# Patient Record
Sex: Male | Born: 1980 | Race: White | Hispanic: No | Marital: Single | Smoking: Never smoker
Health system: Southern US, Community
[De-identification: ages and names within clinical notes are randomized; demographics above are authoritative.]

## PROBLEM LIST (undated history)

## (undated) DIAGNOSIS — I119 Hypertensive heart disease without heart failure: Secondary | ICD-10-CM

## (undated) DIAGNOSIS — I5020 Unspecified systolic (congestive) heart failure: Secondary | ICD-10-CM

## (undated) DIAGNOSIS — I10 Essential (primary) hypertension: Secondary | ICD-10-CM

## (undated) HISTORY — PX: SPIGELIAN HERNIA: SHX6100

## (undated) HISTORY — PX: TONSILLECTOMY AND ADENOIDECTOMY: SHX28

## (undated) HISTORY — DX: Hypertensive heart disease without heart failure: I11.9

## (undated) HISTORY — DX: Unspecified systolic (congestive) heart failure: I50.20

## (undated) HISTORY — DX: Essential (primary) hypertension: I10

---

## 2010-01-12 ENCOUNTER — Emergency Department (HOSPITAL_COMMUNITY): Admission: EM | Admit: 2010-01-12 | Discharge: 2010-01-12 | Payer: Self-pay | Admitting: Emergency Medicine

## 2010-01-12 ENCOUNTER — Encounter: Payer: Self-pay | Admitting: Orthopedic Surgery

## 2010-01-12 IMAGING — CR DG FINGER THUMB 2+V*R*
1 series · 1 of 1 positions shown · non-contrast
Comparison: None.

CLINICAL DATA: Pain following injury [DATE]

RIGHT THUMB 2+V

[view not recorded]
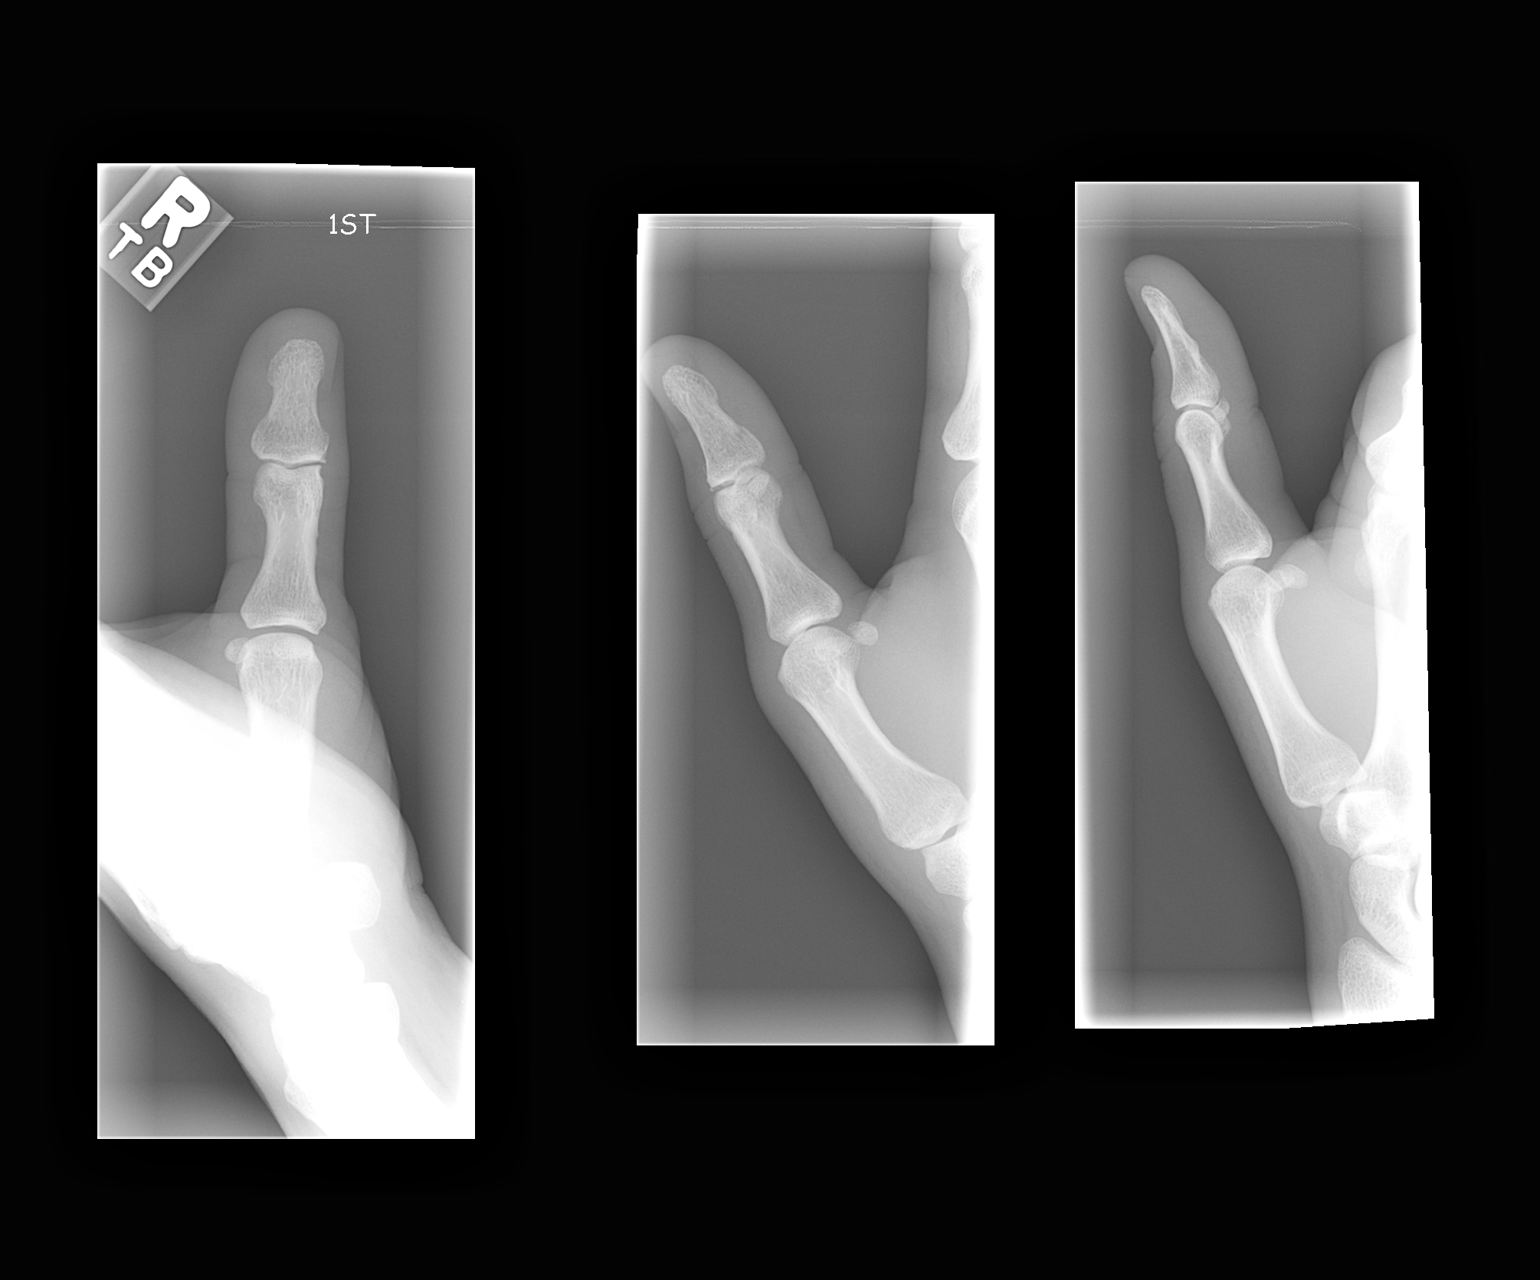

[1 of 1 positions shown; findings below may reference images not displayed]

FINDINGS: No definite fracture or dislocation.  On the oblique
view, there is a possible tiny avulsion off of the base of the
distal phalanx.  This needs clinical correlation.
IMPRESSION: Probably normal but cannot entirely exclude tiny avulsion off the
base of the distal phalanx.  See report.

## 2010-01-17 ENCOUNTER — Ambulatory Visit: Payer: Self-pay | Admitting: Orthopedic Surgery

## 2010-01-17 DIAGNOSIS — S63659A Sprain of metacarpophalangeal joint of unspecified finger, initial encounter: Secondary | ICD-10-CM

## 2010-11-22 NOTE — Assessment & Plan Note (Signed)
Summary: ER/Fractured thumb/xrays ap/self pay/frs   Vital Signs:  Patient profile:   30 year old male Height:      72 inches Weight:      192 pounds Pulse rate:   76 / minute Resp:     16 per minute  Vitals Entered By: Fuller Canada MD (January 17, 2010 8:42 AM)  Visit Type:  new patient Referring Provider:  ap er Primary Provider:  Dr. Arnold Long  CC:  right thumb fracture.  History of Present Illness: Ricardo Evans, 30 years old. Chief complaint pain, RIGHT thumb,. Patient was injured on [address] .basketball. Someone ran into his thumb complains of pain in the interphalangeal joint, as well as the ulnar collateral ligament, which is sharp throbbing constant. He complains of some numbness and tingling and swelling.    Xrays [facility] 01/12/10.  Meds: Norco 5 number 20 from er 01/12/10.  Allergies (verified): 1)  ! Penicillin 2)  ! Sulfa 3)  ! Keflex  Past History:  Past Medical History: na  Past Surgical History: hernia tonsils  Family History: Family History Coronary Heart Disease male < 69  Social History: Patient is single.  student no smoking alcohol occasionally caffeine occasionally  Review of Systems Constitutional:  Denies weight loss, weight gain, fever, chills, and fatigue. Cardiovascular:  Denies chest pain, palpitations, fainting, and murmurs. Respiratory:  Denies short of breath, wheezing, couch, tightness, pain on inspiration, and snoring . Gastrointestinal:  Denies heartburn, nausea, vomiting, diarrhea, constipation, and blood in your stools. Genitourinary:  Denies frequency, urgency, difficulty urinating, painful urination, flank pain, and bleeding in urine. Neurologic:  Denies numbness, tingling, unsteady gait, dizziness, tremors, and seizure. Musculoskeletal:  Complains of joint pain, swelling, and stiffness; denies instability, redness, heat, and muscle pain. Endocrine:  Denies excessive thirst, exessive urination, and heat or cold  intolerance. Psychiatric:  Denies nervousness, depression, anxiety, and hallucinations. Skin:  Denies changes in the skin, poor healing, rash, itching, and redness. HEENT:  Denies blurred or double vision, eye pain, redness, and watering. Immunology:  Denies seasonal allergies, sinus problems, and allergic to bee stings. Hemoatologic:  Denies easy bleeding and brusing.  Physical Exam  Additional Exam:  exam vital signs are normal.  His appearance was normal.  RIGHT thumb examination she reveals tenderness at the interphalangeal joint and tenderness over the ulnar collateral ligament, and loss of motion in the metacarpophalangeal joint. Decreased motion in the interphalangeal joint. The ligament is stable. Flexion power is normal. Pinch strength is diminished.  Sensation is normal, pulses, normal in the wrist, and capillary refill was normal in  the digit     Impression & Recommendations:  Problem # 1:  SPRAIN AND STRAIN OF METACARPOPHALANGEAL OF HAND (ICD-842.12) Assessment New x-rays were taken at the hospital, shows a small, tiny fracture at the margin of the IP joint, question old or new but does correlate with some tenderness in this area suggesting fracture.  Injuries 93 weeks old. I think motion is more important immobilization at this point, recommend active range of motion exercises limit contact activity. Orders: New Patient Level II ([id number])  Medications Added to Medication List This Visit: 1)  Ibuprofen 800 Mg Tabs (Ibuprofen) .Marland Kitchen.. 1 by mouth three times a day 2)  Norco 5-325 Mg Tabs (Hydrocodone-acetaminophen) .Marland Kitchen.. 1 by mouth q 4 as needed pain  Patient Instructions: 1)  Ice it 2 x a day  2)  move the joint as much as possible  3)  Take norco and ibuprofen as needed  4)  dont play any sports for 2-4 weeks (until pain and swelling resolve)  5)  No weight lifting x 4 weeks  6)  Please schedule a follow-up appointment as needed. Prescriptions: NORCO 5-325 MG TABS  (HYDROCODONE-ACETAMINOPHEN) 1 by mouth q 4 as needed pain  #42 x 1   Entered and Authorized by:   Fuller Canada MD   Signed by:   Fuller Canada MD on 01/17/2010   Method used:   Print then Give to Patient   RxID:   [id number] IBUPROFEN 800 MG TABS (IBUPROFEN) 1 by mouth three times a day  #90 x 0   Entered and Authorized by:   Fuller Canada MD   Signed by:   Fuller Canada MD on 01/17/2010   Method used:   Print then Give to Patient   RxID:   [id number]

## 2010-11-22 NOTE — Letter (Signed)
Summary: Out of [facility]  [address]  [place], [place] [address]   Phone: [phone]  Fax: [phone]    January 17, 2010   Student:  Adelene Amas [place]    To Whom It May Concern:   For Medical reasons, please excuse the above named student from school for the following dates:  Start:   January 17, 2010  End/Return to class:    January 17, 2010, following 8:30AM appointment in our office today.   If you need additional information, please feel free to contact our office.   Sincerely,    Terrance Mass, MD    ****This is a legal document and cannot be tampered with.  Schools are authorized to verify all information and to do so accordingly.

## 2010-11-22 NOTE — Letter (Signed)
Summary: Out of PE  [facility]  [address]  [place], [place] [address]   Phone: [phone]  Fax: [phone]    January 17, 2010   Student:  Adelene Amas [place]    To Whom It May Concern:   For Medical reasons, please excuse the above named student from attending sports, including weight lifting for:  4 weeks from the above date. (through February 14, 2010)  If you need additional information, please feel free to contact our office.  Sincerely,    Terrance Mass MD   ****This is a legal document and cannot be tampered with.  Schools are authorized to verify all information and to do so accordingly.

## 2010-11-22 NOTE — Letter (Signed)
Summary: History form  History form   Imported By: Jacklynn Ganong 01/18/2010 14:52:30  _____________________________________________________________________  External Attachment:    Type:   Image     Comment:   External Document

## 2018-04-02 ENCOUNTER — Ambulatory Visit (HOSPITAL_COMMUNITY)
Admission: RE | Admit: 2018-04-02 | Discharge: 2018-04-02 | Disposition: A | Discharge status: Home / Self Care | Payer: Self-pay | Attending: Psychiatry | Admitting: Psychiatry

## 2018-04-02 NOTE — H&P (Signed)
Behavioral Health Medical Screening Exam  Ricardo Evans is an 37 y.o. male patient presents to [facility] as walk in requesting resources for therapy.  Patient denies suicidal/self-harm/homicidal ideation, psychosis, and paranoia   Total Time spent with patient: 45 minutes  Psychiatric Specialty Exam: Physical Exam  Constitutional: He is oriented to person, place, and time. He appears well-developed and well-nourished.  HENT:  Head: Normocephalic.  Neck: Normal range of motion. Neck supple.  Cardiovascular:  Elevated blood pressure and tachycardia.  Patient states that he takes Lisinopril daily in the evenings and has not taken it today and that he just finished working out.   Respiratory: Effort normal.  Musculoskeletal: Normal range of motion.  Neurological: He is alert and oriented to person, place, and time.  Skin: Skin is warm and dry.  Psychiatric: He has a normal mood and affect. His speech is normal and behavior is normal. Judgment and thought content normal. Cognition and memory are normal.    Review of Systems  Constitutional: Negative.   Respiratory: Negative for cough, shortness of breath and wheezing.   Cardiovascular: Negative for chest pain, orthopnea, claudication, leg swelling and PND.       Elevated blood pressure has been on medication for 15 days and is suppose to follow up with Dr. Dimas Aguas next week.  Patient resting and has taken his blood pressure medication while sitting here; Informed that he needs to follow up with his PCP today  Psychiatric/Behavioral: Negative for hallucinations, memory loss, substance abuse and suicidal ideas. Depression: some depression but stable. The patient does not have insomnia. Nervous/anxious: Some anxiety but statble.        States that he is looking for resources for therapy     Blood pressure (!) 193/172, pulse (!) 124, temperature 98.3 F (36.8 C), resp. rate 18, SpO2 100 %.There is no height or weight on file to calculate BMI.   General Appearance: Casual  Eye Contact:  Good  Speech:  Clear and Coherent and Normal Rate  Volume:  Normal  Mood:  Appropriate  Affect:  Appropriate and Congruent  Thought Process:  Coherent and Goal Directed  Orientation:  Full (Time, Place, and Person)  Thought Content:  Logical  Suicidal Thoughts:  No  Homicidal Thoughts:  No  Memory:  Immediate;   Good Recent;   Good Remote;   Good  Judgement:  Intact  Insight:  Present  Psychomotor Activity:  Normal  Concentration: Concentration: Good and Attention Span: Good  Recall:  Good  Fund of Knowledge:Good  Language: Good  Akathisia:  No  Handed:  Right  AIMS (if indicated):     Assets:  Communication Skills Desire for Improvement Housing Leisure Time Social Support Transportation  Sleep:       Musculoskeletal: Strength & Muscle Tone: within normal limits Gait & Station: normal Patient leans: N/A  Blood pressure (!) 193/172, pulse (!) 124, temperature 98.3 F (36.8 C), resp. rate 18, SpO2 100 %.  Recommendations:  Resources for outpatient psychiatric treatment (therapy).  Patient to follow up with his PCP; Dr. Dimas Aguas today related to elevated blood pressure.  Patient educate on the importance and the seriousness of blood pressure this elevated if unable to see PCP will need to go to ED  Based on my evaluation the patient does not appear to have an emergency medical condition.   Recommendations:  Disposition: No evidence of imminent risk to self or others at present.   Patient does not meet criteria for psychiatric inpatient  admission.  Shuvon Rankin, NP 04/02/2018, 3:43 PM

## 2018-04-02 NOTE — BH Assessment (Signed)
Assessment Note  Ricardo Evans is an 37 y.o. male.  [facility] reporting: "I'm a little down about a lot of things and I just want to talk."  Pt reports he had a conflict with his fiancee today and she recommended he come.  Also reports he still thinks about his mother and brother who both committed suicide, one in 2007, one in 1999.  Upon further discussion, pt would like to start meeting with an outpt therapist.  Pt reports he does feel depressed but did not endorse any symptoms of depression.  Pt denied SI/HI/AV.  Pt has no current treatment but was in therapy after his mother's death in 2007.  Pt reports alcohol use <1x per week.    Diagnosis: deferred  Past Medical History: No past medical history on file.    Family History: No family history on file.  Social History:  has no tobacco, alcohol, and drug history on file.  Additional Social History:  Alcohol / Drug Use Pain Medications: pt denies Prescriptions: pt denies Over the Counter: pt denies History of alcohol / drug use?: Yes Negative Consequences of Use: Legal(one DUI 2007) Substance #1 Name of Substance 1: alcohol 1 - Age of First Use: 15 1 - Amount (size/oz): 1-2 beers 1 - Frequency: <1x per week 1 - Last Use / Amount: 2 weeks ago  CIWA: CIWA-Ar BP: (!) 193/172 Pulse Rate: (!) 124 COWS:    Allergies:  Allergies  Allergen Reactions  . Cephalexin   . Penicillins   . Sulfonamide Derivatives     Home Medications:  (Not in a hospital admission)  OB/GYN Status:  No LMP for male patient.  General Assessment Data TTS Assessment: In system Is this a Tele or Face-to-Face Assessment?: Face-to-Face Is this an Initial Assessment or a Re-assessment for this encounter?: Initial Assessment Marital status: Single Referral Source: Self/Family/Friend Insurance type: self pay  Medical Screening Exam [facility]) Medical Exam completed: Yes  Crisis Care Plan Name of Psychiatrist: none Name of  Therapist: none  Education Status Is patient currently in school?: No Is the patient employed, unemployed or receiving disability?: Employed  Risk to self with the past 6 months Suicidal Ideation: No Has patient been a risk to self within the past 6 months prior to admission? : No Suicidal Intent: No Has patient had any suicidal intent within the past 6 months prior to admission? : No Is patient at risk for suicide?: No Suicidal Plan?: No Has patient had any suicidal plan within the past 6 months prior to admission? : No Access to Means: No What has been your use of drugs/alcohol within the last 12 months?: minimal alcohol use Previous Attempts/Gestures: No Intentional Self Injurious Behavior: None Family Suicide History: Yes(mother and brother ) Recent stressful life event(s): Conflict (Comment)(with fiancee) Persecutory voices/beliefs?: No Depression: No Substance abuse history and/or treatment for substance abuse?: No Suicide prevention information given to non-admitted patients: (none available)  Risk to Others within the past 6 months Homicidal Ideation: No Does patient have any lifetime risk of violence toward others beyond the six months prior to admission? : No Thoughts of Harm to Others: No Current Homicidal Intent: No Current Homicidal Plan: No Access to Homicidal Means: No History of harm to others?: No Does patient have access to weapons?: No Criminal Charges Pending?: No Does patient have a court date: No Is patient on probation?: No  Psychosis Hallucinations: None noted Delusions: None noted  Mental Status Report Appearance/Hygiene:  Unremarkable Eye Contact: Good Motor Activity: Unremarkable Speech: Logical/coherent Level of Consciousness: Alert Mood: Pleasant Affect: Appropriate to circumstance Anxiety Level: None Thought Processes: Coherent, Relevant Judgement: Unimpaired Orientation: Person, Place, Time, Situation Obsessive Compulsive  Thoughts/Behaviors: None  Cognitive Functioning Concentration: Normal Memory: Recent Intact, Remote Intact Is patient IDD: No Is patient DD?: No Insight: Good Impulse Control: Good Appetite: Good Have you had any weight changes? : Loss Amount of the weight change? (lbs): 7 lbs Sleep: No Change Total Hours of Sleep: 9 Vegetative Symptoms: None  ADLScreening [facility] Assessment Services) Patient's cognitive ability adequate to safely complete daily activities?: Yes Patient able to express need for assistance with ADLs?: Yes Independently performs ADLs?: Yes (appropriate for developmental age)  Prior Inpatient Therapy Prior Inpatient Therapy: No  Prior Outpatient Therapy Prior Outpatient Therapy: Yes Prior Therapy Dates: 2007(after mother's suicide) Prior Therapy Facilty/Provider(s): pt did not remember Reason for Treatment: grief/loss Does patient have an ACCT team?: No Does patient have Intensive In-House Services?  : No Does patient have [facility] services? : No Does patient have P4[facility] services?: No  ADL Screening (condition at time of admission) Patient's cognitive ability adequate to safely complete daily activities?: Yes Patient able to express need for assistance with ADLs?: Yes Independently performs ADLs?: Yes (appropriate for developmental age)       Abuse/Neglect Assessment (Assessment to be complete while patient is alone) Abuse/Neglect Assessment Can Be Completed: Yes Physical Abuse: Denies Verbal Abuse: Denies Sexual Abuse: Denies Exploitation of patient/patient's resources: Denies Self-Neglect: Denies     Merchant [employer] officer (For Healthcare) Does Patient Have a Medical Advance Directive?: Yes Does patient want to make changes to medical advance directive?: No - Patient declined Type of Advance Directive: Healthcare Power of Attorney Copy of Healthcare Power of Attorney in Chart?: No - copy requested    Additional Information 1:1 In Past 12 Months?:  No CIRT Risk: No Elopement Risk: No Does patient have medical clearance?: Yes     Disposition: TTS discussed this pt with Shuvon Rankin, NP, who reports pt does not meet inpt criteria.  Pt given outpt resources and TTS discussed [facility] with pt as he lives in [place]. Pt also recommended to contact his PCP due to elevated blood pressure. Disposition Initial Assessment Completed for this Encounter: Yes Disposition of Patient: Discharge Patient refused recommended treatment: No Mode of transportation if patient is discharged?: Car Patient referred to: Other (Comment)([facility] [place])  On Site Evaluation by:   Reviewed with Physician:    Lorri Frederick 04/02/2018 4:06 PM

## 2020-05-30 ENCOUNTER — Inpatient Hospital Stay (HOSPITAL_COMMUNITY)
Admission: EM | Admit: 2020-05-30 | Discharge: 2020-06-02 | DRG: 304 | Disposition: A | Discharge status: Home / Self Care | Payer: Self-pay | Attending: Family Medicine | Admitting: Family Medicine

## 2020-05-30 ENCOUNTER — Emergency Department (HOSPITAL_COMMUNITY): Payer: Self-pay

## 2020-05-30 ENCOUNTER — Other Ambulatory Visit: Payer: Self-pay

## 2020-05-30 ENCOUNTER — Observation Stay (HOSPITAL_COMMUNITY): Payer: Self-pay

## 2020-05-30 ENCOUNTER — Encounter (HOSPITAL_COMMUNITY): Payer: Self-pay

## 2020-05-30 DIAGNOSIS — I160 Hypertensive urgency: Principal | ICD-10-CM | POA: Diagnosis present

## 2020-05-30 DIAGNOSIS — R0600 Dyspnea, unspecified: Secondary | ICD-10-CM | POA: Diagnosis present

## 2020-05-30 DIAGNOSIS — Z880 Allergy status to penicillin: Secondary | ICD-10-CM

## 2020-05-30 DIAGNOSIS — J209 Acute bronchitis, unspecified: Secondary | ICD-10-CM | POA: Diagnosis present

## 2020-05-30 DIAGNOSIS — I43 Cardiomyopathy in diseases classified elsewhere: Secondary | ICD-10-CM | POA: Diagnosis present

## 2020-05-30 DIAGNOSIS — R590 Localized enlarged lymph nodes: Secondary | ICD-10-CM

## 2020-05-30 DIAGNOSIS — Z87891 Personal history of nicotine dependence: Secondary | ICD-10-CM

## 2020-05-30 DIAGNOSIS — Z881 Allergy status to other antibiotic agents status: Secondary | ICD-10-CM

## 2020-05-30 DIAGNOSIS — Z20822 Contact with and (suspected) exposure to covid-19: Secondary | ICD-10-CM | POA: Diagnosis present

## 2020-05-30 DIAGNOSIS — R0689 Other abnormalities of breathing: Secondary | ICD-10-CM | POA: Diagnosis present

## 2020-05-30 DIAGNOSIS — J9601 Acute respiratory failure with hypoxia: Secondary | ICD-10-CM | POA: Diagnosis present

## 2020-05-30 DIAGNOSIS — E876 Hypokalemia: Secondary | ICD-10-CM | POA: Diagnosis present

## 2020-05-30 DIAGNOSIS — I110 Hypertensive heart disease with heart failure: Secondary | ICD-10-CM | POA: Diagnosis present

## 2020-05-30 DIAGNOSIS — F419 Anxiety disorder, unspecified: Secondary | ICD-10-CM | POA: Diagnosis present

## 2020-05-30 DIAGNOSIS — Z882 Allergy status to sulfonamides status: Secondary | ICD-10-CM

## 2020-05-30 DIAGNOSIS — I5022 Chronic systolic (congestive) heart failure: Secondary | ICD-10-CM | POA: Diagnosis present

## 2020-05-30 LAB — CBC WITH DIFFERENTIAL/PLATELET
Abs Immature Granulocytes: 0.03 10*3/uL (ref 0.00–0.07)
Basophils Absolute: 0 10*3/uL (ref 0.0–0.1)
Basophils Relative: 0 %
Eosinophils Absolute: 0 10*3/uL (ref 0.0–0.5)
Eosinophils Relative: 0 %
HCT: 42.4 % (ref 39.0–52.0)
Hemoglobin: 14.2 g/dL (ref 13.0–17.0)
Immature Granulocytes: 0 %
Lymphocytes Relative: 13 %
Lymphs Abs: 0.9 10*3/uL (ref 0.7–4.0)
MCH: 30.8 pg (ref 26.0–34.0)
MCHC: 33.5 g/dL (ref 30.0–36.0)
MCV: 92 fL (ref 80.0–100.0)
Monocytes Absolute: 0.6 10*3/uL (ref 0.1–1.0)
Monocytes Relative: 9 %
Neutro Abs: 5.4 10*3/uL (ref 1.7–7.7)
Neutrophils Relative %: 78 %
Platelets: 171 10*3/uL (ref 150–400)
RBC: 4.61 MIL/uL (ref 4.22–5.81)
RDW: 13.7 % (ref 11.5–15.5)
WBC: 7 10*3/uL (ref 4.0–10.5)
nRBC: 0 % (ref 0.0–0.2)

## 2020-05-30 LAB — BRAIN NATRIURETIC PEPTIDE: B Natriuretic Peptide: 265 pg/mL — ABNORMAL HIGH (ref 0.0–100.0)

## 2020-05-30 LAB — COMPREHENSIVE METABOLIC PANEL
ALT: 23 U/L (ref 0–44)
AST: 24 U/L (ref 15–41)
Albumin: 4.4 g/dL (ref 3.5–5.0)
Alkaline Phosphatase: 85 U/L (ref 38–126)
Anion gap: 13 (ref 5–15)
BUN: 12 mg/dL (ref 6–20)
CO2: 25 mmol/L (ref 22–32)
Calcium: 8.7 mg/dL — ABNORMAL LOW (ref 8.9–10.3)
Chloride: 98 mmol/L (ref 98–111)
Creatinine, Ser: 1.23 mg/dL (ref 0.61–1.24)
GFR calc Af Amer: >60 mL/min (ref >60)
GFR calc non Af Amer: >60 mL/min (ref >60)
Glucose, Bld: 126 mg/dL — ABNORMAL HIGH (ref 70–99)
Potassium: 3.1 mmol/L — ABNORMAL LOW (ref 3.5–5.1)
Sodium: 136 mmol/L (ref 135–145)
Total Bilirubin: 1.5 mg/dL — ABNORMAL HIGH (ref 0.3–1.2)
Total Protein: 8.2 g/dL — ABNORMAL HIGH (ref 6.5–8.1)

## 2020-05-30 LAB — SARS CORONAVIRUS 2 BY RT PCR (HOSPITAL ORDER, PERFORMED IN ~~LOC~~ HOSPITAL LAB): SARS Coronavirus 2: NEGATIVE

## 2020-05-30 LAB — TROPONIN I (HIGH SENSITIVITY)
Troponin I (High Sensitivity): 42 ng/L — ABNORMAL HIGH (ref <18)
Troponin I (High Sensitivity): 39 ng/L — ABNORMAL HIGH (ref <18)

## 2020-05-30 LAB — MAGNESIUM: Magnesium: 1.7 mg/dL (ref 1.7–2.4)

## 2020-05-30 LAB — HIV ANTIBODY (ROUTINE TESTING W REFLEX): HIV Screen 4th Generation wRfx: NONREACTIVE

## 2020-05-30 IMAGING — CT CT ANGIO CHEST
2 of 6 series · 17 of 46 positions shown · IV contrast (Omnipaque or Isovue)
Comparison: Chest radiograph from earlier today.

CLINICAL DATA: Dyspnea, worsened with exertion.  Hypoxia.

EXAM:
CT ANGIOGRAPHY CHEST WITH CONTRAST
TECHNIQUE: Multidetector CT imaging of the chest was performed using the
standard protocol during bolus administration of intravenous
contrast. Multiplanar CT image reconstructions and MIPs were
obtained to evaluate the vascular anatomy.
CONTRAST:  75mL OMNIPAQUE IOHEXOL 350 MG/ML SOLN

[Series 5: pe axial thins · axial · 0.84mm/px · z∈[+1122,+1399]mm · 14 of 305 slices shown]
[im 14/305  lung]
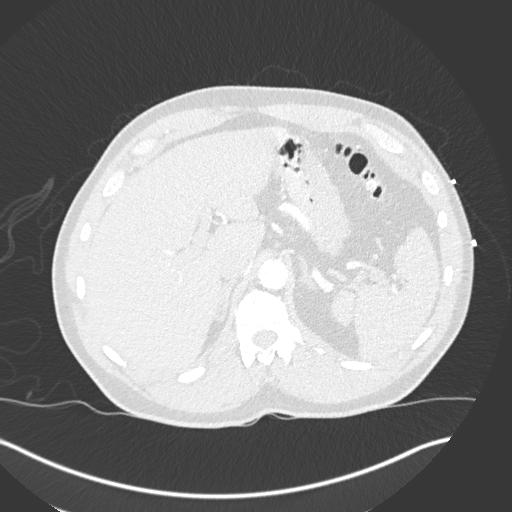
[im 40/305  soft-tissue]
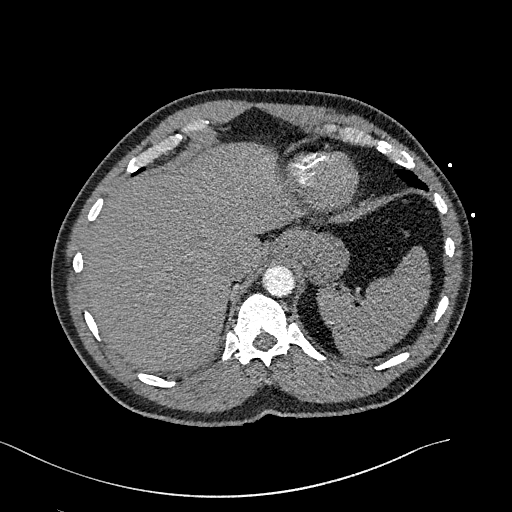
[im 53/305  lung]
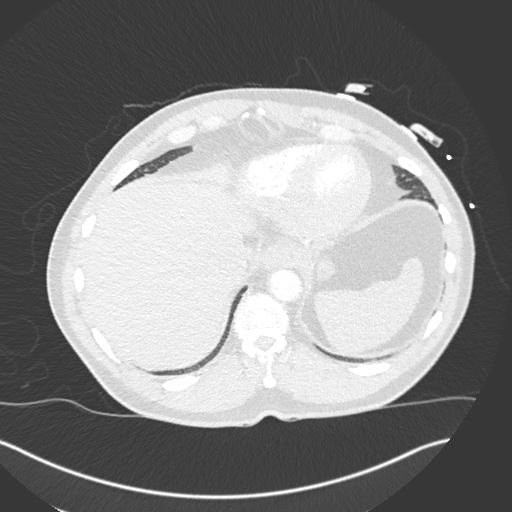
[im 80/305  soft-tissue]
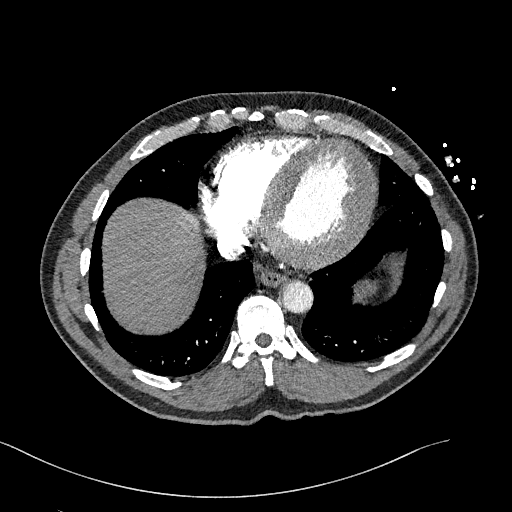
[im 106/305  lung]
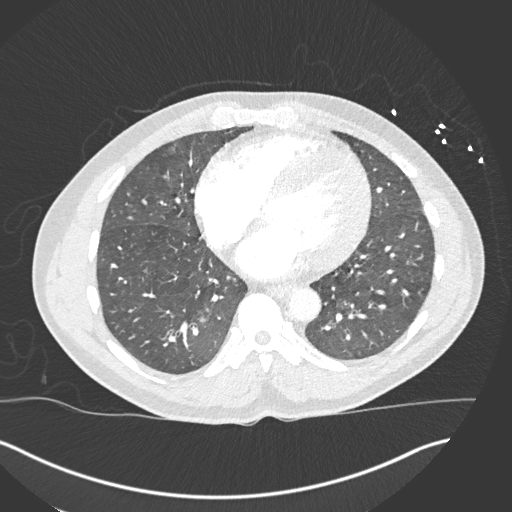
[im 119/305  soft-tissue]
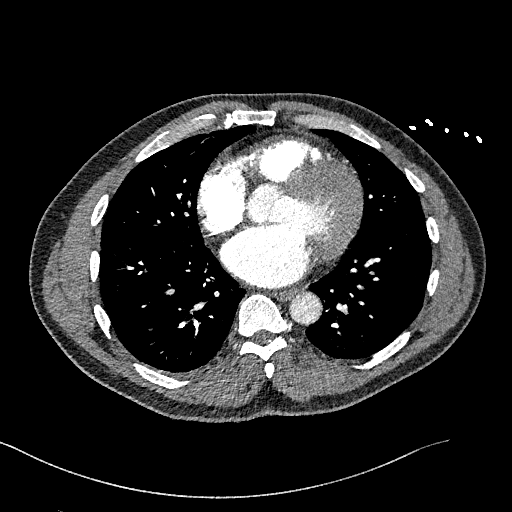
[im 146/305  lung]
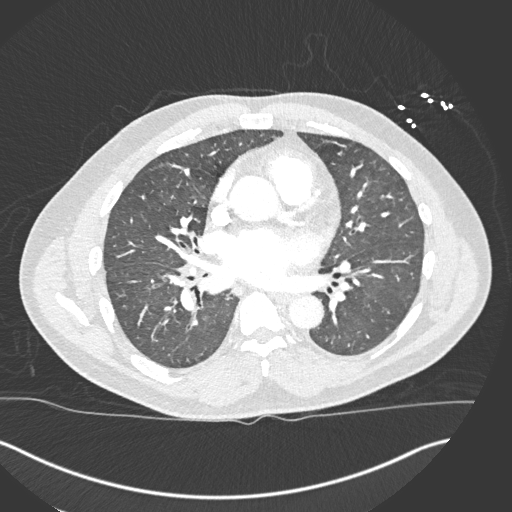
[im 159/305  soft-tissue]
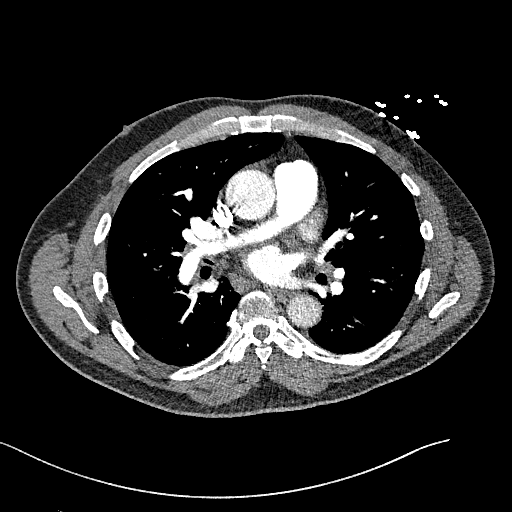
[im 186/305  lung]
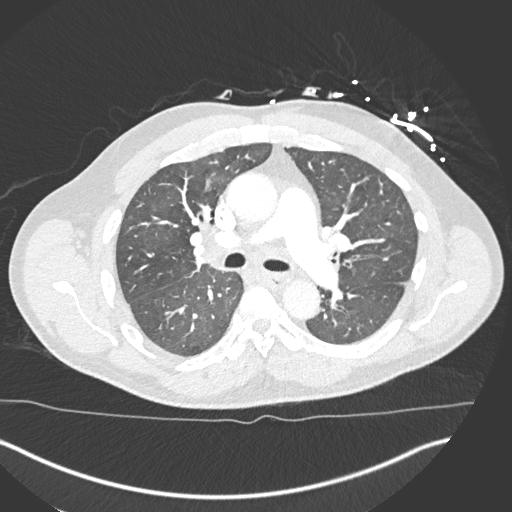
[im 199/305  soft-tissue]
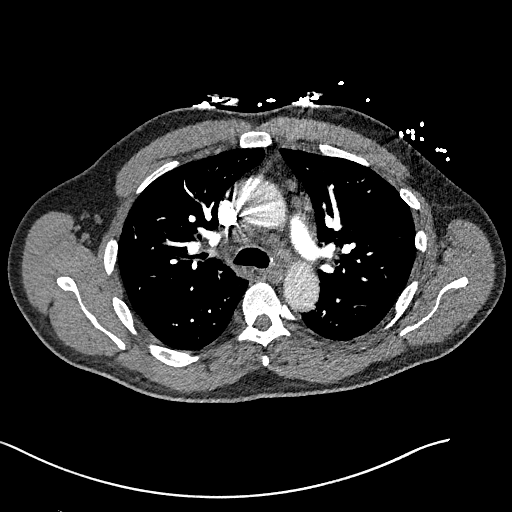
[im 225/305  lung]
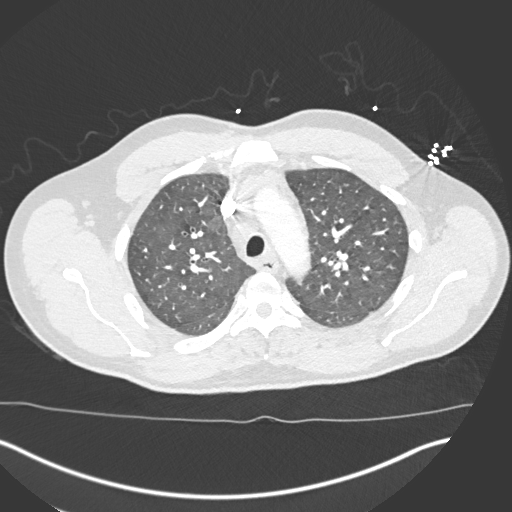
[im 252/305  soft-tissue]
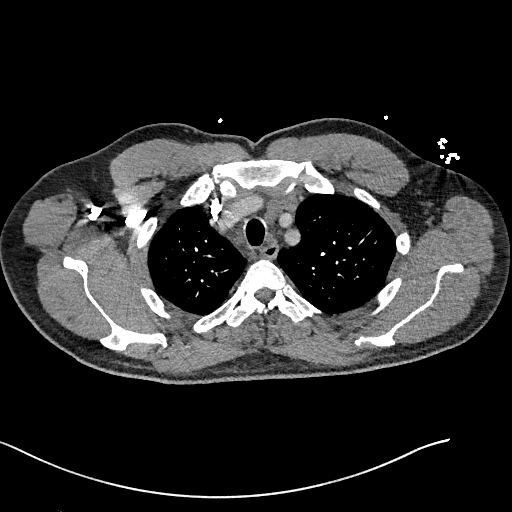
[im 265/305  lung]
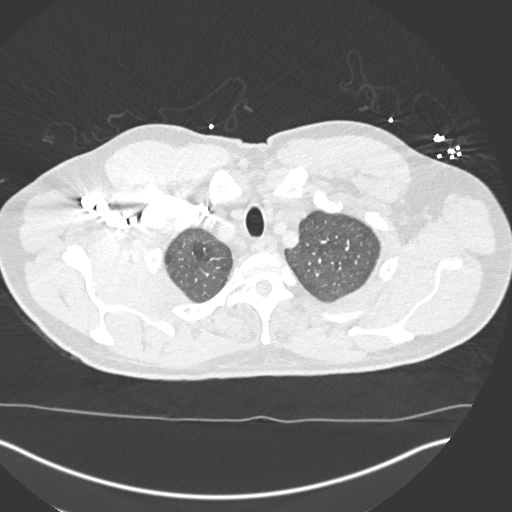
[im 291/305  soft-tissue]
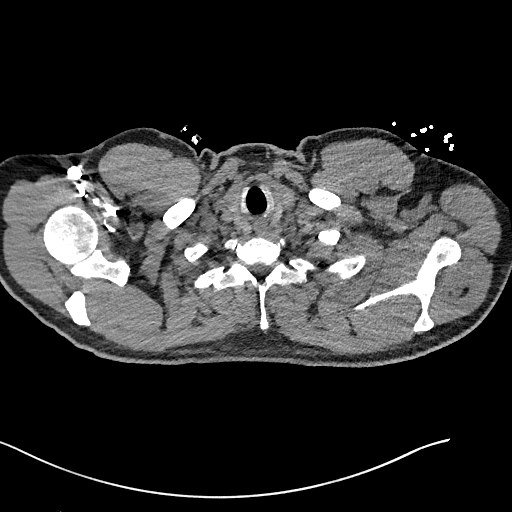

[Series 7: cor soft · coronal · 0.63mm/px · 3 of 151 slices shown]
[im 38/151  soft-tissue]
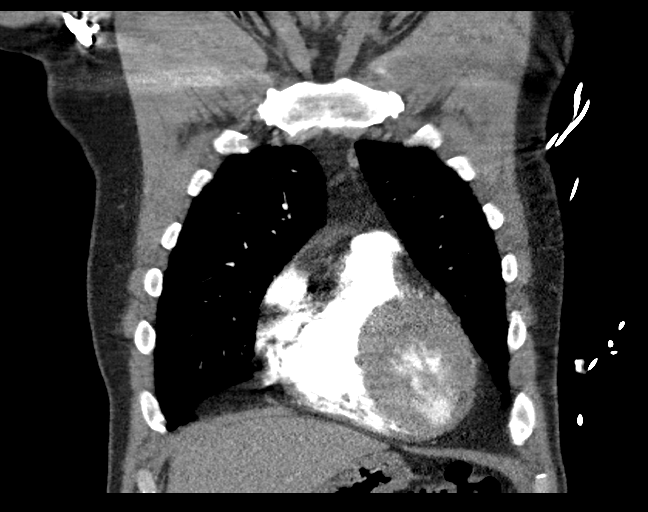
[im 76/151  soft-tissue]
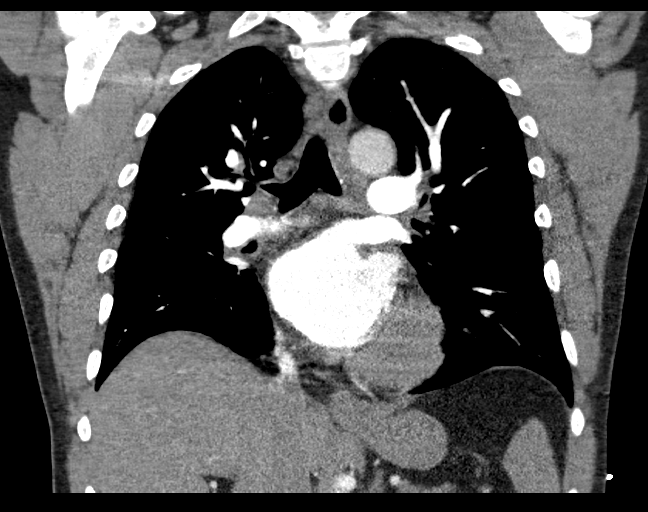
[im 113/151  soft-tissue]
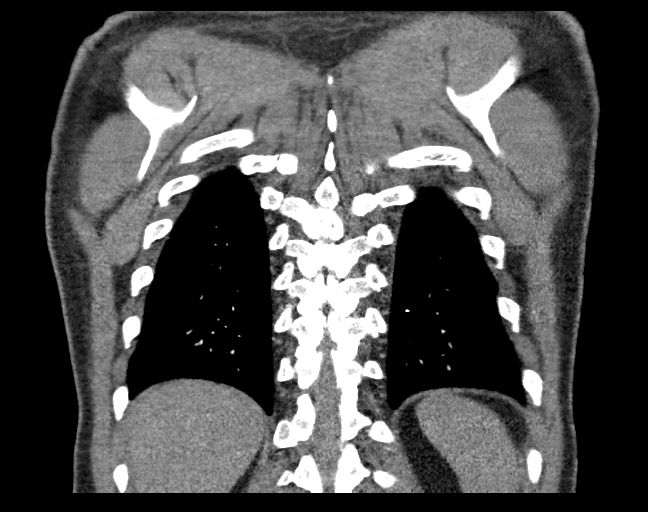

[17 of 46 positions shown; findings below may reference images not displayed]

FINDINGS: Cardiovascular: The study is high quality for the evaluation of
pulmonary embolism. There are no filling defects in the central,
lobar, segmental or subsegmental pulmonary artery branches to
suggest acute pulmonary embolism. Minimally atherosclerotic thoracic
aorta with ectatic 4.2 cm ascending thoracic aorta. Dilated main
pulmonary artery (3.6 cm diameter). Top-normal heart size. No
significant pericardial fluid/thickening.

Mediastinum/Nodes: No discrete thyroid nodules. Unremarkable
esophagus. No axillary adenopathy. Multiple mildly enlarged
paratracheal, left prevascular, subcarinal and bilateral hilar
noncalcified lymph nodes. Representative 1.4 cm right paratracheal
node (series 4/image 29). Representative 1.6 cm subcarinal node
(series 4/image 44). Representative 1.4 cm right hilar node (series
4/image 41).

Lungs/Pleura: No pneumothorax. No pleural effusion. No acute
consolidative airspace disease, lung masses or significant pulmonary
nodules. Diffuse bronchial wall thickening. Mild mosaic attenuation
throughout both lungs.

Upper abdomen: No acute abnormality.

Musculoskeletal:  No aggressive appearing focal osseous lesions.

Review of the MIP images confirms the above findings.
IMPRESSION: 1. No pulmonary embolism.
2. Mild mediastinal and bilateral hilar lymphadenopathy,
nonspecific. Differential include sarcoidosis or lymphoma.
Management options include chest CT with IV contrast surveillance in
3 months tissue sampling or PET-CT, as clinically warranted. Suggest
pulmonology consultation.
3. Dilated main pulmonary artery, suggesting pulmonary arterial
hypertension.
4. Diffuse bronchial wall thickening, nonspecific, as can be seen
with bronchitis or reactive airways disease.
5. Mild mosaic attenuation throughout both lungs, nonspecific,
differential includes mosaic perfusion from pulmonary vascular
disease versus air trapping from small airways disease.
6. Ectatic 4.2 cm ascending thoracic aorta. Recommend annual imaging
followup by CTA or MRA. This recommendation follows [68]
ACCF/AHA/AATS/ACR/ASA/SCA/PRPA/PRPA/PRPA/PRPA Guidelines for the
Diagnosis and Management of Patients with Thoracic Aortic Disease.
Circulation. [68]; 121: [id number]. Aortic aneurysm NOS
([68]-[68]).
7. Aortic Atherosclerosis ([68]-[68]).

## 2020-05-30 IMAGING — CT CT HEAD W/O CM
3 series · 16 of 47 positions shown, 19 images · non-contrast
Comparison: None.

CLINICAL DATA: Worsening headache and fatigue, hypertension

EXAM:
CT HEAD WITHOUT CONTRAST
TECHNIQUE: Contiguous axial images were obtained from the base of the skull
through the vertex without intravenous contrast.

[Series 2: head w o · axial · 0.43mm/px · z∈[-105,+25]mm · 10 of 32 slices shown, 13 images]
[im 3/32  brain]
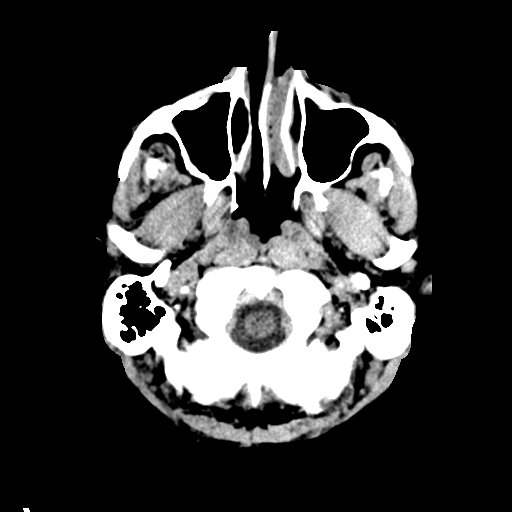
[im 3/32  bone]
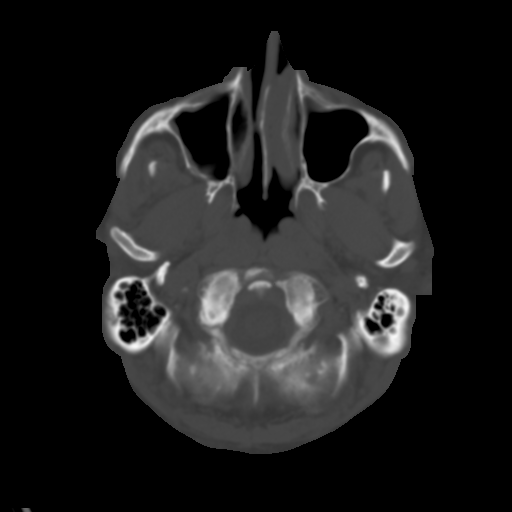
[im 6/32  brain]
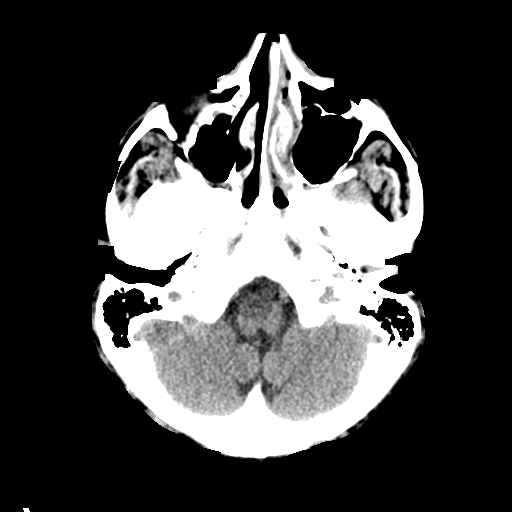
[im 9/32  brain]
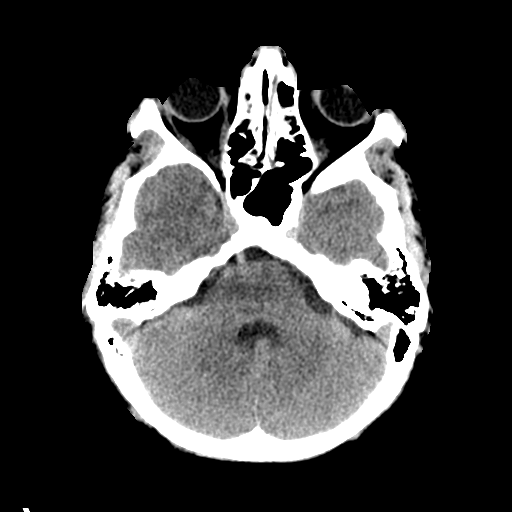
[im 11/32  brain]
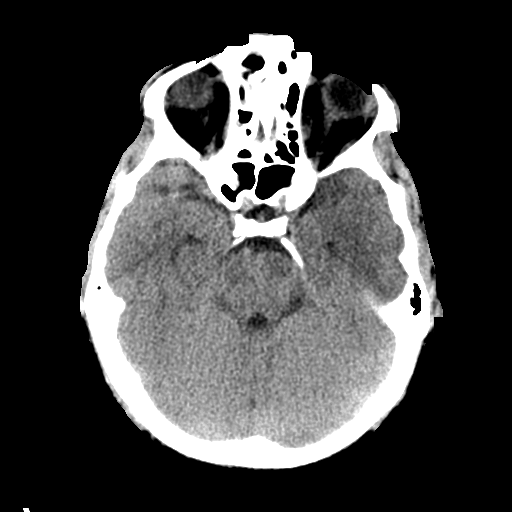
[im 14/32  brain]
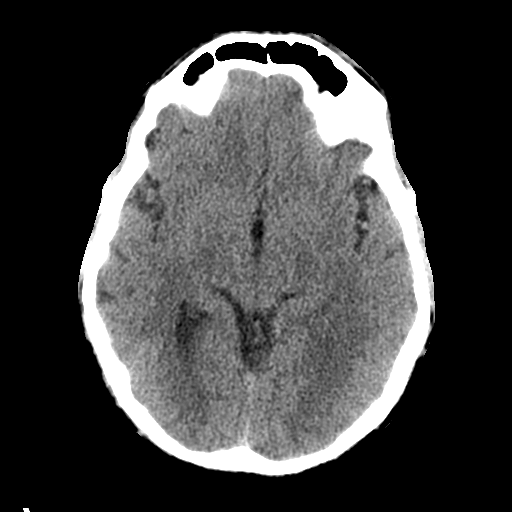
[im 14/32  bone]
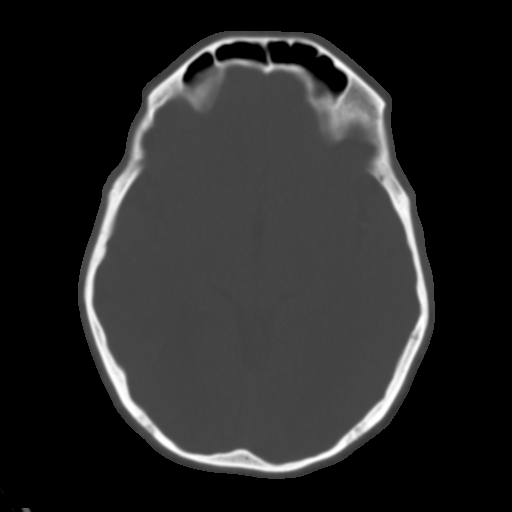
[im 18/32  brain]
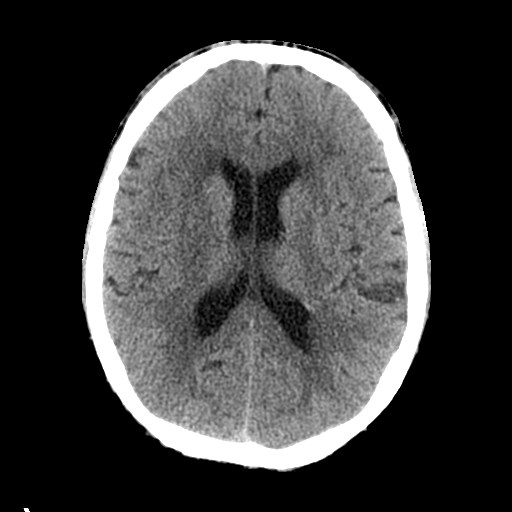
[im 21/32  brain]
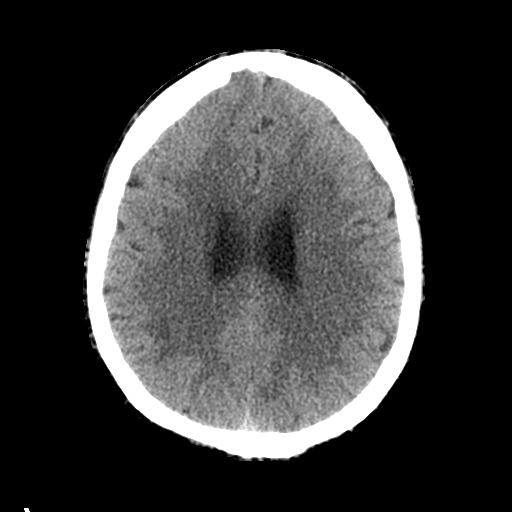
[im 24/32  brain]
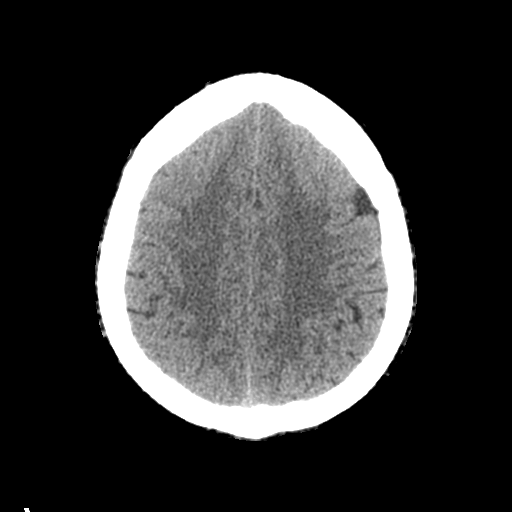
[im 26/32  brain]
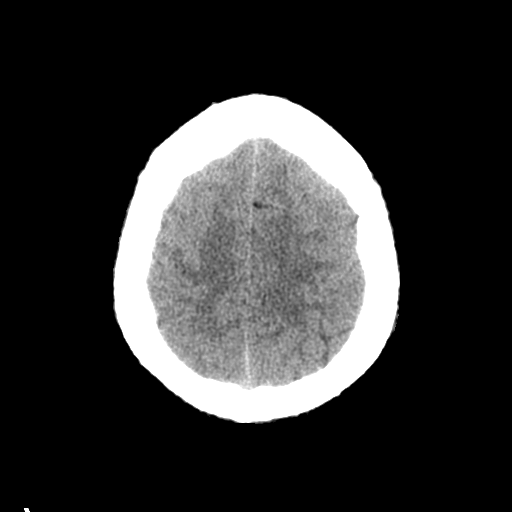
[im 26/32  bone]
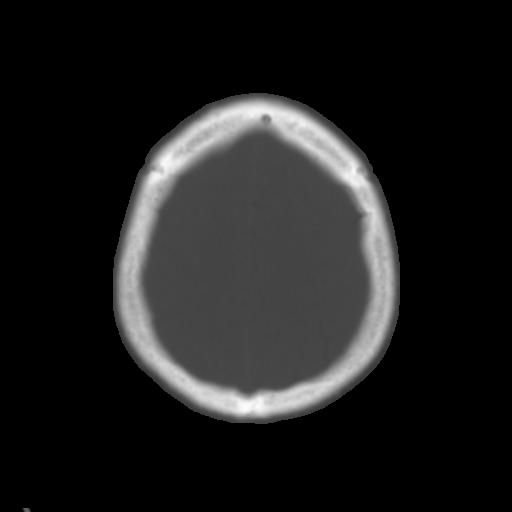
[im 29/32  brain]
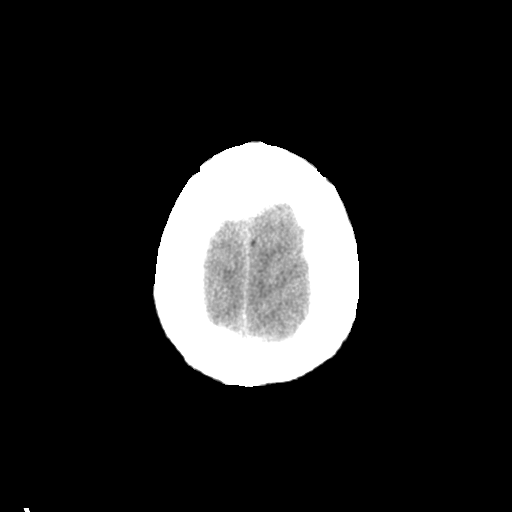

[Series 4: coronal soft · coronal · 0.37mm/px · 3 of 69 slices shown]
[im 23/69  brain]
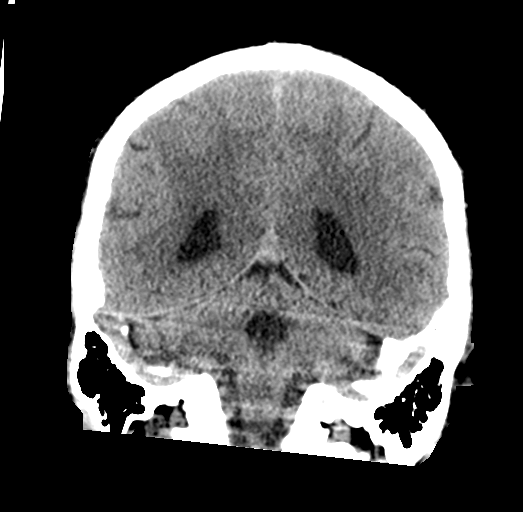
[im 31/69  brain]
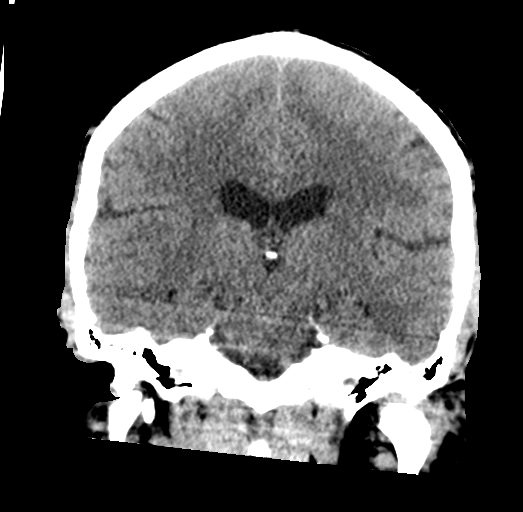
[im 38/69  brain]
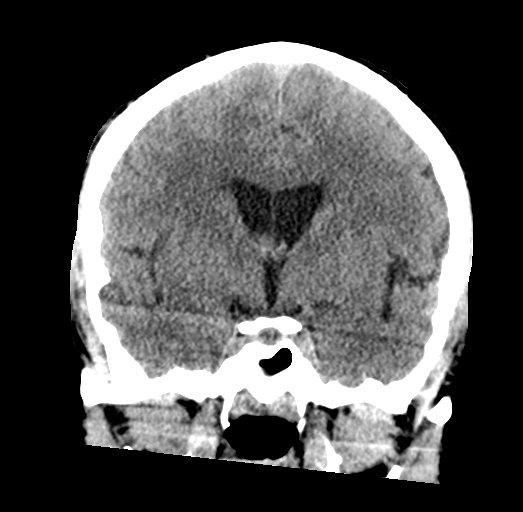

[Series 5: sagittal soft · sagittal · 0.37mm/px · 3 of 60 slices shown]
[im 20/60  brain]
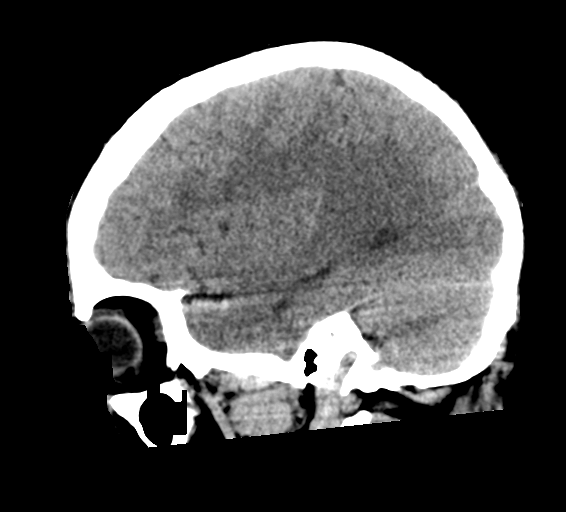
[im 30/60  brain]
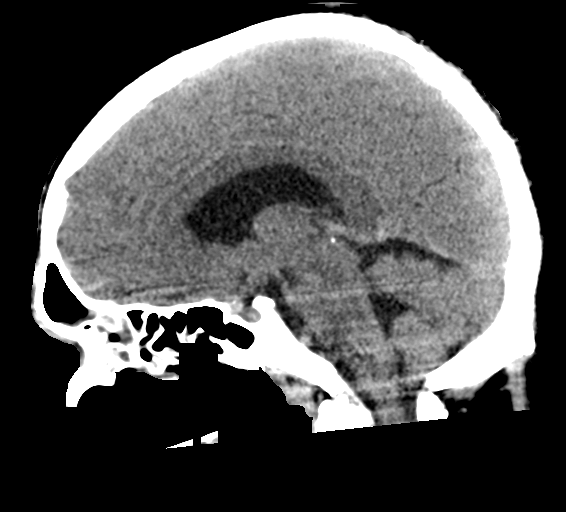
[im 40/60  brain]
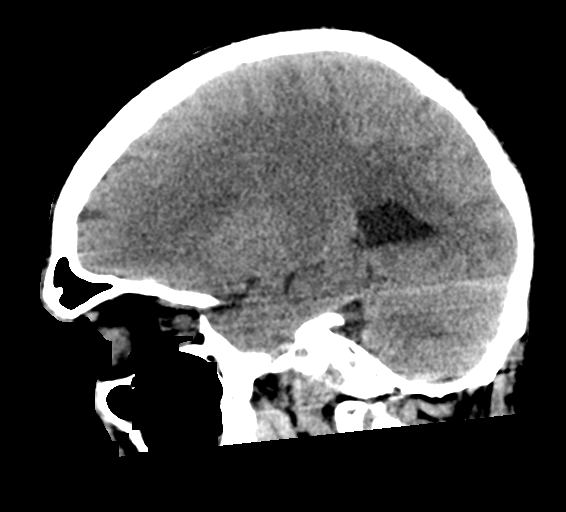

[16 of 47 positions shown; findings below may reference images not displayed]

FINDINGS: Brain: No evidence of acute infarction, hemorrhage, hydrocephalus,
extra-axial collection or mass lesion/mass effect. Periventricular
and deep white matter hypodensity (series 2, image 20).

Vascular: No hyperdense vessel or unexpected calcification.

Skull: Normal. Negative for fracture or focal lesion.

Sinuses/Orbits: No acute finding.

Other: None.
IMPRESSION: 1.  No acute intracranial pathology.

2. Periventricular and deep white matter hypodensity, which may
reflect hypertensive small-vessel white matter disease, but is
significantly advanced for patient age. Demyelinating disorder such
as multiple sclerosis is a differential consideration. Consider
contrast enhanced MRI of the brain and cervical spine to further
evaluate on a nonemergent basis if warranted by clinical suspicion
based on signs and symptoms.

## 2020-05-30 IMAGING — DX DG CHEST 1V PORT
1 series · 1 of 1 positions shown · non-contrast
Comparison: None.

CLINICAL DATA: Shortness of breath

EXAM:
PORTABLE CHEST 1 VIEW

[chest ap]
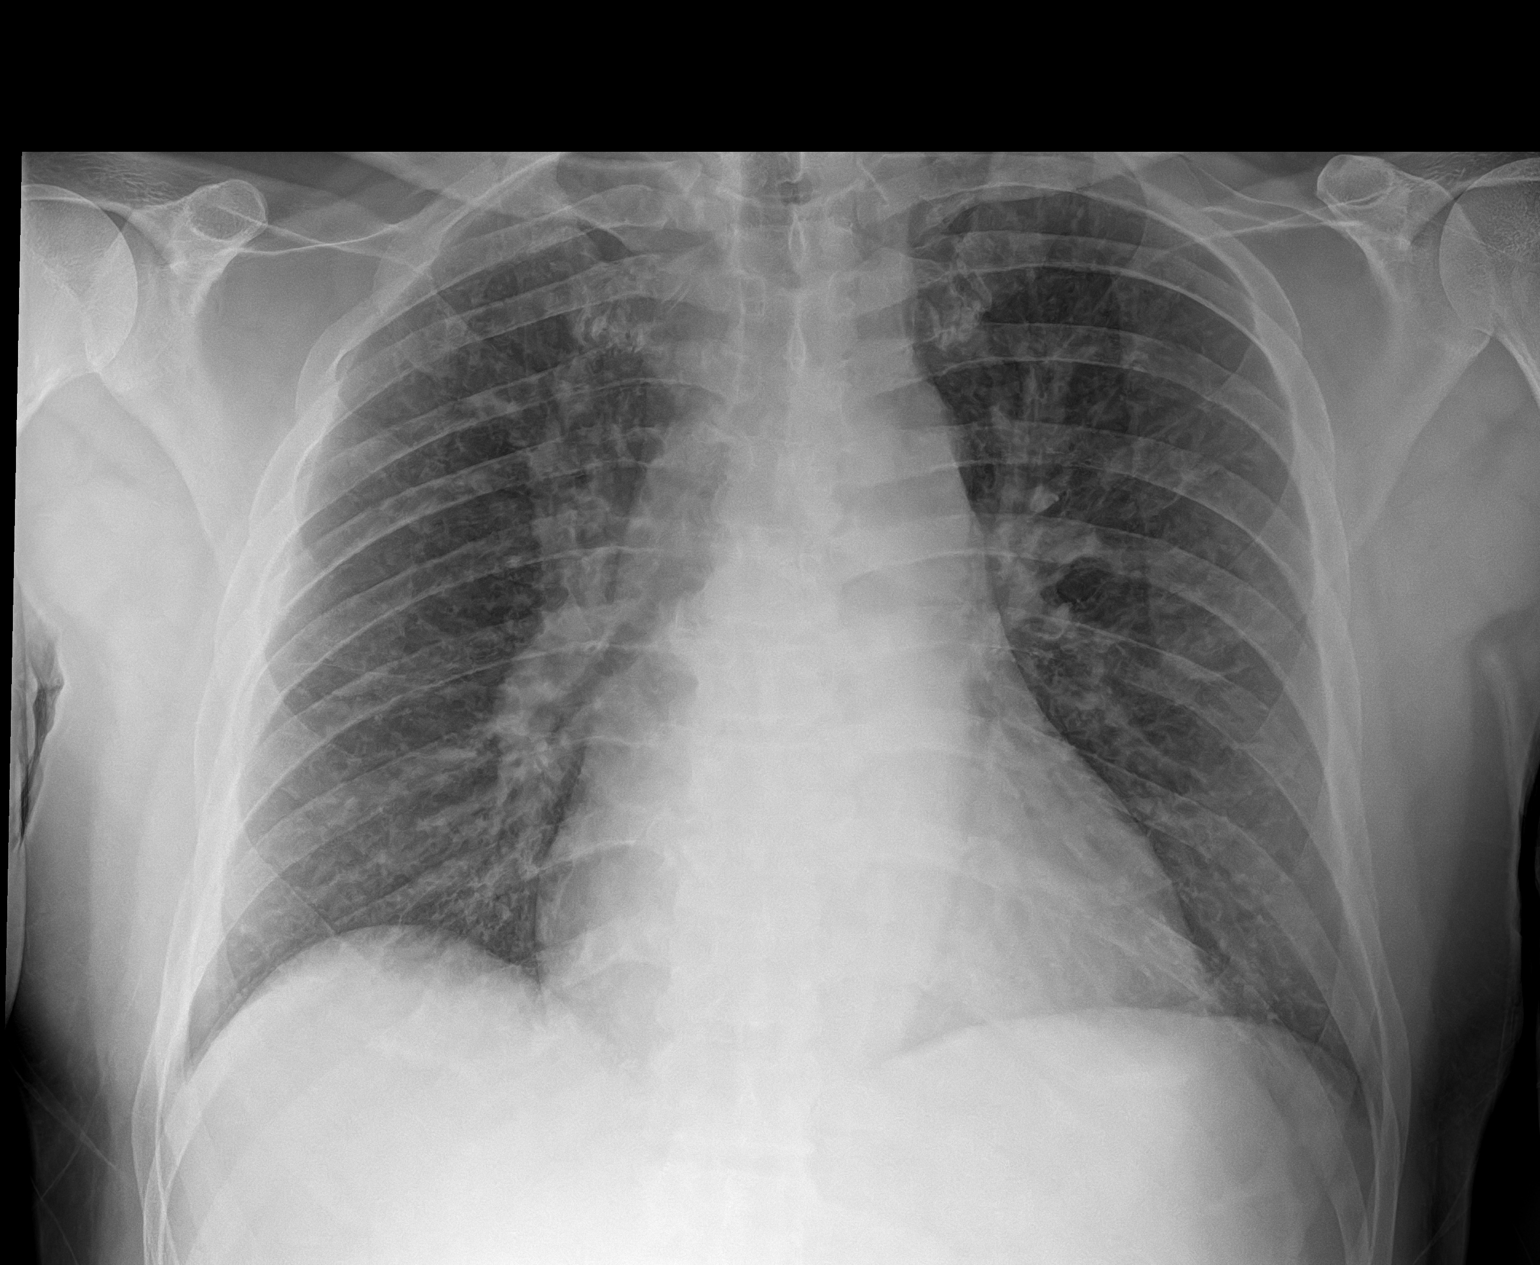

[1 of 1 positions shown; findings below may reference images not displayed]

FINDINGS: The heart size and mediastinal contours are within normal limits.
Mild pulmonary vascular prominence. Both lungs are clear. The
visualized skeletal structures are unremarkable.
IMPRESSION: Mild pulmonary vascular prominence without overt pulmonary edema. No
acute appearing airspace opacity. No pleural effusion.

## 2020-05-30 MED ORDER — HYDROMORPHONE HCL 1 MG/ML IJ SOLN
1.0000 mg | Freq: Once | INTRAMUSCULAR | Status: AC
  Administered 2020-05-30: 1 mg via INTRAVENOUS
  Filled 2020-05-30: qty 1

## 2020-05-30 MED ORDER — ACETAMINOPHEN 650 MG RE SUPP: 650.0000 mg | Freq: Four times a day (QID) | RECTAL | Status: DC | PRN

## 2020-05-30 MED ORDER — POTASSIUM CHLORIDE CRYS ER 20 MEQ PO TBCR
40.0000 meq | EXTENDED_RELEASE_TABLET | Freq: Once | ORAL | Status: AC
  Administered 2020-05-30: 40 meq via ORAL
  Filled 2020-05-30: qty 2

## 2020-05-30 MED ORDER — TRAMADOL HCL 50 MG PO TABS
50.0000 mg | ORAL_TABLET | Freq: Four times a day (QID) | ORAL | Status: DC | PRN
  Administered 2020-05-30 – 2020-06-02 (×8): 50 mg via ORAL
  Filled 2020-05-30 (×8): qty 1

## 2020-05-30 MED ORDER — AMLODIPINE BESYLATE 5 MG PO TABS
5.0000 mg | ORAL_TABLET | Freq: Every day | ORAL | Status: DC
  Administered 2020-05-30 – 2020-05-31 (×2): 5 mg via ORAL
  Filled 2020-05-30 (×2): qty 1

## 2020-05-30 MED ORDER — TRAMADOL HCL 50 MG PO TABS
50.0000 mg | ORAL_TABLET | Freq: Once | ORAL | Status: AC
  Administered 2020-05-30: 50 mg via ORAL
  Filled 2020-05-30: qty 1

## 2020-05-30 MED ORDER — ENOXAPARIN SODIUM 40 MG/0.4ML ~~LOC~~ SOLN
40.0000 mg | SUBCUTANEOUS | Status: DC
  Administered 2020-05-30 – 2020-06-02 (×4): 40 mg via SUBCUTANEOUS
  Filled 2020-05-30 (×4): qty 0.4

## 2020-05-30 MED ORDER — HYDROXYZINE HCL 10 MG PO TABS
10.0000 mg | ORAL_TABLET | Freq: Once | ORAL | Status: AC
  Administered 2020-05-30: 10 mg via ORAL
  Filled 2020-05-30: qty 1

## 2020-05-30 MED ORDER — ONDANSETRON HCL 4 MG/2ML IJ SOLN
4.0000 mg | Freq: Four times a day (QID) | INTRAMUSCULAR | Status: DC | PRN
  Administered 2020-05-31: 4 mg via INTRAVENOUS
  Filled 2020-05-30: qty 2

## 2020-05-30 MED ORDER — LABETALOL HCL 5 MG/ML IV SOLN: 20.0000 mg | INTRAVENOUS | Status: DC | PRN

## 2020-05-30 MED ORDER — ONDANSETRON HCL 4 MG/2ML IJ SOLN
4.0000 mg | Freq: Once | INTRAMUSCULAR | Status: AC
  Administered 2020-05-30: 4 mg via INTRAVENOUS
  Filled 2020-05-30: qty 2

## 2020-05-30 MED ORDER — CARVEDILOL 3.125 MG PO TABS
6.2500 mg | ORAL_TABLET | Freq: Two times a day (BID) | ORAL | Status: DC
  Administered 2020-05-30 – 2020-06-01 (×4): 6.25 mg via ORAL
  Filled 2020-05-30: qty 1
  Filled 2020-05-30 (×3): qty 2

## 2020-05-30 MED ORDER — LABETALOL HCL 5 MG/ML IV SOLN
40.0000 mg | Freq: Once | INTRAVENOUS | Status: AC
  Administered 2020-05-30: 40 mg via INTRAVENOUS
  Filled 2020-05-30: qty 8

## 2020-05-30 MED ORDER — LABETALOL HCL 5 MG/ML IV SOLN: 10.0000 mg | INTRAVENOUS | Status: DC | PRN

## 2020-05-30 MED ORDER — ONDANSETRON HCL 4 MG PO TABS: 4.0000 mg | ORAL_TABLET | Freq: Four times a day (QID) | ORAL | Status: DC | PRN

## 2020-05-30 MED ORDER — LISINOPRIL 10 MG PO TABS
10.0000 mg | ORAL_TABLET | Freq: Every day | ORAL | Status: DC
  Administered 2020-05-30: 10 mg via ORAL
  Filled 2020-05-30: qty 1

## 2020-05-30 MED ORDER — IOHEXOL 350 MG/ML SOLN
75.0000 mL | Freq: Once | INTRAVENOUS | Status: AC | PRN
  Administered 2020-05-30: 75 mL via INTRAVENOUS

## 2020-05-30 MED ORDER — LABETALOL HCL 5 MG/ML IV SOLN
20.0000 mg | Freq: Once | INTRAVENOUS | Status: AC
  Administered 2020-05-30: 20 mg via INTRAVENOUS
  Filled 2020-05-30: qty 4

## 2020-05-30 MED ORDER — POLYETHYLENE GLYCOL 3350 17 G PO PACK: 17.0000 g | PACK | Freq: Every day | ORAL | Status: DC | PRN

## 2020-05-30 MED ORDER — ACETAMINOPHEN 325 MG PO TABS
650.0000 mg | ORAL_TABLET | Freq: Four times a day (QID) | ORAL | Status: DC | PRN
  Administered 2020-05-31 – 2020-06-01 (×2): 325 mg via ORAL
  Filled 2020-05-30 (×2): qty 2

## 2020-05-30 NOTE — ED Notes (Signed)
Pt sat up in bed to remove shirt, O2 sat dropped to 88% on 2L [place]; increased to 3L [place], O2 sat increased to 96%

## 2020-05-30 NOTE — H&P (Signed)
History and Physical    DESMEN SCHOFFSTALL UVO:[id number] DOB: Nov 16, 1980 DOA: 05/30/2020  PCP: [name], MD   Patient coming from: Home  I have personally briefly reviewed patient's old medical records in [facility]  Chief Complaint: Difficulty breathing, headaches, fatigue  HPI: IZIK BINGMAN is a 39 y.o. male with history of hypertension, presented to the ED with complaints of headache, difficulty breathing, fatigue. Patient reports headache started 2 to 3 days ago, mostly involving the sides and front of his head.  Reports difficulty breathing has been ongoing since April when he got his second Covid vaccine, has been gradual and progressed, he came to the ED now because he could not take it anymore.  Breathing is worse with activity, and when he bends over, and occasionally when he is lying down flat.  He is most comfortable sitting up.  Reports swelling of his ankles about a week ago which has resolved, no swelling now.  Occasional abdominal bloating, mild weight gain but nothing significant.  He denies chest pain.  No cough.  No fevers or chills.  Remote history of smoking cigarettes, very occasional/social alcohol intake. Reports heart disease in grandparents, no heart disease in parents, tells me he has no siblings.  Reports his father is currently requiring oxygen.  He denies any known history of multiple sclerosis or neurologic disorders in his family. Patient used to be on lisinopril, but he lost history of and so stopped the medication.  ED Course: Blood pressure 222/142, temperature 98.6, heart rate 80s to low 100s.  O2 sats dropped to 88 percent, placed on 3 L O2.  Temperature 98.6.  BNP 265.  Potassium 3.1.  Covid test negative.  EKG showing inferior lateral T wave inversions.   Portable chest x-ray showing pulmonary vascular prominence. 40 mg IV labetalol given.  Hospitalist to admit for acute respiratory failure, elevated blood pressure.  Review of Systems: As per HPI  all other systems reviewed and negative.   does not have a smoking history on file. He has never used smokeless tobacco. He reports current alcohol use. He reports that he does not use drugs.  Allergies  Allergen Reactions  . Cephalexin   . Penicillins   . Sulfonamide Derivatives    Family history of heart disease and hypertension in grandparents.  Prior to Admission medications   Not on File    Physical Exam: Vitals:   05/30/20 1410 05/30/20 1415 05/30/20 1442 05/30/20 1500  BP: (!) 168/121 (!) 168/112 (!) 147/126 (!) 175/125  Pulse: 88 88 87 86  Resp:   18 18  Temp:      TempSrc:      SpO2: 95% 94% 97% 92%  Weight:      Height:        Constitutional: NAD, calm, comfortable Vitals:   05/30/20 1410 05/30/20 1415 05/30/20 1442 05/30/20 1500  BP: (!) 168/121 (!) 168/112 (!) 147/126 (!) 175/125  Pulse: 88 88 87 86  Resp:   18 18  Temp:      TempSrc:      SpO2: 95% 94% 97% 92%  Weight:      Height:       Eyes: PERRL, lids and conjunctivae normal ENMT: Mucous membranes are moist.   Neck: normal, supple, no masses, no thyromegaly Respiratory: O2 sats down to 88% at the time of my evaluation on room air, clear to auscultation bilaterally, no wheezing, no crackles. Normal respiratory effort. No accessory muscle use.  Cardiovascular: Regular  rate and rhythm, no murmurs / rubs / gallops. No extremity edema. 2+ pedal pulses.  Abdomen: no tenderness, no masses palpated. No hepatosplenomegaly. Bowel sounds positive.  Musculoskeletal: no clubbing / cyanosis. No joint deformity upper and lower extremities. Good ROM, no contractures. Normal muscle tone.  Skin: no rashes, lesions, ulcers. No induration Neurologic: No apparent cranial normality, moving all extremities spontaneously. Psychiatric: Normal judgment and insight. Alert and oriented x 3. Normal mood.   Labs on Admission: I have personally reviewed following labs and imaging studies  CBC: Recent Labs  Lab 05/30/20 1224   WBC 7.0  NEUTROABS 5.4  HGB 14.2  HCT 42.4  MCV 92.0  PLT 171   Basic Metabolic Panel: Recent Labs  Lab 05/30/20 1224  NA 136  K 3.1*  CL 98  CO2 25  GLUCOSE 126*  BUN 12  CREATININE 1.23  CALCIUM 8.7*   Liver Function Tests: Recent Labs  Lab 05/30/20 1224  AST 24  ALT 23  ALKPHOS 85  BILITOT 1.5*  PROT 8.2*  ALBUMIN 4.4    Radiological Exams on Admission: CT Head Wo Contrast  Result Date: 05/30/2020 CLINICAL DATA:  Worsening headache and fatigue, hypertension EXAM: CT HEAD WITHOUT CONTRAST TECHNIQUE: Contiguous axial images were obtained from the base of the skull through the vertex without intravenous contrast. COMPARISON:  None. FINDINGS: Brain: No evidence of acute infarction, hemorrhage, hydrocephalus, extra-axial collection or mass lesion/mass effect. Periventricular and deep white matter hypodensity (series 2, image 20). Vascular: No hyperdense vessel or unexpected calcification. Skull: Normal. Negative for fracture or focal lesion. Sinuses/Orbits: No acute finding. Other: None. IMPRESSION: 1.  No acute intracranial pathology. 2. Periventricular and deep white matter hypodensity, which may reflect hypertensive small-vessel white matter disease, but is significantly advanced for patient age. Demyelinating disorder such as multiple sclerosis is a differential consideration. Consider contrast enhanced MRI of the brain and cervical spine to further evaluate on a nonemergent basis if warranted by clinical suspicion based on signs and symptoms. Electronically Signed   By: Lauralyn Primes M.D.   On: 05/30/2020 13:08   DG Chest Port 1 View  Result Date: 05/30/2020 CLINICAL DATA:  Shortness of breath EXAM: PORTABLE CHEST 1 VIEW COMPARISON:  None. FINDINGS: The heart size and mediastinal contours are within normal limits. Mild pulmonary vascular prominence. Both lungs are clear. The visualized skeletal structures are unremarkable. IMPRESSION: Mild pulmonary vascular prominence  without overt pulmonary edema. No acute appearing airspace opacity. No pleural effusion. Electronically Signed   By: Lauralyn Primes M.D.   On: 05/30/2020 13:03    EKG: Independently reviewed.  Diffuse T wave inversions in V5 V6, 2 3 aVF.  QTc 462.  No prior EKG to compare.  Assessment/Plan Principal Problem:   Hypertensive urgency Active Problems:   Dyspnea   Acute hypoxic respiratory failure-dyspneic, O2 sats down to 88%, currently on 3 L.  Portable chest x-ray shows pulmonary vascular prominence.  BNP mildly elevated at 265.  No clinical signs of volume overload.  Covid test negative.  Patient has received both Covid vaccines doses. Obtained stat CTA chest-no PE, shows mild nonspecific mediastinal and bilateral hilar adenopathy-differentials include sarcoid and lymphoma.  Also findings suggestive of pulmonary artery hypertension.  Which is likely etiology for his dyspnea and respiratory failure. -Will consult pulmonology as suggested by radiology -Supplemental O2 -Obtain echocardiogram to evaluate pulmonary artery hypertension  Hypertensive urgency-blood pressure 222/175, previously on lisinopril. IV labetalol 20 mg and then 40 mg x 1 given in ED with improvement  in blood pressure. -Start Coreg 6.25 twice daily -Start Norvasc 5 mg, (would have preferred lisinopril or HCTZ, but just got IV contrast) -IV labetalol 20 mg PRN for systolic > 180, as he appears to be chronically hypertensive. -UDS  Headaches-in the setting of hypertensive urgency, but head CT without acute intracranial abnormality, but shows abnormalities concerning for hypertensive small vessel white matter disease advanced for age, versus demyelinating disorder such as multiple sclerosis. -Held off on MRI spine and brain, as per the CT recommendations, this is nonemergent, and clinically presentation seem related to uncontrolled hypertension -Consider phone consultation with neurology -Tramadol as needed  Abnormal EKG-  inferior lateral T wave inversions.  Likely repolarization abnormalities from hypertensive heart disease/ LV strain.  No chest pain.  Has dyspnea.  Remote history of smoking.  No known family history of premature coronary artery disease.  Appears to have had uncontrolled hypertension at least since 2019, [facility] notes has systolic in the 190s, despite lisinopril he was on. -Trend troponin -Obtain echocardiogram -May need cardiology evaluation pending clinical course -Titrate antihypertensives as tolerated  Hypokalemia -Potassium 3.1 -Check magnesium-1.7 - Replete potassium   DVT prophylaxis: Lovenox Code Status: Full code Family Communication: None at bedside. Disposition Plan: ~ 2 days, pending improvement in blood pressure, pulmonology work-up. Consults called:.  Pulmonology. Admission status: Observation, telemetry.   Onnie Boer MD [facility]  05/30/2020, 5:09 PM

## 2020-05-30 NOTE — ED Provider Notes (Signed)
[facility] EMERGENCY DEPARTMENT Provider Note   CSN: [id number] Arrival date & time: 05/30/20  1127     History Chief Complaint  Patient presents with  . Headache  . Fever    Ricardo Evans is a 39 y.o. male.  Patient complains of a headache and fatigue  The history is provided by the patient and medical records. No language interpreter was used.  Headache Pain location:  Frontal Quality:  Dull Radiates to:  Does not radiate Severity currently:  7/10 Severity at highest:  7/10 Onset quality:  Gradual Timing:  Constant Progression:  Worsening Chronicity:  New Context: not activity   Relieved by:  Nothing Worsened by:  Nothing Associated symptoms: cough and fever   Associated symptoms: no abdominal pain, no back pain, no congestion, no diarrhea, no fatigue, no seizures and no sinus pressure   Fever Associated symptoms: cough and headaches   Associated symptoms: no chest pain, no congestion, no diarrhea and no rash        History reviewed. No pertinent past medical history.  Patient Active Problem List   Diagnosis Date Noted  . Dyspnea 05/30/2020  . SPRAIN AND STRAIN OF METACARPOPHALANGEAL OF HAND 01/17/2010     No family history on file.  Social History   Tobacco Use  . Smoking status: Not on file  . Smokeless tobacco: Never Used  Substance Use Topics  . Alcohol use: Yes  . Drug use: Never    Home Medications Prior to Admission medications   Not on File    Allergies    Cephalexin, Penicillins, and Sulfonamide derivatives  Review of Systems   Review of Systems  Constitutional: Positive for fever. Negative for appetite change and fatigue.  HENT: Negative for congestion, ear discharge and sinus pressure.   Eyes: Negative for discharge.  Respiratory: Positive for cough and shortness of breath.   Cardiovascular: Negative for chest pain.  Gastrointestinal: Negative for abdominal pain and diarrhea.  Genitourinary: Negative for frequency and  hematuria.  Musculoskeletal: Negative for back pain.  Skin: Negative for rash.  Neurological: Positive for headaches. Negative for seizures.  Psychiatric/Behavioral: Negative for hallucinations.    Physical Exam Updated Vital Signs BP (!) 175/125   Pulse 86   Temp 98.6 F (37 C) (Oral)   Resp 18   Ht 6' (1.829 m)   Wt 93 kg   SpO2 92%   BMI 27.80 kg/m   Physical Exam Constitutional:      Appearance: He is well-developed.  HENT:     Head: Normocephalic.  Eyes:     General: No scleral icterus.    Conjunctiva/sclera: Conjunctivae normal.  Neck:     Thyroid: No thyromegaly.  Cardiovascular:     Rate and Rhythm: Normal rate and regular rhythm.     Heart sounds: No murmur heard.  No friction rub. No gallop.   Pulmonary:     Breath sounds: No stridor. No wheezing or rales.  Chest:     Chest wall: No tenderness.  Abdominal:     General: There is no distension.     Tenderness: There is no abdominal tenderness. There is no rebound.  Musculoskeletal:        General: Normal range of motion.     Cervical back: Neck supple.  Lymphadenopathy:     Cervical: No cervical adenopathy.  Skin:    Findings: No erythema or rash.  Neurological:     Mental Status: He is oriented to person, place, and time.  Motor: No abnormal muscle tone.     Coordination: Coordination normal.  Psychiatric:        Behavior: Behavior normal.     ED Results / Procedures / Treatments   Labs (all labs ordered are listed, but only abnormal results are displayed) Labs Reviewed  COMPREHENSIVE METABOLIC PANEL - Abnormal; Notable for the following components:      Result Value   Potassium 3.1 (*)    Glucose, Bld 126 (*)    Calcium 8.7 (*)    Total Protein 8.2 (*)    Total Bilirubin 1.5 (*)    All other components within normal limits  BRAIN NATRIURETIC PEPTIDE - Abnormal; Notable for the following components:   B Natriuretic Peptide 265.0 (*)    All other components within normal limits  SARS  CORONAVIRUS 2 BY RT PCR (HOSPITAL ORDER, PERFORMED IN [facility])  CBC WITH DIFFERENTIAL/PLATELET  MAGNESIUM  TROPONIN I (HIGH SENSITIVITY)    EKG None  Radiology CT Head Wo Contrast  Result Date: 05/30/2020 CLINICAL DATA:  Worsening headache and fatigue, hypertension EXAM: CT HEAD WITHOUT CONTRAST TECHNIQUE: Contiguous axial images were obtained from the base of the skull through the vertex without intravenous contrast. COMPARISON:  None. FINDINGS: Brain: No evidence of acute infarction, hemorrhage, hydrocephalus, extra-axial collection or mass lesion/mass effect. Periventricular and deep white matter hypodensity (series 2, image 20). Vascular: No hyperdense vessel or unexpected calcification. Skull: Normal. Negative for fracture or focal lesion. Sinuses/Orbits: No acute finding. Other: None. IMPRESSION: 1.  No acute intracranial pathology. 2. Periventricular and deep white matter hypodensity, which may reflect hypertensive small-vessel white matter disease, but is significantly advanced for patient age. Demyelinating disorder such as multiple sclerosis is a differential consideration. Consider contrast enhanced MRI of the brain and cervical spine to further evaluate on a nonemergent basis if warranted by clinical suspicion based on signs and symptoms. Electronically Signed   By: Lauralyn Primes M.D.   On: 05/30/2020 13:08   DG Chest Port 1 View  Result Date: 05/30/2020 CLINICAL DATA:  Shortness of breath EXAM: PORTABLE CHEST 1 VIEW COMPARISON:  None. FINDINGS: The heart size and mediastinal contours are within normal limits. Mild pulmonary vascular prominence. Both lungs are clear. The visualized skeletal structures are unremarkable. IMPRESSION: Mild pulmonary vascular prominence without overt pulmonary edema. No acute appearing airspace opacity. No pleural effusion. Electronically Signed   By: Lauralyn Primes M.D.   On: 05/30/2020 13:03    Procedures Procedures (including critical care  time)  Medications Ordered in ED Medications  potassium chloride SA (KLOR-CON) CR tablet 40 mEq (has no administration in time range)  HYDROmorphone (DILAUDID) injection 1 mg (1 mg Intravenous Given 05/30/20 1219)  labetalol (NORMODYNE) injection 20 mg (20 mg Intravenous Given 05/30/20 1219)  ondansetron (ZOFRAN) injection 4 mg (4 mg Intravenous Given 05/30/20 1226)  labetalol (NORMODYNE) injection 40 mg (40 mg Intravenous Given 05/30/20 1313)    ED Course  I have reviewed the triage vital signs and the nursing notes.  Pertinent labs & imaging results that were available during my care of the patient    CRITICAL CARE Performed by: Bethann Berkshire Total critical care time: 40 minutes Critical care time was exclusive of separately billable procedures and treating other patients. Critical care was necessary to treat or prevent imminent or life-threatening deterioration. Critical care was time spent personally by me on the following activities: development of treatment plan with patient and/or surrogate as well as nursing, discussions with consultants, evaluation of patient's  response to treatment, examination of patient, obtaining history from patient or surrogate, ordering and performing treatments and interventions, ordering and review of laboratory studies, ordering and review of radiographic studies, pulse oximetry and re-evaluation of patient's condition.  MDM Rules/Calculators/A&P                          Patient with hypertensive urgency.  Medicine will admit       This patient presents to the ED for concern of headache, this involves an extensive number of treatment options, and is a complaint that carries with it a high risk of complications and morbidity.  The differential diagnosis includes cerebral bleed   Lab Tests:   I Ordered, reviewed, and interpreted labs, which included CBC chemistries Which showed mild hypokalemia Medicines ordered:   I ordered medication labetalol  for blood pressure  Imaging Studies ordered:   I ordered imaging studies which included CT head and  I independently visualized and interpreted imaging which showed small vessel disease  Additional history obtained:   Additional history obtained from records  Previous records obtained and reviewed.  Consultations Obtained:   I consulted hospitalist and discussed lab and imaging findings  Reevaluation:  After the interventions stated above, I reevaluated the patient and found improved  Critical Interventions:  .   Final Clinical Impression(s) / ED Diagnoses Final diagnoses:  Hypertensive urgency    Rx / DC Orders ED Discharge Orders    None       Bethann Berkshire, MD 06/01/20 1054

## 2020-05-30 NOTE — ED Triage Notes (Signed)
Pt to er, pt states that he has a headache, sob and general fatigue, states that this started about two days ago.  States that he just has no energy and his head is pounding.

## 2020-05-30 NOTE — ED Notes (Signed)
Pt O2 sats drop to 88% intermittently; pt placed on 2L [place], O2 sat increased to 95%

## 2020-05-31 ENCOUNTER — Observation Stay (HOSPITAL_COMMUNITY): Payer: Self-pay

## 2020-05-31 DIAGNOSIS — J9601 Acute respiratory failure with hypoxia: Secondary | ICD-10-CM | POA: Diagnosis present

## 2020-05-31 DIAGNOSIS — R0600 Dyspnea, unspecified: Secondary | ICD-10-CM | POA: Diagnosis present

## 2020-05-31 DIAGNOSIS — I160 Hypertensive urgency: Principal | ICD-10-CM

## 2020-05-31 DIAGNOSIS — R590 Localized enlarged lymph nodes: Secondary | ICD-10-CM

## 2020-05-31 DIAGNOSIS — R9431 Abnormal electrocardiogram [ECG] [EKG]: Secondary | ICD-10-CM

## 2020-05-31 LAB — SEDIMENTATION RATE: Sed Rate: 27 mm/hr — ABNORMAL HIGH (ref 0–16)

## 2020-05-31 LAB — SARS CORONAVIRUS 2 BY RT PCR (HOSPITAL ORDER, PERFORMED IN ~~LOC~~ HOSPITAL LAB): SARS Coronavirus 2: NEGATIVE

## 2020-05-31 MED ORDER — ALPRAZOLAM 0.5 MG PO TABS
0.5000 mg | ORAL_TABLET | Freq: Every evening | ORAL | Status: DC | PRN
  Filled 2020-05-31: qty 1

## 2020-05-31 MED ORDER — LABETALOL HCL 5 MG/ML IV SOLN
10.0000 mg | INTRAVENOUS | Status: DC | PRN
  Administered 2020-05-31 (×2): 10 mg via INTRAVENOUS
  Filled 2020-05-31 (×3): qty 4

## 2020-05-31 MED ORDER — DM-GUAIFENESIN ER 30-600 MG PO TB12
2.0000 | ORAL_TABLET | Freq: Two times a day (BID) | ORAL | Status: DC
  Administered 2020-05-31 (×2): 2 via ORAL
  Filled 2020-05-31 (×2): qty 2

## 2020-05-31 MED ORDER — AZITHROMYCIN 250 MG PO TABS
500.0000 mg | ORAL_TABLET | Freq: Every day | ORAL | Status: AC
  Administered 2020-05-31: 500 mg via ORAL
  Filled 2020-05-31: qty 2

## 2020-05-31 MED ORDER — PANTOPRAZOLE SODIUM 40 MG PO TBEC
40.0000 mg | DELAYED_RELEASE_TABLET | Freq: Two times a day (BID) | ORAL | Status: DC
  Administered 2020-05-31 – 2020-06-02 (×5): 40 mg via ORAL
  Filled 2020-05-31 (×5): qty 1

## 2020-05-31 MED ORDER — AMLODIPINE BESYLATE 5 MG PO TABS
5.0000 mg | ORAL_TABLET | Freq: Once | ORAL | Status: AC
  Administered 2020-05-31: 5 mg via ORAL

## 2020-05-31 MED ORDER — AZITHROMYCIN 250 MG PO TABS
250.0000 mg | ORAL_TABLET | Freq: Every day | ORAL | Status: DC
  Administered 2020-06-01 – 2020-06-02 (×2): 250 mg via ORAL
  Filled 2020-05-31 (×2): qty 1

## 2020-05-31 MED ORDER — AMLODIPINE BESYLATE 5 MG PO TABS
10.0000 mg | ORAL_TABLET | Freq: Every day | ORAL | Status: DC
  Administered 2020-06-01 – 2020-06-02 (×2): 10 mg via ORAL
  Filled 2020-05-31 (×3): qty 2

## 2020-05-31 NOTE — Progress Notes (Signed)
Patient Demographics:    Ricardo Evans, is a 39 y.o. male, DOB - [birth year], WOE:[id number]  Admit date - 05/30/2020   Admitting Physician Acire Tang Denton Brick, MD  Outpatient Primary MD for the patient is Rory Percy, MD  LOS - 0   Chief Complaint  Patient presents with   Headache   Fever        Subjective:    Ricardo Evans today has no fevers, no emesis,  No chest pain,   --Girl friend at bedside, shortness of breath at rest better --- Dyspnea on exertion persist  Assessment  & Plan :    Principal Problem:   Hypertensive urgency Active Problems:   Dyspnea   Acute respiratory failure with hypoxemia (HCC)   Hilar adenopathy   Dyspnea and respiratory abnormalities  Brief Summary:- 39 year old reformed smoker with past medical history relevant for HTN admitted on 05/30/2020 with concerns about  hypertensive urgency with headache with blood pressure of 222/142  and found to have acute hypoxic respiratory failure and CTA chest findings concerning for sarcoid versus lymphoma, as well as concerns for possible pulmonary hypertension   A/p 1)Acute hypoxic respiratory failure----CTA chest without PE, 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 with bilateral hilar and mediastinal lymphadenopathy - -BNP and troponins not consistent with ACS or significant heart failure per se --pulmonology consult appreciated --- We will try to wean off oxygen -CTA chest also suggestive of possible bronchitis, -Continue bronchodilators, mucolytics and azithromycin empirically -Repeat Covid test pending, -Please note that the patient was vaccinated against COVID-19 infection back in April 2021 -ESR is 27 -ACE level pending -Pulmonologist requested QuantiFERON Gold  2)???  Pulmonary Hypertension--- echocardiogram pending  3)HTN--- admitted with hypertensive urgency with headache with blood pressure of 222/142  initially, - BP still Not at goal, increase amlodipine to 10 mg daily, continue Coreg at 625 mg twice daily, IV labetalol as needed elevated BP  Disposition/Need for in-Hospital Stay- patient unable to be discharged at this time due to hypertensive urgency and acute hypoxic respiratory failure requiring supplemental oxygen and further work-up on IV medications for BP control=----- dyspnea on exertion persist  Status is: Inpatient  Remains inpatient appropriate because:hypertensive urgency and acute hypoxic respiratory failure requiring supplemental oxygen and further work-up on IV medications for BP control dyspnea on exertion persist  Disposition: The patient is from: Home              Anticipated d/c is to: Home              Anticipated d/c date is: 1 day              Patient currently is not medically stable to d/c. Barriers: Not Clinically Stable- hypertensive urgency and acute hypoxic respiratory failure requiring supplemental oxygen and further work-up on IV medications for BP control--dyspnea on exertion persist  Code Status : Full   Family Communication:    (patient is alert, awake and coherent)   Consults  :  Pulm  DVT Prophylaxis  :  Lovenox -  SCDs  Lab Results  Component Value Date   PLT 171 05/30/2020    Inpatient Medications  Scheduled Meds:  [START ON 06/01/2020] amLODipine  10 mg Oral Daily   [START ON 06/01/2020] azithromycin  250 mg Oral Daily   carvedilol  6.25 mg Oral BID WC   dextromethorphan-guaiFENesin  2 tablet Oral BID   enoxaparin (LOVENOX) injection  40 mg Subcutaneous Q24H   pantoprazole  40 mg Oral BID AC   Continuous Infusions: PRN Meds:.acetaminophen **OR** acetaminophen, labetalol, ondansetron **OR** ondansetron (ZOFRAN) IV, polyethylene glycol, traMADol    Anti-infectives (From admission, onward)   Start     Dose/Rate Route Frequency Ordered Stop   06/01/20 1000  azithromycin (ZITHROMAX) tablet 250 mg     Discontinue    "Followed by"  Linked Group Details   250 mg Oral Daily 05/31/20 1450 06/05/20 0959   05/31/20 1500  azithromycin (ZITHROMAX) tablet 500 mg       "Followed by" Linked Group Details   500 mg Oral Daily 05/31/20 1450 05/31/20 1519        Objective:   Vitals:   05/30/20 2326 05/31/20 0449 05/31/20 0731 05/31/20 1514  BP: (!) 159/106 (!) 150/105 (!) 165/109 (!) 174/117  Pulse: 82 76 80 85  Resp: _0 Temp: 98.3 F (36.8 C) 98.2 F (36.8 C)    TempSrc:      SpO2: 97% 99% 99% 97%  Weight:      Height:        Wt Readings from Last 3 Encounters:  05/30/20 93 kg    No intake or output data in the 24 hours ending 05/31/20 1659   Physical Exam  Gen:- Awake Alert, no conversational dyspnea HEENT:- [place].AT, No sclera icterus Nose- [place] 2L/min Neck-Supple Neck,No JVD,.  Lungs-diminished breath sounds, no significant wheezing at this time  CV- S1, S2 normal, regular , dyspnea on exertion persist Abd-  +ve B.Sounds, Abd Soft, No tenderness,    Extremity/Skin:- No  edema, pedal pulses present Psych-affect is appropriate, oriented x3 Neuro-no new focal deficits, no tremors   Data Review:   Micro Results Recent Results (from the past 240 hour(s))  SARS Coronavirus 2 by RT PCR (hospital order, performed in [facility] hospital lab) Nasopharyngeal Nasopharyngeal Swab     Status: None   Collection Time: 05/30/20 12:16 PM   Specimen: Nasopharyngeal Swab  Result Value Ref Range Status   SARS Coronavirus 2 NEGATIVE NEGATIVE Final    Comment: (NOTE) SARS-CoV-2 target nucleic acids are NOT DETECTED.  The SARS-CoV-2 RNA is generally detectable in upper and lower respiratory specimens during the acute phase of infection. The lowest concentration of SARS-CoV-2 viral copies this assay can detect is 250 copies / mL. A negative result does not preclude SARS-CoV-2 infection and should not be used as the sole basis for treatment or other patient management decisions.  A negative result may occur  with improper specimen collection / handling, submission of specimen other than nasopharyngeal swab, presence of viral mutation(s) within the areas targeted by this assay, and inadequate number of viral copies (<250 copies / mL). A negative result must be combined with clinical observations, patient history, and epidemiological information.  Fact Sheet for Patients:   StrictlyIdeas.no  Fact Sheet for Healthcare Providers: BankingDealers.co.za  This test is not yet approved or  cleared by the [place] FDA and has been authorized for detection and/or diagnosis of SARS-CoV-2 by FDA under an Emergency Use Authorization (EUA).  This EUA will remain in effect (meaning this test can be used) for the duration of the COVID-19 declaration under Section 564(b)(1) of the Act, 21 U.S.C. section 360bbb-3(b)(1), unless the authorization is terminated or revoked sooner.  Performed at  [address] [address] [place], [place] [address]     Radiology Reports CT Head Wo Contrast  Result Date: 05/30/2020 CLINICAL DATA:  Worsening headache and fatigue, hypertension EXAM: CT HEAD WITHOUT CONTRAST TECHNIQUE: Contiguous axial images were obtained from the base of the skull through the vertex without intravenous contrast. COMPARISON:  None. FINDINGS: Brain: No evidence of acute infarction, hemorrhage, hydrocephalus, extra-axial collection or mass lesion/mass effect. Periventricular and deep white matter hypodensity (series 2, image 20). Vascular: No hyperdense vessel or unexpected calcification. Skull: Normal. Negative for fracture or focal lesion. Sinuses/Orbits: No acute finding. Other: None. IMPRESSION: 1.  No acute intracranial pathology. 2. Periventricular and deep white matter hypodensity, which may reflect hypertensive small-vessel white matter disease, but is significantly advanced for patient age. Demyelinating disorder such as multiple sclerosis is a  differential consideration. Consider contrast enhanced MRI of the brain and cervical spine to further evaluate on a nonemergent basis if warranted by clinical suspicion based on signs and symptoms. Electronically Signed   By: Eddie Candle M.D.   On: 05/30/2020 13:08   CT ANGIO CHEST PE W OR WO CONTRAST  Result Date: 05/30/2020 CLINICAL DATA:  Dyspnea, worsened with exertion.  Hypoxia. EXAM: CT ANGIOGRAPHY CHEST WITH CONTRAST TECHNIQUE: Multidetector CT imaging of the chest was performed using the standard protocol during bolus administration of intravenous contrast. Multiplanar CT image reconstructions and MIPs were obtained to evaluate the vascular anatomy. CONTRAST:  50m OMNIPAQUE IOHEXOL 350 MG/ML SOLN COMPARISON:  Chest radiograph from earlier today. FINDINGS: Cardiovascular: The study is high quality for the evaluation of pulmonary embolism. There are no filling defects in the central, lobar, segmental or subsegmental pulmonary artery branches to suggest acute pulmonary embolism. Minimally atherosclerotic thoracic aorta with ectatic 4.2 cm ascending thoracic aorta. Dilated main pulmonary artery (3.6 cm diameter). Top-normal heart size. No significant pericardial fluid/thickening. Mediastinum/Nodes: No discrete thyroid nodules. Unremarkable esophagus. No axillary adenopathy. Multiple mildly enlarged paratracheal, left prevascular, subcarinal and bilateral hilar noncalcified lymph nodes. Representative 1.4 cm right paratracheal node (series 4/image 29). Representative 1.6 cm subcarinal node (series 4/image 44). Representative 1.4 cm right hilar node (series 4/image 41). Lungs/Pleura: No pneumothorax. No pleural effusion. No acute consolidative airspace disease, lung masses or significant pulmonary nodules. Diffuse bronchial wall thickening. Mild mosaic attenuation throughout both lungs. Upper abdomen: No acute abnormality. Musculoskeletal:  No aggressive appearing focal osseous lesions. Review of the MIP  images confirms the above findings. IMPRESSION: 1. No pulmonary embolism. 2. Mild mediastinal and bilateral hilar lymphadenopathy, nonspecific. Differential include sarcoidosis or lymphoma. Management options include chest CT with IV contrast surveillance in 3 months tissue sampling or PET-CT, as clinically warranted. Suggest pulmonology consultation. 3. Dilated main pulmonary artery, suggesting pulmonary arterial hypertension. 4. Diffuse bronchial wall thickening, nonspecific, as can be seen with bronchitis or reactive airways disease. 5. Mild mosaic attenuation throughout both lungs, nonspecific, differential includes mosaic perfusion from pulmonary vascular disease versus air trapping from small airways disease. 6. 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 Recommend annual imaging followup by CTA or MRA. This recommendation follows 2010 ACCF/AHA/AATS/ACR/ASA/SCA/SCAI/SIR/STS/SVM Guidelines for the Diagnosis and Management of Patients with Thoracic Aortic Disease. Circulation. 2010; 121:: Z169-C789 Aortic aneurysm NOS (ICD10-I71.9). 7. Aortic Atherosclerosis (ICD10-I70.0). Electronically Signed   By: JIlona SorrelM.D.   On: 05/30/2020 15:57   DG Chest Port 1 View  Result Date: 05/30/2020 CLINICAL DATA:  Shortness of breath EXAM: PORTABLE CHEST 1 VIEW COMPARISON:  None. FINDINGS: The heart size and mediastinal contours are within normal limits. Mild  pulmonary vascular prominence. Both lungs are clear. The visualized skeletal structures are unremarkable. IMPRESSION: Mild pulmonary vascular prominence without overt pulmonary edema. No acute appearing airspace opacity. No pleural effusion. Electronically Signed   By: Eddie Candle M.D.   On: 05/30/2020 13:03     CBC Recent Labs  Lab 05/30/20 1224  WBC 7.0  HGB 14.2  HCT 42.4  PLT 171  MCV 92.0  MCH 30.8  MCHC 33.5  RDW 13.7  LYMPHSABS 0.9  MONOABS 0.6  EOSABS 0.0  BASOSABS 0.0    Chemistries  Recent Labs  Lab 05/30/20 1224 05/30/20 1547  NA  136  --   K 3.1*  --   CL 98  --   CO2 25  --   GLUCOSE 126*  --   BUN 12  --   CREATININE 1.23  --   CALCIUM 8.7*  --   MG  --  1.7  AST 24  --   ALT 23  --   ALKPHOS 85  --   BILITOT 1.5*  --    ------------------------------------------------------------------------------------------------------------------ No results for input(s): CHOL, HDL, LDLCALC, TRIG, CHOLHDL, LDLDIRECT in the last 72 hours.  No results found for: HGBA1C ------------------------------------------------------------------------------------------------------------------ No results for input(s): TSH, T4TOTAL, T3FREE, THYROIDAB in the last 72 hours.  Invalid input(s): FREET3 ------------------------------------------------------------------------------------------------------------------ No results for input(s): VITAMINB12, FOLATE, FERRITIN, TIBC, IRON, RETICCTPCT in the last 72 hours.  Coagulation profile No results for input(s): INR, PROTIME in the last 168 hours.  No results for input(s): DDIMER in the last 72 hours.  Cardiac Enzymes No results for input(s): CKMB, TROPONINI, MYOGLOBIN in the last 168 hours.  Invalid input(s): CK ------------------------------------------------------------------------------------------------------------------    Component Value Date/Time   BNP 265.0 (H) 05/30/2020 1224     Roxan Hockey M.D on 05/31/2020 at 4:59 PM  Go to [email or web address] - for contact info  [facility] - Office  [phone]

## 2020-05-31 NOTE — Progress Notes (Signed)
  Echocardiogram 2D Echocardiogram has been performed.  Ricardo Evans 05/31/2020, 1:35 PM

## 2020-05-31 NOTE — TOC Initial Note (Signed)
Transition of Care [facility]) - Initial/Assessment Note   Patient Details  Name: FARON TUDISCO MRN: [id number] Date of Birth: 07/01/1981  [facility]) CM/SW Contact:    Ewing Schlein, LCSW Phone Number: 05/31/2020, 2:50 PM  Clinical Narrative: Patient is a 39 year old male who is under observation for hypertensive emergency. Per chart review, patient is currently uninsured. CSW spoke with patient regarding referral to financial counselor to determine if patient is eligible for Medicaid or another financial assistance program. Patient agreeable to referral. CSW made referral to financial counselor, Jerene Dilling. TOC to follow.  Expected Discharge Plan: Home/Self Care Barriers to Discharge: Continued Medical Work up, Inadequate or no insurance  Expected Discharge Plan and Services Expected Discharge Plan: Home/Self Care In-house Referral: Clinical Social Work Discharge Planning Services: Other - See comment (Referral to financial counselor) Post Acute Care Choice: NA Living arrangements for the past 2 months: Apartment             DME Arranged: N/A DME Agency: NA HH Arranged: NA HH Agency: NA  Prior Living Arrangements/Services Living arrangements for the past 2 months: Apartment Lives with:: Self Patient language and need for interpreter reviewed:: Yes Do you feel safe going back to the place where you live?: Yes      Need for Family Participation in Patient Care: No (Comment) Care giver support system in place?: Yes (comment) (Father) Criminal Activity/Legal Involvement Pertinent to Current Situation/Hospitalization: No - Comment as needed  Activities of Daily Living Home Assistive Devices/Equipment: None ADL Screening (condition at time of admission) Patient's cognitive ability adequate to safely complete daily activities?: Yes Is the patient deaf or have difficulty hearing?: No Does the patient have difficulty seeing, even when wearing glasses/contacts?: No Does the patient  have difficulty concentrating, remembering, or making decisions?: No Patient able to express need for assistance with ADLs?: Yes Does the patient have difficulty dressing or bathing?: No Independently performs ADLs?: Yes (appropriate for developmental age) Does the patient have difficulty walking or climbing stairs?: No Weakness of Legs: None Weakness of Arms/Hands: None  Permission Sought/Granted Permission sought to share information with : Other (comment) Research scientist (physical sciences); [facility]) Permission granted to share information with : Yes, Verbal Permission Granted Permission granted to share info w AGENCY: Artist; [facility]  Emotional Assessment Appearance:: Well-Groomed Attitude/Demeanor/Rapport: Engaged Affect (typically observed): Accepting Orientation: : Oriented to Self, Oriented to Place, Oriented to  Time, Oriented to Situation Alcohol / Substance Use: Not Applicable Psych Involvement: No (comment)  Admission diagnosis:  Dyspnea [R06.00] Hypertensive urgency [I16.0] Patient Active Problem List   Diagnosis Date Noted  . Acute respiratory failure with hypoxemia (HCC) 05/31/2020  . Hilar adenopathy 05/31/2020  . Dyspnea 05/30/2020  . Hypertensive urgency 05/30/2020  . SPRAIN AND STRAIN OF METACARPOPHALANGEAL OF HAND 01/17/2010   PCP:  [name], MD Pharmacy:   [facility] [address], [place] - [address] [place] [address] Phone: [phone] Fax: [phone]  Readmission Risk Interventions No flowsheet data found.

## 2020-05-31 NOTE — Consult Note (Addendum)
NAME:  Ricardo Evans, MRN:  [id number], DOB:  [birth year], LOS: 0 ADMISSION DATE:  05/30/2020, CONSULTATION DATE:  05/31/20  REFERRING MD:  Lucienne Minks, CHIEF COMPLAINT:  Sob/ cough   Brief History   32 yowm remote smoker very physically active at [place] with h/o hbp not treated and doe/fatigue/ orthopnea  x April 2021 gradual   Onset daily symptoms  then acutely ill 8/5 with cough/ha/ loose stools came to ER concerned he had covid but PCR neg and w/u suggestive of sarcoid vs lymphoma on CT so PCCM service asked to see pm 8/9   History of present illness   39 y.o. male with history of hypertension, presented to the ED with complaints of headache, difficulty breathing, fatigue. Patient reports headache started  3 days pta , mostly involving the sides and front of his head.  Reports difficulty breathing has been ongoing since April 2021 when he got his second Covid vaccine, has been gradual and progressed, he came to the ED now because he could not take it anymore.  Breathing is worse with activity, and when he bends over, and occasionally when he is lying down flat.  He is most comfortable sitting up.  Reports swelling of his ankles about a week PTA which has resolved, no swelling now.  Occasional abdominal bloating, mild weight gain but nothing significant.  He denies chest pain.  No cough.  No fevers or chills.  Remote history of smoking cigarettes, very occasional/social alcohol intake. Reports heart disease in grandparents, no heart disease in parents, tells me he has no siblings.  Reports his father is currently requiring oxygen.  He denies any known history of multiple sclerosis or neurologic disorders in his family. Patient used to be on lisinopril, but he lost history of and so stopped the medication.  ED Course: Blood pressure 222/142, temperature 98.6, heart rate 80s to low 100s.  O2 sats dropped to 88 percent, placed on 3 L O2.  Temperature 98.6.  BNP 265.  Potassium 3.1.  Covid test negative.   EKG showing inferior lateral T wave inversions.   Portable chest x-ray showing pulmonary vascular prominence. 40 mg IV labetalol given.  Hospitalist to admit for acute respiratory failure, elevated blood pressure.   Past Medical History  HBP  Significant Hospital Events     Consults:  PCCM 8/9  Procedures:    Significant Diagnostic Tests:  2d Echo 8/9   Micro Data:  COVID 19  PCR   8/8  Neg   Antimicrobials:  none   Scheduled Meds: . amLODipine  5 mg Oral Daily  . carvedilol  6.25 mg Oral BID WC  . enoxaparin (LOVENOX) injection  40 mg Subcutaneous Q24H   Continuous Infusions: PRN Meds:.acetaminophen **OR** acetaminophen, labetalol, ondansetron **OR** ondansetron (ZOFRAN) IV, polyethylene glycol, traMADol  Interim history/subjective:  "no better since admit"   Objective   Blood pressure (!) 165/109, pulse 80, temperature 98.2 F (36.8 C), resp. rate 18, height 6' (1.829 m), weight 93 kg, SpO2 99 %.       No intake or output data in the 24 hours ending 05/31/20 1431 Filed Weights   05/30/20 1149  Weight: 93 kg    Examination: General:  Tmax 98.3 does not appear toxic or acutely ill on 2lpm NP with sats 99%  HENT: neck supple, no nodes  Lungs: clear to A and P  Cardiovascular: RRR no S3 Abdomen: soft/ benign  Extremities: warm s  C C or E  Neuro: alert/ approp  no def     I personally reviewed images and agree with radiology impression as follows:   Chest CT  05/30/20  1.  No pulmonary embolism. 2. Mild mediastinal and bilateral hilar lymphadenopathy, nonspecific. Differential include sarcoidosis or lymphoma. Management options include chest CT with IV contrast surveillance in 3 months tissue sampling or PET-CT, as clinically warranted. Suggest pulmonology consultation. 3. Dilated main pulmonary artery, suggesting pulmonary arterial hypertension. 4. Diffuse bronchial wall thickening, nonspecific, as can be seen with bronchitis or reactive airways  disease. 5. Mild mosaic attenuation throughout both lungs, nonspecific, differential includes mosaic perfusion from pulmonary vascular disease versus air trapping from small airways disease. 6. 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 Recommend annual imaging followup by CTA or MRA  Sinus CT 05/30/20 Nl sinus scan on head CT  cuts   Resolved Hospital Problem list      Assessment & Plan:    1)  Acute URI pattern cough x8/5/21  with persistent discolored sputum rx zpak/ mucinex dm  appropriate  Plus empirical gerd rx acutely since violent coughing may cause gerd which perpetuates the cough cycle  2) Probable component hypertensive chf may be the cause of pnd/ chronic doe and fatigue - echo pending / bp still too high > rx per [facility]   3) Non-specific mediastinal/hilar adenopathy on CT chest, none > 2 cm and not likely to contribute to present symptoms but do agree sarcoid and lymphoma in ddx   4) Dilated PA likely due to diastolic dysfunction  in absence of significant lung findings   5) Hypoxemic resp failure ? Etiology  Recheck covid/ ESR/ ACE level and get bp down to lower LVEDP as edema may be contributing.     Labs   CBC: Recent Labs  Lab 05/30/20 1224  WBC 7.0  NEUTROABS 5.4  HGB 14.2  HCT 42.4  MCV 92.0  PLT 171  EOS                   0   Basic Metabolic Panel: Recent Labs  Lab 05/30/20 1224 05/30/20 1547  NA 136  --   K 3.1*  --   CL 98  --   CO2 25  --   GLUCOSE 126*  --   BUN 12  --   CREATININE 1.23  --   CALCIUM 8.7*  --   MG  --  1.7   GFR: Estimated Creatinine Clearance: 88.5 mL/min (by C-G formula based on SCr of 1.23 mg/dL). Recent Labs  Lab 05/30/20 1224  WBC 7.0    Liver Function Tests: Recent Labs  Lab 05/30/20 1224  AST 24  ALT 23  ALKPHOS 85  BILITOT 1.5*  PROT 8.2*  ALBUMIN 4.4   No results for input(s): LIPASE, AMYLASE in the last 168 hours. No results for input(s): AMMONIA in the last 168 hours.  ABG No results found  for: PHART, PCO2ART, PO2ART, HCO3, TCO2, ACIDBASEDEF, O2SAT   Coagulation Profile: No results for input(s): INR, PROTIME in the last 168 hours.  Cardiac Enzymes: No results for input(s): CKTOTAL, CKMB, CKMBINDEX, TROPONINI in the last 168 hours.  HbA1C: No results found for: HGBA1C  CBG: No results for input(s): GLUCAP in the last 168 hours.    Past Medical History  He,  has no past medical history on file.   Surgical History   History reviewed. No pertinent surgical history.   Social History   reports that he has never smoked. He has never used smokeless  tobacco. He reports current alcohol use. He reports that he does not use drugs.   Family History   His family history is not on file.   Allergies Allergies  Allergen Reactions  . Cephalexin   . Penicillins   . Sulfonamide Derivatives      Home Medications  Prior to Admission medications   Not on File      Christinia Gully, MD Pulmonary and [place] [phone]   After 7:00 pm call Elink  [phone]

## 2020-06-01 ENCOUNTER — Inpatient Hospital Stay (HOSPITAL_COMMUNITY): Payer: Self-pay

## 2020-06-01 DIAGNOSIS — J9601 Acute respiratory failure with hypoxia: Secondary | ICD-10-CM

## 2020-06-01 LAB — ECHOCARDIOGRAM LIMITED
Calc EF: 46.3 %
Height: 72 in
S' Lateral: 3.85 cm
Single Plane A2C EF: 49.4 %
Single Plane A4C EF: 40.4 %
Weight: 3280 oz

## 2020-06-01 LAB — ECHOCARDIOGRAM COMPLETE
Area-P 1/2: 3.26 cm2
Height: 72 in
S' Lateral: 3.41 cm
Weight: 3280 oz

## 2020-06-01 LAB — ANGIOTENSIN CONVERTING ENZYME: Angiotensin-Converting Enzyme: 18 U/L (ref 14–82)

## 2020-06-01 MED ORDER — LABETALOL HCL 5 MG/ML IV SOLN
10.0000 mg | INTRAVENOUS | Status: DC | PRN
  Administered 2020-06-01 (×2): 10 mg via INTRAVENOUS
  Filled 2020-06-01 (×2): qty 4

## 2020-06-01 MED ORDER — HYDRALAZINE HCL 25 MG PO TABS
25.0000 mg | ORAL_TABLET | Freq: Three times a day (TID) | ORAL | Status: DC
  Administered 2020-06-01 – 2020-06-02 (×5): 25 mg via ORAL
  Filled 2020-06-01 (×5): qty 1

## 2020-06-01 MED ORDER — CARVEDILOL 12.5 MG PO TABS
12.5000 mg | ORAL_TABLET | Freq: Two times a day (BID) | ORAL | Status: DC
  Administered 2020-06-01 – 2020-06-02 (×3): 12.5 mg via ORAL
  Filled 2020-06-01 (×3): qty 1

## 2020-06-01 MED ORDER — CLONIDINE HCL 0.1 MG/24HR TD PTWK
0.1000 mg | MEDICATED_PATCH | TRANSDERMAL | Status: DC
  Administered 2020-06-01: 0.1 mg via TRANSDERMAL
  Filled 2020-06-01: qty 1

## 2020-06-01 MED ORDER — HYDROXYZINE HCL 25 MG PO TABS
50.0000 mg | ORAL_TABLET | Freq: Every day | ORAL | Status: DC
  Administered 2020-06-01: 50 mg via ORAL
  Filled 2020-06-01: qty 2

## 2020-06-01 MED ORDER — GUAIFENESIN ER 600 MG PO TB12
1200.0000 mg | ORAL_TABLET | Freq: Two times a day (BID) | ORAL | Status: DC
  Administered 2020-06-01 – 2020-06-02 (×3): 1200 mg via ORAL
  Filled 2020-06-01 (×3): qty 2

## 2020-06-01 MED ORDER — ISOSORBIDE MONONITRATE ER 60 MG PO TB24
30.0000 mg | ORAL_TABLET | Freq: Every day | ORAL | Status: DC
  Administered 2020-06-01 – 2020-06-02 (×2): 30 mg via ORAL
  Filled 2020-06-01 (×2): qty 1

## 2020-06-01 MED ORDER — LABETALOL HCL 5 MG/ML IV SOLN
10.0000 mg | Freq: Once | INTRAVENOUS | Status: AC
  Administered 2020-06-01: 10 mg via INTRAVENOUS

## 2020-06-01 MED ORDER — HYDRALAZINE HCL 25 MG PO TABS
50.0000 mg | ORAL_TABLET | Freq: Once | ORAL | Status: AC
  Administered 2020-06-01: 50 mg via ORAL
  Filled 2020-06-01: qty 2

## 2020-06-01 MED ORDER — PERFLUTREN LIPID MICROSPHERE
1.0000 mL | INTRAVENOUS | Status: AC | PRN
  Administered 2020-06-01: 2 mL via INTRAVENOUS
  Filled 2020-06-01: qty 10

## 2020-06-01 MED ORDER — LOSARTAN POTASSIUM 50 MG PO TABS
25.0000 mg | ORAL_TABLET | Freq: Two times a day (BID) | ORAL | Status: DC
  Administered 2020-06-01 – 2020-06-02 (×3): 25 mg via ORAL
  Filled 2020-06-01 (×4): qty 1

## 2020-06-01 NOTE — Progress Notes (Signed)
*  PRELIMINARY RESULTS* Echocardiogram 2D Echocardiogram has been performed.  Stacey Drain 06/01/2020, 12:12 PM

## 2020-06-01 NOTE — Progress Notes (Signed)
[facility] night shift.  The staff reports that the patient's BP 186/132 mmHg.  He was given labetalol 10 mg IVP at 2247  yesterday evening and another 10 mg 0058.  He also received 5 mg of amlodipine at 1726 during day shift. I will increase the frequency of the labetalol 10 mg IVP to every 2 hours and will give him a one-time dose of hydralazine 50 mg p.o. now.  I have asked the staff to give him another dose of labetalol 10 mg IVP now.  He is scheduled to receive amlodipine 10 mg and carvedilol 6.25 mg p.o. later this morning.  Sanda Klein, MD

## 2020-06-01 NOTE — Progress Notes (Signed)
MD notified of current BP 176/114 and Normodyne given at 2247.

## 2020-06-01 NOTE — Progress Notes (Signed)
MD notified of current BP 154/121. Normodyne given at [phone]

## 2020-06-01 NOTE — Progress Notes (Signed)
MD notified of current BP 186/132. MD aware of multiple doses of normodyne given during the night

## 2020-06-01 NOTE — Progress Notes (Signed)
NAME:  Ricardo Evans, MRN:  [id number], DOB:  [birth year], LOS: 1 ADMISSION DATE:  05/30/2020, CONSULTATION DATE:  05/31/20  REFERRING MD:  Lucienne Minks, CHIEF COMPLAINT:  Sob/ cough   Brief History   67 yowm remote smoker very physically active at [employer] with h/o hbp not treated and doe/fatigue/ orthopnea  x April 2021 gradual   Onset daily symptoms  then acutely ill 8/5 with cough/ha/ loose stools came to ER concerned he had covid but PCR neg and w/u suggestive of sarcoid vs lymphoma on CT so PCCM service asked to see pm 8/9   History of present illness   40 y.o. male with history of hypertension, presented to the ED with complaints of headache, difficulty breathing, fatigue. Patient reports headache started  3 days pta , mostly involving the sides and front of his head.  Reports difficulty breathing has been ongoing since April 2021 when he got his second Covid vaccine, has been gradual and progressed, he came to the ED now because he could not take it anymore.  Breathing is worse with activity, and when he bends over, and occasionally when he is lying down flat.  He is most comfortable sitting up.  Reports swelling of his ankles about a week PTA which has resolved, no swelling now.  Occasional abdominal bloating, mild weight gain but nothing significant.  He denies chest pain.  No cough.  No fevers or chills.  Remote history of smoking cigarettes, very occasional/social alcohol intake. Reports heart disease in grandparents, no heart disease in parents, tells me he has no siblings.  Reports his father is currently requiring oxygen.  He denies any known history of multiple sclerosis or neurologic disorders in his family. Patient used to be on lisinopril, but he lost history of and so stopped the medication.  ED Course: Blood pressure 222/142, temperature 98.6, heart rate 80s to low 100s.  O2 sats dropped to 88 percent, placed on 3 L O2.  Temperature 98.6.  BNP 265.  Potassium 3.1.  Covid test negative.   EKG showing inferior lateral T wave inversions.   Portable chest x-ray showing pulmonary vascular prominence. 40 mg IV labetalol given.  Hospitalist to admit for acute respiratory failure, elevated blood pressure.   Past Medical History  HBP  Significant Hospital Events     Consults:  PCCM 8/9  Procedures:    Significant Diagnostic Tests:  2d Echo 8/9   LVH with mild MR ACEi  8/9  = 18 ESR   8/9  = 27    Micro Data:  COVID 19  PCR   8/8  Neg   Antimicrobials:  none   Scheduled Meds: . amLODipine  10 mg Oral Daily  . azithromycin  250 mg Oral Daily  . carvedilol  12.5 mg Oral BID WC  . cloNIDine  0.1 mg Transdermal Weekly  . enoxaparin (LOVENOX) injection  40 mg Subcutaneous Q24H  . guaiFENesin  1,200 mg Oral BID  . pantoprazole  40 mg Oral BID AC   Continuous Infusions: PRN Meds:.acetaminophen **OR** acetaminophen, ALPRAZolam, labetalol, ondansetron **OR** ondansetron (ZOFRAN) IV, polyethylene glycol, traMADol  Interim history/subjective:  Still some congestion/ rattling cough but no desats walking on RA and diastolic bp still high as 120 overnight   Objective   Blood pressure (!) 162/110, pulse 81, temperature 98.1 F (36.7 C), temperature source Oral, resp. rate 18, height 6' (1.829 m), weight 93 kg, SpO2 97 %.       No intake or output data  in the 24 hours ending 06/01/20 1308 Filed Weights   05/30/20 1149  Weight: 93 kg    Examination: General:  Tmax 98.1 still  does not appear toxic or acutely ill on RA with sats 97% at rest Pt alert, approp nad supine taking a nap on my arrival  No jvd Oropharynx clear,  mucosa nl Neck supple Lungs with a min  scattered exp > insp rhonchi bilaterally - no def insp crackles  RRR no s3 or or sign murmur Abd obese with nl  excursion  Extr warm with no edema or clubbing noted Neuro  Sensorium intact,  no apparent motor deficits         Resolved Hospital Problem list      Assessment & Plan:    1)  Acute  URI pattern cough x 05/27/20  with persistent discolored sputum rx zpak/ mucinex   appropriate   >>> Plus empirical gerd rx acutely since violent coughing may cause gerd which perpetuates the cough cycle  2) 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 Etiology        Labs   CBC: Recent Labs  Lab 05/30/20 1224  WBC 7.0  NEUTROABS 5.4  HGB 14.2  HCT 42.4  MCV 92.0  PLT 171  EOS                   0   Basic Metabolic Panel: Recent Labs  Lab 05/30/20 1224 05/30/20 1547  NA 136  --   K 3.1*  --   CL 98  --   CO2 25  --   GLUCOSE 126*  --   BUN 12  --   CREATININE 1.23  --   CALCIUM 8.7*  --   MG  --  1.7   GFR: Estimated Creatinine Clearance: 88.5 mL/min (by C-G formula based on SCr of 1.23 mg/dL). Recent Labs  Lab 05/30/20 1224  WBC 7.0    Liver Function Tests: Recent Labs  Lab 05/30/20 1224  AST 24  ALT 23  ALKPHOS 85  BILITOT 1.5*  PROT 8.2*  ALBUMIN 4.4   No results for input(s): LIPASE, AMYLASE in the last 168 hours. No results for input(s): AMMONIA in the last 168 hours.  ABG No results found for: PHART, PCO2ART, PO2ART, HCO3, TCO2, ACIDBASEDEF, O2SAT   Coagulation Profile: No results for input(s): INR, PROTIME in the last 168 hours.  Cardiac Enzymes: No results for input(s): CKTOTAL, CKMB, CKMBINDEX, TROPONINI in the last 168 hours.  HbA1C: No results found for: HGBA1C  CBG: No results for input(s):  GLUCAP in the last 168 hours.    Christinia Gully, MD [facility] [place] [phone]   After 7:00 pm call Elink  [phone]

## 2020-06-01 NOTE — Progress Notes (Addendum)
Patient Demographics:    Ricardo Evans, is a 39 y.o. male, DOB - 1981/07/26, ZGY:[id number]  Admit date - 05/30/2020   Admitting Physician Deforrest Bogle Denton Brick, MD  Outpatient Primary MD for the patient is Rory Percy, MD  LOS - 1   Chief Complaint  Patient presents with   Headache   Fever        Subjective:    Ricardo Evans today has no fevers, no emesis,  No chest pain,   -Complains of dizziness and dyspnea on exertion -Hypoxia-resolved -Blood pressure remains stubbornly elevated with systolic blood pressure over 591 and diastolic blood pressure over 130  Assessment  & Plan :    Principal Problem:   Hypertensive urgency Active Problems:   Dyspnea   Acute respiratory failure with hypoxemia (HCC)   Hilar adenopathy   Dyspnea and respiratory abnormalities  Brief Summary:- 39 year old reformed smoker with past medical history relevant for HTN admitted on 05/30/2020 with concerns about  hypertensive urgency with headache with blood pressure of 222/142  and found to have acute hypoxic respiratory failure and CTA chest findings concerning for sarcoid versus lymphoma, as well as concerns for possible pulmonary hypertension   A/p 1)Acute hypoxic respiratory failure----CTA chest without PE, 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 with bilateral hilar and mediastinal lymphadenopathy - -BNP and troponins not consistent with ACS or significant heart failure per se --pulmonology consult appreciated --- We will try to wean off oxygen -CTA chest also suggestive of possible bronchitis, -Continue bronchodilators, mucolytics and azithromycin empirically -Repeat Covid test negative -Please note that the patient was vaccinated against COVID-19 infection back in April 2021 -ESR is 27 -ACE level not elevated -Pulmonologist requested QuantiFERON Gold  2)HFrEF--- Echo with EF of 45 to 50% with global  hypokinesis and severe LVH--- 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 -Needs improved BP control as outlined in #3 below  3)HTN--- admitted with hypertensive urgency with headache with blood pressure of 222/142 initially, - BP still Not at goal,  -Increase Coreg to 12.5 mg twice daily, hydralazine/isosorbide combo, add low-dose losartan Consider stopping amlodipine to 10 mg daily,  -Clonidine patch 0.1 mg topically started  IV labetalol as needed elevated BP --Despite multiple antihypertensive agents blood pressure remains stubbornly elevated with systolic blood pressure over 638 and diastolic blood pressure over 130 -Patient with persistent dizziness and dyspnea on exertion -Discussed with cardiology team and pulmonologist patient needs additional BP control  - BP Readings from Last 3 Encounters:  06/01/20 (!) 183/134   Disposition/Need for in-Hospital Stay- patient unable to be discharged at this time due to hypertensive urgency and acute hypoxic respiratory failure requiring supplemental oxygen and further work-up on IV medications for BP control=----- dyspnea on exertion persist  Status is: Inpatient  Remains inpatient appropriate because:hypertensive urgency and acute hypoxic respiratory failure requiring supplemental oxygen and further work-up on IV medications for BP control dyspnea on exertion persist  Disposition: The patient is from: Home              Anticipated d/c is to: Home              Anticipated d/c date is: 1 day              Patient currently is not medically  stable to d/c. Barriers: Not Clinically Stable- hypertensive urgency and acute hypoxic respiratory failure requiring supplemental oxygen and further work-up on IV medications for BP control--dyspnea on exertion persist on dizziness and stable elevated BP persists  Code Status : Full   Family Communication:    (patient is alert, awake and coherent)   Consults  :  Pulm  DVT Prophylaxis  :  Lovenox -   SCDs  Lab Results  Component Value Date   PLT 171 05/30/2020   Inpatient Medications  Scheduled Meds:  amLODipine  10 mg Oral Daily   azithromycin  250 mg Oral Daily   carvedilol  12.5 mg Oral BID WC   cloNIDine  0.1 mg Transdermal Weekly   enoxaparin (LOVENOX) injection  40 mg Subcutaneous Q24H   guaiFENesin  1,200 mg Oral BID   hydrALAZINE  25 mg Oral TID   isosorbide mononitrate  30 mg Oral Daily   losartan  25 mg Oral BID   pantoprazole  40 mg Oral BID AC   Continuous Infusions: PRN Meds:.acetaminophen **OR** acetaminophen, ALPRAZolam, labetalol, ondansetron **OR** ondansetron (ZOFRAN) IV, polyethylene glycol, traMADol    Anti-infectives (From admission, onward)   Start     Dose/Rate Route Frequency Ordered Stop   06/01/20 1000  azithromycin (ZITHROMAX) tablet 250 mg     Discontinue    "Followed by" Linked Group Details   250 mg Oral Daily 05/31/20 1450 06/05/20 0959   05/31/20 1500  azithromycin (ZITHROMAX) tablet 500 mg       "Followed by" Linked Group Details   500 mg Oral Daily 05/31/20 1450 05/31/20 1519        Objective:   Vitals:   06/01/20 1137 06/01/20 1205 06/01/20 1450 06/01/20 1600  BP: (!) 175/133 (!) 161/120 (!) 164/113 (!) 183/134  Pulse:  80 79   Resp:  16 16   Temp:      TempSrc:      SpO2:  98% 97%   Weight:      Height:        Wt Readings from Last 3 Encounters:  05/30/20 93 kg    No intake or output data in the 24 hours ending 06/01/20 1849  Physical Exam  Gen:- Awake Alert, dizziness and dyspnea on exertion persist HEENT:- [place].AT, No sclera icterus Neck-Supple Neck,No JVD,.  Lungs-diminished breath sounds, no significant wheezing at this time  CV- S1, S2 normal, regular , dizziness and  dyspnea on exertion persist Abd-  +ve B.Sounds, Abd Soft, No tenderness,    Extremity/Skin:- No  edema, pedal pulses present Psych-affect is appropriate, oriented x3 Neuro-no new focal deficits, no tremors, dizziness persist   Data  Review:   Micro Results Recent Results (from the past 240 hour(s))  SARS Coronavirus 2 by RT PCR (hospital order, performed in [facility] hospital lab) Nasopharyngeal Nasopharyngeal Swab     Status: None   Collection Time: 05/30/20 12:16 PM   Specimen: Nasopharyngeal Swab  Result Value Ref Range Status   SARS Coronavirus 2 NEGATIVE NEGATIVE Final    Comment: (NOTE) SARS-CoV-2 target nucleic acids are NOT DETECTED.  The SARS-CoV-2 RNA is generally detectable in upper and lower respiratory specimens during the acute phase of infection. The lowest concentration of SARS-CoV-2 viral copies this assay can detect is 250 copies / mL. A negative result does not preclude SARS-CoV-2 infection and should not be used as the sole basis for treatment or other patient management decisions.  A negative result may occur with improper specimen collection /  handling, submission of specimen other than nasopharyngeal swab, presence of viral mutation(s) within the areas targeted by this assay, and inadequate number of viral copies (<250 copies / mL). A negative result must be combined with clinical observations, patient history, and epidemiological information.  Fact Sheet for Patients:   StrictlyIdeas.no  Fact Sheet for Healthcare Providers: BankingDealers.co.za  This test is not yet approved or  cleared by the [place] FDA and has been authorized for detection and/or diagnosis of SARS-CoV-2 by FDA under an Emergency Use Authorization (EUA).  This EUA will remain in effect (meaning this test can be used) for the duration of the COVID-19 declaration under Section 564(b)(1) of the Act, 21 U.S.C. section 360bbb-3(b)(1), unless the authorization is terminated or revoked sooner.  Performed at [facility], [address], [place], [place] [address]   SARS Coronavirus 2 by RT PCR (hospital order, performed in [facility] hospital lab) Nasopharyngeal  Nasopharyngeal Swab     Status: None   Collection Time: 05/31/20  2:45 PM   Specimen: Nasopharyngeal Swab  Result Value Ref Range Status   SARS Coronavirus 2 NEGATIVE NEGATIVE Final    Comment: (NOTE) SARS-CoV-2 target nucleic acids are NOT DETECTED.  The SARS-CoV-2 RNA is generally detectable in upper and lower respiratory specimens during the acute phase of infection. The lowest concentration of SARS-CoV-2 viral copies this assay can detect is 250 copies / mL. A negative result does not preclude SARS-CoV-2 infection and should not be used as the sole basis for treatment or other patient management decisions.  A negative result may occur with improper specimen collection / handling, submission of specimen other than nasopharyngeal swab, presence of viral mutation(s) within the areas targeted by this assay, and inadequate number of viral copies (<250 copies / mL). A negative result must be combined with clinical observations, patient history, and epidemiological information.  Fact Sheet for Patients:   StrictlyIdeas.no  Fact Sheet for Healthcare Providers: BankingDealers.co.za  This test is not yet approved or  cleared by the [place] FDA and has been authorized for detection and/or diagnosis of SARS-CoV-2 by FDA under an Emergency Use Authorization (EUA).  This EUA will remain in effect (meaning this test can be used) for the duration of the COVID-19 declaration under Section 564(b)(1) of the Act, 21 U.S.C. section 360bbb-3(b)(1), unless the authorization is terminated or revoked sooner.  Performed at [facility], [address], [place], [place] [address]     Radiology Reports CT Head Wo Contrast  Result Date: 05/30/2020 CLINICAL DATA:  Worsening headache and fatigue, hypertension EXAM: CT HEAD WITHOUT CONTRAST TECHNIQUE: Contiguous axial images were obtained from the base of the skull through the vertex without intravenous  contrast. COMPARISON:  None. FINDINGS: Brain: No evidence of acute infarction, hemorrhage, hydrocephalus, extra-axial collection or mass lesion/mass effect. Periventricular and deep white matter hypodensity (series 2, image 20). Vascular: No hyperdense vessel or unexpected calcification. Skull: Normal. Negative for fracture or focal lesion. Sinuses/Orbits: No acute finding. Other: None. IMPRESSION: 1.  No acute intracranial pathology. 2. Periventricular and deep white matter hypodensity, which may reflect hypertensive small-vessel white matter disease, but is significantly advanced for patient age. Demyelinating disorder such as multiple sclerosis is a differential consideration. Consider contrast enhanced MRI of the brain and cervical spine to further evaluate on a nonemergent basis if warranted by clinical suspicion based on signs and symptoms. Electronically Signed   By: Eddie Candle M.D.   On: 05/30/2020 13:08   CT ANGIO CHEST PE W OR  WO CONTRAST  Result Date: 05/30/2020 CLINICAL DATA:  Dyspnea, worsened with exertion.  Hypoxia. EXAM: CT ANGIOGRAPHY CHEST WITH CONTRAST TECHNIQUE: Multidetector CT imaging of the chest was performed using the standard protocol during bolus administration of intravenous contrast. Multiplanar CT image reconstructions and MIPs were obtained to evaluate the vascular anatomy. CONTRAST:  67m OMNIPAQUE IOHEXOL 350 MG/ML SOLN COMPARISON:  Chest radiograph from earlier today. FINDINGS: Cardiovascular: The study is high quality for the evaluation of pulmonary embolism. There are no filling defects in the central, lobar, segmental or subsegmental pulmonary artery branches to suggest acute pulmonary embolism. Minimally atherosclerotic thoracic aorta with ectatic 4.2 cm ascending thoracic aorta. Dilated main pulmonary artery (3.6 cm diameter). Top-normal heart size. No significant pericardial fluid/thickening. Mediastinum/Nodes: No discrete thyroid nodules. Unremarkable esophagus. No  axillary adenopathy. Multiple mildly enlarged paratracheal, left prevascular, subcarinal and bilateral hilar noncalcified lymph nodes. Representative 1.4 cm right paratracheal node (series 4/image 29). Representative 1.6 cm subcarinal node (series 4/image 44). Representative 1.4 cm right hilar node (series 4/image 41). Lungs/Pleura: No pneumothorax. No pleural effusion. No acute consolidative airspace disease, lung masses or significant pulmonary nodules. Diffuse bronchial wall thickening. Mild mosaic attenuation throughout both lungs. Upper abdomen: No acute abnormality. Musculoskeletal:  No aggressive appearing focal osseous lesions. Review of the MIP images confirms the above findings. IMPRESSION: 1. No pulmonary embolism. 2. Mild mediastinal and bilateral hilar lymphadenopathy, nonspecific. Differential include sarcoidosis or lymphoma. Management options include chest CT with IV contrast surveillance in 3 months tissue sampling or PET-CT, as clinically warranted. Suggest pulmonology consultation. 3. Dilated main pulmonary artery, suggesting pulmonary arterial hypertension. 4. Diffuse bronchial wall thickening, nonspecific, as can be seen with bronchitis or reactive airways disease. 5. Mild mosaic attenuation throughout both lungs, nonspecific, differential includes mosaic perfusion from pulmonary vascular disease versus air trapping from small airways disease. 6. 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 Recommend annual imaging followup by CTA or MRA. This recommendation follows 2010 ACCF/AHA/AATS/ACR/ASA/SCA/SCAI/SIR/STS/SVM Guidelines for the Diagnosis and Management of Patients with Thoracic Aortic Disease. Circulation. 2010; 121:: C481-Y590 Aortic aneurysm NOS (ICD10-I71.9). 7. Aortic Atherosclerosis (ICD10-I70.0). Electronically Signed   By: JIlona SorrelM.D.   On: 05/30/2020 15:57   DG Chest Port 1 View  Result Date: 05/30/2020 CLINICAL DATA:  Shortness of breath EXAM: PORTABLE CHEST 1 VIEW COMPARISON:   None. FINDINGS: The heart size and mediastinal contours are within normal limits. Mild pulmonary vascular prominence. Both lungs are clear. The visualized skeletal structures are unremarkable. IMPRESSION: Mild pulmonary vascular prominence without overt pulmonary edema. No acute appearing airspace opacity. No pleural effusion. Electronically Signed   By: AEddie CandleM.D.   On: 05/30/2020 13:03   ECHOCARDIOGRAM COMPLETE  Result Date: 06/01/2020    ECHOCARDIOGRAM REPORT   Patient Name:   Ricardo DOLLARDDate of Exam: 05/31/2020 Medical Rec #:  [id number]     Height:       72.0 in Accession #:    [id number]    Weight:       205.0 lb Date of Birth:  920-Nov-1982     BSA:          2.153 m Patient Age:    89years       BP:           165/109 mmHg Patient Gender: M              HR:           80 bpm. [facility]  [facility]D Echo, Cardiac Doppler and Color Doppler Indications:    Abnormal EKG  History:        Patient has no prior history of Echocardiogram examinations.                 Signs/Symptoms:Shortness of Breath; Risk Factors:Hypertension.  Sonographer:    Dustin Flock RDCS Referring Phys: [place]  1. Difficult to accurately assess LVEF as endocardium is difficult to see. WOuld recomm limited echo with Definity to define this and help in assess of wall motion/ejection fractiion. Left ventricular endocardial border not optimally defined to evaluate  regional wall motion. There is severe left ventricular hypertrophy. Left ventricular diastolic parameters are indeterminate.  2. Right ventricular systolic function is normal. The right ventricular size is normal. There is normal pulmonary artery systolic pressure.  3. Left atrial size was mildly dilated.  4. The mitral valve is normal in structure. Trivial mitral valve regurgitation.  5. The aortic valve is normal in structure. Aortic valve regurgitation is not visualized.  6. The inferior vena cava is dilated in size  with >50% respiratory variability, suggesting right atrial pressure of 8 mmHg. FINDINGS  Left Ventricle: Difficult to accurately assess LVEF as endocardium is difficult to see. WOuld recomm limited echo with Definity to define this and help in assess of wall motion/ejection fraction. Left ventricular ejection fraction, by estimation, is see  below%. The left ventricle has see below function. Left ventricular endocardial border not optimally defined to evaluate regional wall motion. The left ventricular internal cavity size was normal in size. There is severe left ventricular hypertrophy. Left ventricular diastolic parameters are indeterminate. Right Ventricle: The right ventricular size is normal. Right vetricular wall thickness was not assessed. Right ventricular systolic function is normal. There is normal pulmonary artery systolic pressure. The tricuspid regurgitant velocity is 2.64 m/s, and with an assumed right atrial pressure of 3 mmHg, the estimated right ventricular systolic pressure is 32.3 mmHg. Left Atrium: Left atrial size was mildly dilated. Right Atrium: Right atrial size was normal in size. Pericardium: There is no evidence of pericardial effusion. Mitral Valve: The mitral valve is normal in structure. Trivial mitral valve regurgitation. Tricuspid Valve: The tricuspid valve is normal in structure. Tricuspid valve regurgitation is trivial. Aortic Valve: The aortic valve is normal in structure. Aortic valve regurgitation is not visualized. Pulmonic Valve: The pulmonic valve was not well visualized. Pulmonic valve regurgitation is not visualized. No evidence of pulmonic stenosis. Aorta: The aortic root and ascending aorta are structurally normal, with no evidence of dilitation. Venous: The inferior vena cava is dilated in size with greater than 50% respiratory variability, suggesting right atrial pressure of 8 mmHg. IAS/Shunts: The interatrial septum was not assessed.  LEFT VENTRICLE PLAX 2D LVIDd:          4.68 cm  Diastology LVIDs:         3.41 cm  LV e' lateral:   7.18 cm/s LV PW:         2.08 cm  LV E/e' lateral: 12.1 LV IVS:        1.98 cm  LV e' medial:    5.87 cm/s LVOT diam:     2.80 cm  LV E/e' medial:  14.8 LV SV:         118 LV SV Index:   55 LVOT Area:     6.16 cm  RIGHT VENTRICLE RV Basal diam:  4.17 cm RV S prime:     10.40 cm/s  TAPSE (M-mode): 3.2 cm LEFT ATRIUM             Index       RIGHT ATRIUM           Index LA diam:        4.80 cm 2.23 cm/m  RA Area:     19.10 cm LA Vol (A2C):   78.8 ml 36.60 ml/m RA Volume:   58.80 ml  27.31 ml/m LA Vol (A4C):   84.0 ml 39.01 ml/m LA Biplane Vol: 84.3 ml 39.15 ml/m  AORTIC VALVE LVOT Vmax:   96.00 cm/s LVOT Vmean:  69.600 cm/s LVOT VTI:    0.191 m  AORTA Ao Root diam: 3.40 cm MITRAL VALVE               TRICUSPID VALVE MV Area (PHT): 3.26 cm    TR Peak grad:   27.9 mmHg MV Decel Time: 233 msec    TR Vmax:        264.00 cm/s MV E velocity: 87.00 cm/s MV A velocity: 46.70 cm/s  SHUNTS MV E/A ratio:  1.86        Systemic VTI:  0.19 m                            Systemic Diam: 2.80 cm Dorris Carnes MD Electronically signed by Dorris Carnes MD Signature Date/Time: 06/01/2020/12:02:16 AM    Final    ECHOCARDIOGRAM LIMITED  Result Date: 06/01/2020    ECHOCARDIOGRAM LIMITED REPORT   Patient Name:   Ricardo Evans Date of Exam: 06/01/2020 Medical Rec #:  [id number]      Height:       72.0 in Accession #:    [id number]     Weight:       205.0 lb Date of Birth:  August 28, 1981      BSA:          2.153 m Patient Age:    55 years       BP:           162/110 mmHg Patient Gender: M              HR:           81 bpm. [facility] Procedure: Limited Echo Indications:    Dyspnea 786.09 / R06.00  History:        Patient has prior history of Echocardiogram examinations, most                 recent 05/31/2020. Hypertensive urgency, Acute respiratory failure                 with hypoxemia.  Sonographer:    Alvino Chapel RCS Referring Phys: [phone] Shailene Demonbreun IMPRESSIONS   1. Left ventricular ejection fraction, by estimation, is 45 to 50%. The left ventricle has mildly decreased function. The left ventricle demonstrates global hypokinesis. The left ventricular internal cavity size was mildly dilated. There is severe left ventricular hypertrophy. Left ventricular diastolic function could not be evaluated.  2. Right ventricular systolic function is normal. The right ventricular size is normal.  3. Left atrial size was moderately dilated.  4. The mitral valve is normal in structure. No evidence of mitral valve regurgitation. No evidence of mitral stenosis.  5. The aortic valve is normal in structure. Aortic valve regurgitation is not visualized. No aortic stenosis is present.  6. The inferior vena cava is  normal in size with greater than 50% respiratory variability, suggesting right atrial pressure of 3 mmHg. FINDINGS  Left Ventricle: Left ventricular ejection fraction, by estimation, is 45 to 50%. The left ventricle has mildly decreased function. The left ventricle demonstrates global hypokinesis. Definity contrast agent was given IV to delineate the left ventricular  endocardial borders. The left ventricular internal cavity size was mildly dilated. There is severe left ventricular hypertrophy. Right Ventricle: The right ventricular size is normal. No increase in right ventricular wall thickness. Right ventricular systolic function is normal. Left Atrium: Left atrial size was moderately dilated. Right Atrium: Right atrial size was normal in size. Pericardium: There is no evidence of pericardial effusion. Mitral Valve: The mitral valve is normal in structure. Normal mobility of the mitral valve leaflets. No evidence of mitral valve stenosis. Tricuspid Valve: The tricuspid valve is normal in structure. Tricuspid valve regurgitation is not demonstrated. No evidence of tricuspid stenosis. Aortic Valve: The aortic valve is normal in structure. Aortic valve regurgitation is not visualized.  No aortic stenosis is present. Pulmonic Valve: The pulmonic valve was normal in structure. Pulmonic valve regurgitation is not visualized. No evidence of pulmonic stenosis. Aorta: The aortic root is normal in size and structure. Venous: The inferior vena cava is normal in size with greater than 50% respiratory variability, suggesting right atrial pressure of 3 mmHg. IAS/Shunts: No atrial level shunt detected by color flow Doppler. LEFT VENTRICLE PLAX 2D LVIDd:         5.07 cm LVIDs:         3.85 cm LV PW:         2.00 cm LV IVS:        1.73 cm LVOT diam:     2.30 cm LVOT Area:     4.15 cm  LV Volumes (MOD) LV vol d, MOD A2C: 130.0 ml LV vol d, MOD A4C: 121.0 ml LV vol s, MOD A2C: 65.8 ml LV vol s, MOD A4C: 72.1 ml LV SV MOD A2C:     64.2 ml LV SV MOD A4C:     121.0 ml LV SV MOD BP:      59.8 ml LEFT ATRIUM         Index LA diam:    5.00 cm 2.32 cm/m   AORTA Ao Root diam: 3.50 cm  SHUNTS Systemic Diam: 2.30 cm Jenkins Rouge MD Electronically signed by Jenkins Rouge MD Signature Date/Time: 06/01/2020/1:16:38 PM    Final      CBC Recent Labs  Lab 05/30/20 1224  WBC 7.0  HGB 14.2  HCT 42.4  PLT 171  MCV 92.0  MCH 30.8  MCHC 33.5  RDW 13.7  LYMPHSABS 0.9  MONOABS 0.6  EOSABS 0.0  BASOSABS 0.0    Chemistries  Recent Labs  Lab 05/30/20 1224 05/30/20 1547  NA 136  --   K 3.1*  --   CL 98  --   CO2 25  --   GLUCOSE 126*  --   BUN 12  --   CREATININE 1.23  --   CALCIUM 8.7*  --   MG  --  1.7  AST 24  --   ALT 23  --   ALKPHOS 85  --   BILITOT 1.5*  --    ------------------------------------------------------------------------------------------------------------------ No results for input(s): CHOL, HDL, LDLCALC, TRIG, CHOLHDL, LDLDIRECT in the last 72 hours.  No results found for: HGBA1C ------------------------------------------------------------------------------------------------------------------ No results for input(s): TSH, T4TOTAL, T3FREE, THYROIDAB in the last 72  hours.  Invalid input(s): FREET3 ------------------------------------------------------------------------------------------------------------------ No results for input(s): VITAMINB12, FOLATE, FERRITIN, TIBC, IRON, RETICCTPCT in the last 72 hours.  Coagulation profile No results for input(s): INR, PROTIME in the last 168 hours.  No results for input(s): DDIMER in the last 72 hours.  Cardiac Enzymes No results for input(s): CKMB, TROPONINI, MYOGLOBIN in the last 168 hours.  Invalid input(s): CK ------------------------------------------------------------------------------------------------------------------    Component Value Date/Time   BNP 265.0 (H) 05/30/2020 1224     Roxan Hockey M.D on 06/01/2020 at 6:49 PM  Go to [email or web address] - for contact info  [facility] - Office  [phone]

## 2020-06-02 ENCOUNTER — Inpatient Hospital Stay (HOSPITAL_COMMUNITY): Payer: Self-pay

## 2020-06-02 LAB — QUANTIFERON-TB GOLD PLUS

## 2020-06-02 IMAGING — MR MR MRA ABDOMEN W/ OR W/O CM
3 of 5 series · 39 of 48 positions shown · IV contrast (9 ml Gadavist)
Comparison: None.

CLINICAL DATA: Hypertension.

EXAM:
MRA ABDOMEN AND PELVIS WITH CONTRAST
TECHNIQUE: Multiplanar, multiecho pulse sequences of the abdomen and pelvis
were obtained with intravenous contrast. Angiographic images of
abdomen and pelvis were obtained using MRA technique with
intravenous contrast.
CONTRAST:  10mL GADAVIST GADOBUTROL 1 MMOL/ML IV SOLN

[Series 3: angio_fl3d_cor_pre · coronal · 1.1mm · 1.17mm/px · 13 of 112 slices shown]
[im 1/112]
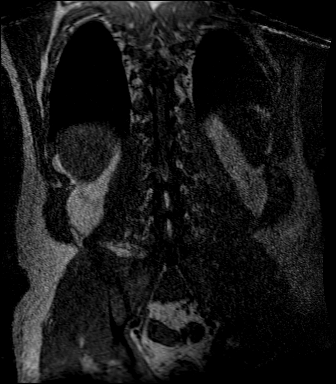
[im 10/112]
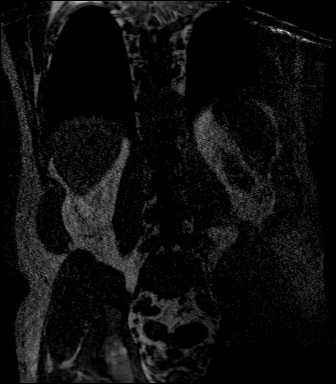
[im 19/112]
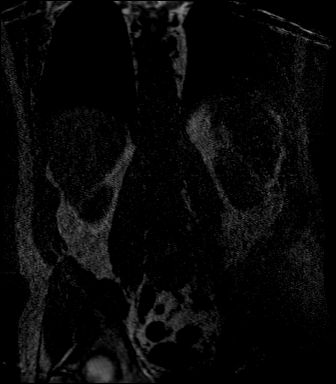
[im 28/112]
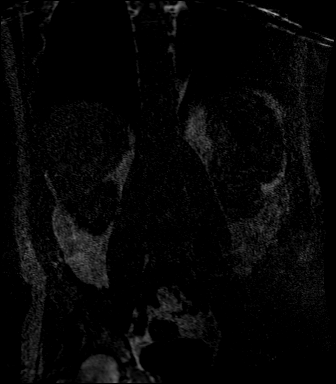
[im 38/112]
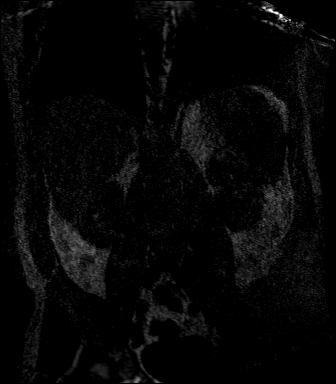
[im 47/112]
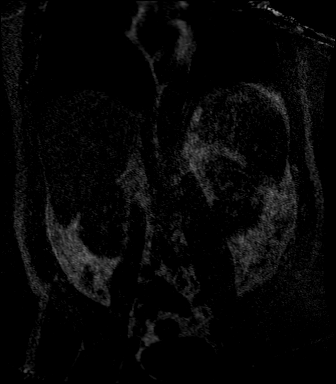
[im 56/112]
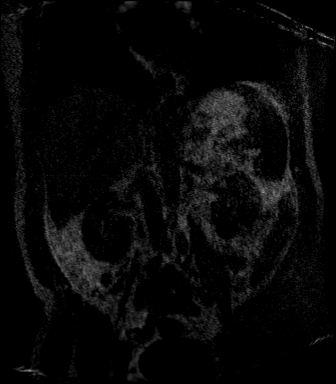
[im 65/112]
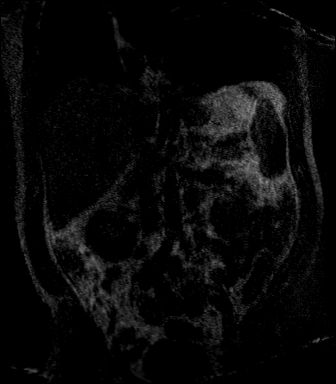
[im 75/112]
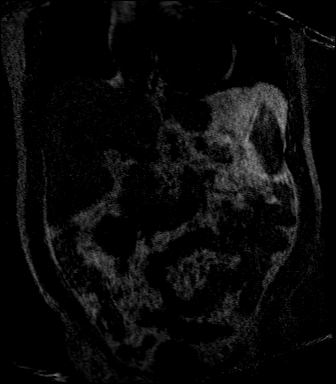
[im 84/112]
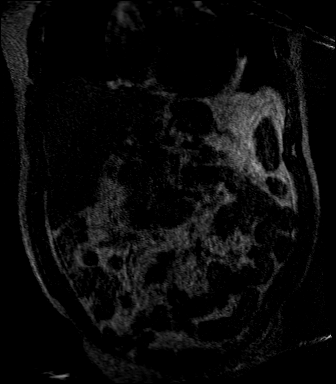
[im 93/112]
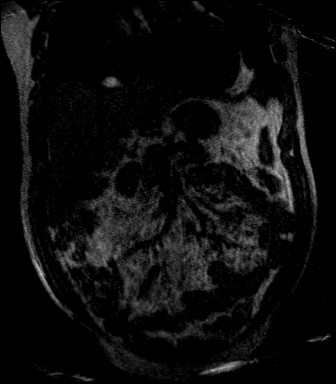
[im 102/112]
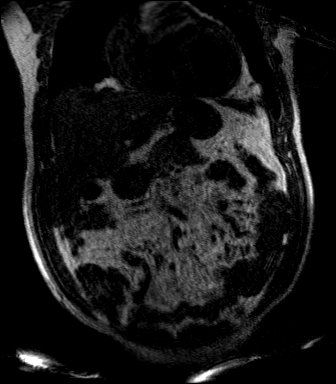
[im 112/112]
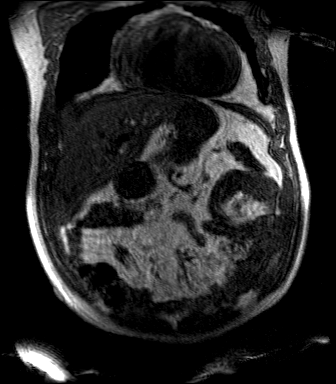

[Series 5: angio_fl3d_cor_post · coronal · 1.1mm · 1.17mm/px · 13 of 112 slices shown]
[im 1/112]
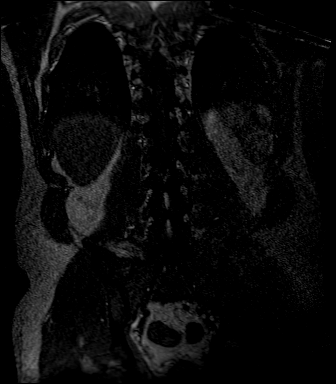
[im 10/112]
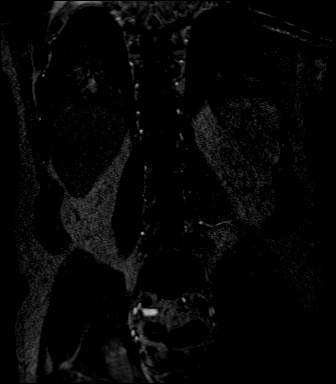
[im 19/112]
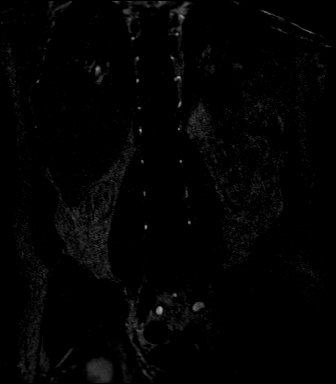
[im 28/112]
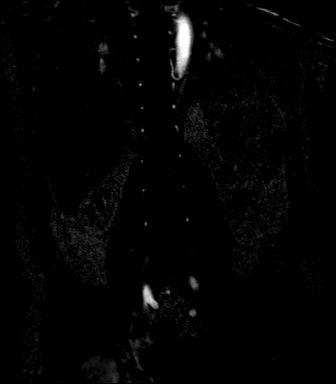
[im 38/112]
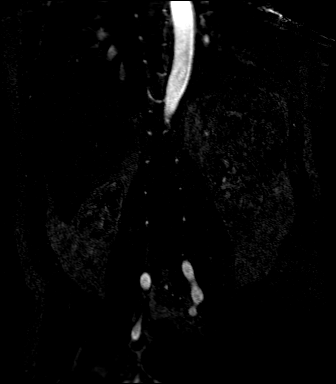
[im 47/112]
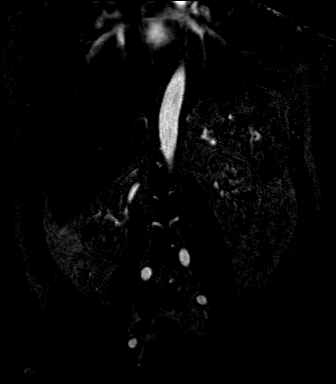
[im 56/112]
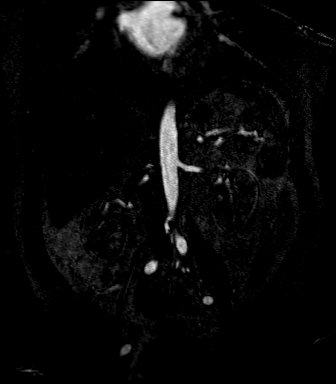
[im 65/112]
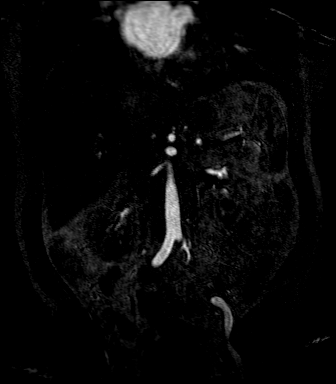
[im 75/112]
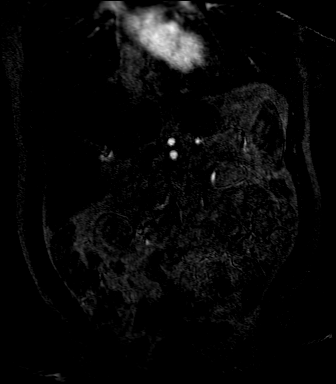
[im 84/112]
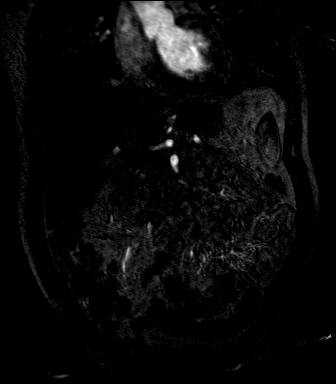
[im 93/112]
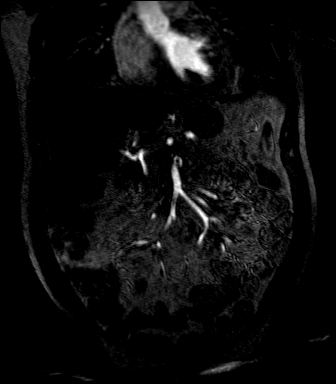
[im 102/112]
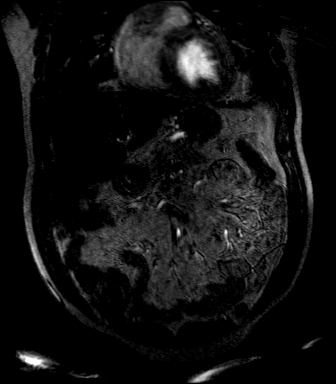
[im 112/112]
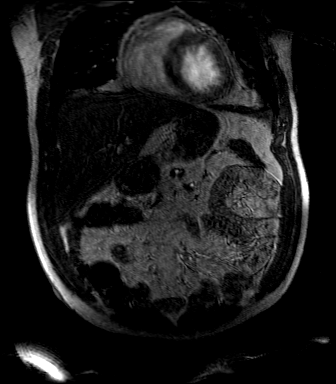

[Series 6: angio_fl3d_cor_post_sub · coronal · 1.1mm · 1.17mm/px · 13 of 110 slices shown]
[im 1/110]
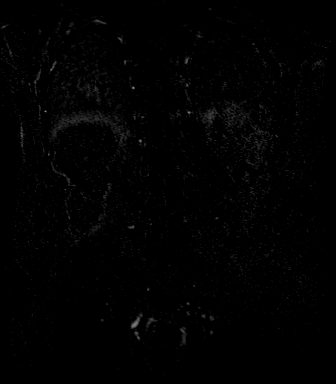
[im 10/110]
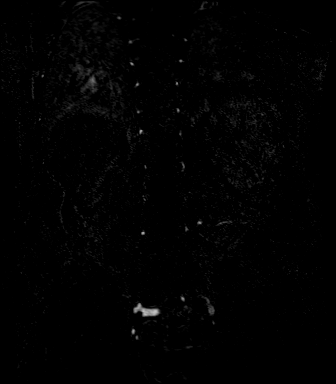
[im 19/110]
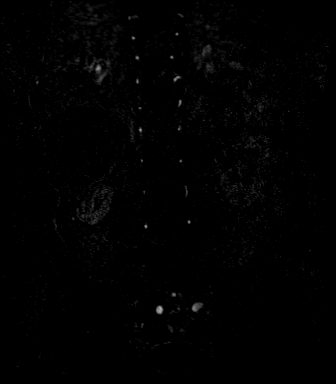
[im 28/110]
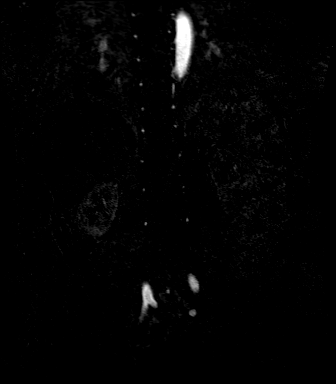
[im 37/110]
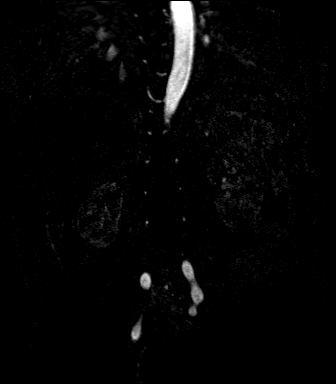
[im 46/110]
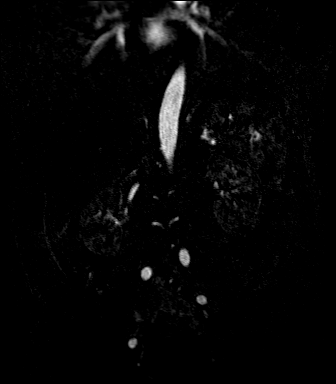
[im 55/110]
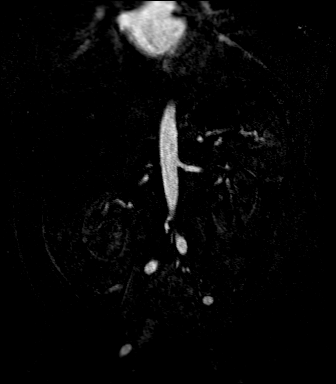
[im 64/110]
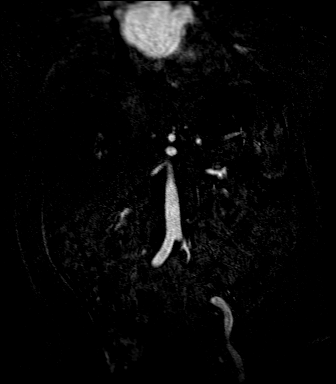
[im 73/110]
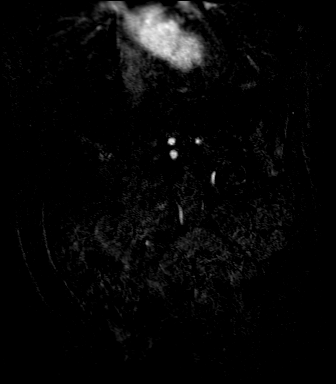
[im 82/110]
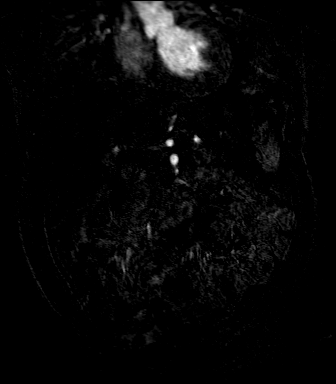
[im 91/110]
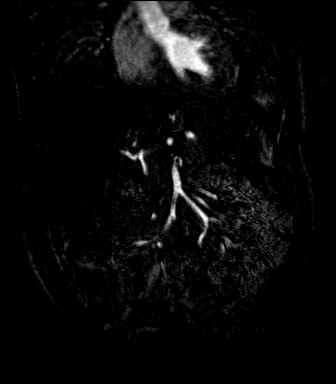
[im 100/110]
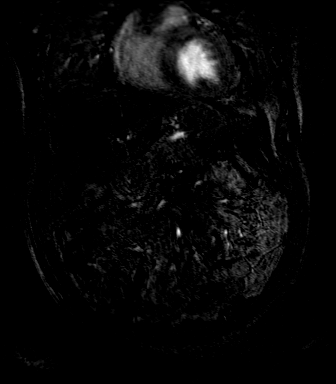
[im 110/110]
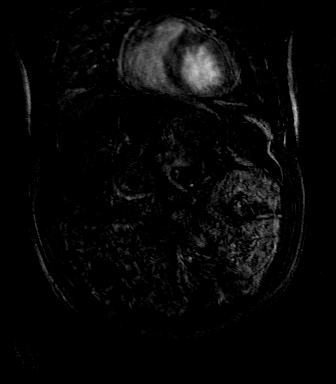

[39 of 48 positions shown; findings below may reference images not displayed]

FINDINGS: MRA ABDOMEN FINDINGS

The visualized portions of the solid organs are unremarkable,
however they are suboptimally evaluated secondary to motion artifact
and lack of multiple sequences to allow for adequate evaluation. The
heart size appears to be a least mildly enlarged.

The aorta is unremarkable without evidence for an aneurysm. The
celiac axis is widely patent. The SMA is widely patent. The IMA is
widely patent.

There are 2 right renal arteries, both of which appear to be widely
patent. There is a single left renal artery that is widely patent.

The iliac vasculature is unremarkable where visualized.
IMPRESSION: 1. Widely patent bilateral renal arteries without evidence for a
high-grade stenosis.
2. At least mild cardiomegaly is noted.

## 2020-06-02 MED ORDER — LOSARTAN POTASSIUM 50 MG PO TABS: 50.0000 mg | ORAL_TABLET | Freq: Every day | ORAL | 3 refills | Status: DC

## 2020-06-02 MED ORDER — HYDRALAZINE HCL 50 MG PO TABS: 50.0000 mg | ORAL_TABLET | Freq: Three times a day (TID) | ORAL | 3 refills | Status: DC

## 2020-06-02 MED ORDER — ACETAMINOPHEN 325 MG PO TABS: 650.0000 mg | ORAL_TABLET | Freq: Four times a day (QID) | ORAL | 0 refills | Status: DC | PRN

## 2020-06-02 MED ORDER — CARVEDILOL 12.5 MG PO TABS: 12.5000 mg | ORAL_TABLET | Freq: Two times a day (BID) | ORAL | 3 refills | Status: DC

## 2020-06-02 MED ORDER — AMLODIPINE BESYLATE 10 MG PO TABS: 10.0000 mg | ORAL_TABLET | Freq: Every day | ORAL | 3 refills | Status: DC

## 2020-06-02 MED ORDER — HYDROXYZINE HCL 25 MG PO TABS: 25.0000 mg | ORAL_TABLET | Freq: Every evening | ORAL | 1 refills | Status: DC | PRN

## 2020-06-02 MED ORDER — GADOBUTROL 1 MMOL/ML IV SOLN
10.0000 mL | Freq: Once | INTRAVENOUS | Status: AC | PRN
  Administered 2020-06-02: 10 mL via INTRAVENOUS

## 2020-06-02 NOTE — Discharge Summary (Signed)
Ricardo Evans, is a 39 y.o. male  DOB [birth year]  MRN [id number].  Admission date:  05/30/2020  Admitting Physician  Roxan Hockey, MD  Discharge Date:  06/02/2020   Primary MD  Rory Percy, MD  Recommendations for primary care physician for things to follow:    1)Very low-salt diet advised 2) please check your blood pressure 2-3 times a week and please keep a diary and take the diary/log with you when you go to see a cardiologist 3) please follow-up with cardiologist as advised for further management of your elevated blood pressure and concerns about enlargement of the heart from elevated blood pressure [facility] with Dr. Percival Spanish on 06/18/2020 at 10:40 AM at the [facility].   Admission Diagnosis  Dyspnea [R06.00] Hypertensive urgency [I16.0] Dyspnea and respiratory abnormalities [R06.00, R06.89]   Discharge Diagnosis  Dyspnea [R06.00] Hypertensive urgency [I16.0] Dyspnea and respiratory abnormalities [R06.00, R06.89]    Principal Problem:   Hypertensive urgency Active Problems:   Dyspnea   Acute respiratory failure with hypoxemia (HCC)   Hilar adenopathy   Dyspnea and respiratory abnormalities      History reviewed. No pertinent past medical history.  History reviewed. No pertinent surgical history.    HPI  from the history and physical done on the day of admission:    Chief Complaint: Difficulty breathing, headaches, fatigue  HPI: Ricardo Evans is a 39 y.o. male with history of hypertension, presented to the ED with complaints of headache, difficulty breathing, fatigue. Patient reports headache started 2 to 3 days ago, mostly involving the sides and front of his head.  Reports difficulty breathing has been ongoing since April when he got his second Covid vaccine, has been gradual and progressed, he came to the ED now because he could not take it anymore.   Breathing is worse with activity, and when he bends over, and occasionally when he is lying down flat.  He is most comfortable sitting up.  Reports swelling of his ankles about a week ago which has resolved, no swelling now.  Occasional abdominal bloating, mild weight gain but nothing significant.  He denies chest pain.  No cough.  No fevers or chills.  Remote history of smoking cigarettes, very occasional/social alcohol intake. Reports heart disease in grandparents, no heart disease in parents, tells me he has no siblings.  Reports his father is currently requiring oxygen.  He denies any known history of multiple sclerosis or neurologic disorders in his family. Patient used to be on lisinopril, but he lost history of and so stopped the medication.  ED Course: Blood pressure 222/142, temperature 98.6, heart rate 80s to low 100s.  O2 sats dropped to 88 percent, placed on 3 L O2.  Temperature 98.6.  BNP 265.  Potassium 3.1.  Covid test negative.  EKG showing inferior lateral T wave inversions.   Portable chest x-ray showing pulmonary vascular prominence. 40 mg IV labetalol given.  Hospitalist to admit for acute respiratory failure, elevated blood pressure.  Review of Systems:  As per HPI all other systems reviewed and negative.    Hospital Course:   Brief Summary:- 39 year old reformed smoker with past medical history relevant for HTN admitted on 05/30/2020 with concerns about  hypertensive urgency with headache with blood pressure of 222/142  and found to have acute hypoxic respiratory failure and CTA chest findings concerning for sarcoid versus lymphoma, as well as concerns for possible pulmonary hypertension ---- Given persistently high BP despite multiple antihypertensive agents) a young patient who want to recently had no diagnosis of hypertension we need to rule out secondary causes of hypertension rather than SAMe the patient has essential hypertension -MRA abdomen with attention to renal  artery without renal artery stenosis -Patient's hypertension is persistent and not episodic -AM cortisol level would be beneficial   A/p 1)Acute hypoxic respiratory failure----CTA chest without PE, 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 with bilateral hilar and mediastinal lymphadenopathy - -BNP and troponins not consistent with ACS or significant heart failure per se --pulmonology consult appreciated -Weaned off oxygen -CTA chest also suggestive of possible bronchitis, -Continue bronchodilators, mucolytics and azithromycin empirically -Repeat Covid test negative -Please note that the patient was vaccinated against COVID-19 infection back in April 2021 -ESR is 27 -ACE level not elevated -Pulmonologist requested QuantiFERON Gold -Outpatient follow-up pulmonologist advised  2)HFrEF--- Echo with EF of 45 to 50% with global hypokinesis and severe LVH--- 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 -BP control has improved  3)HTN--- admitted with hypertensive urgency with headache with blood pressure of 222/142 initially, - BP still Not at goal,  -We Increased Coreg to 12.5 mg twice daily,  - amlodipine increased to 10 mg daily  IV labetalol as needed elevated BP -With better BP control patient had resolution of dyspnea on exertion and dizziness --- Given persistently high BP despite multiple antihypertensive agents) a young patient who want to recently had no diagnosis of hypertension we need to rule out secondary causes of hypertension  -MRA abdomen with attention to renal artery without evidence of renal artery stenosis -Patient's hypertension is persistent and not episodic -AM cortisol level would be beneficial -Discharge on amlodipine 10 mg daily, losartan 50 mg daily, Coreg 12.5 mg twice daily, and hydralazine 50 mg 3 times daily --Outpatient follow-up with Dr. Percival Spanish the cardiologist on 06/18/2020 advised  Disposition--discharge home with significant other  Code  Status : Full   Family Communication:    (patient is alert, awake and coherent)   Consults  :  Pulm  Discharge Condition: stable  Follow UP   Follow-up Information    [place] Follow up on 06/18/2020.   Specialty: Cardiology Why: Cardiology Hospital Follow-up with Dr. Percival Spanish on 06/18/2020 at 10:40 AM.  Contact information: [place] [phone]              Diet and Activity recommendation:  As advised  Discharge Instructions    Discharge Instructions    Call MD for:  difficulty breathing, headache or visual disturbances   Complete by: As directed    Call MD for:  persistant dizziness or light-headedness   Complete by: As directed    Call MD for:  persistant nausea and vomiting   Complete by: As directed    Call MD for:  severe uncontrolled pain   Complete by: As directed    Call MD for:  temperature >100.4   Complete by: As directed    Diet - low sodium heart healthy  Complete by: As directed    Discharge instructions   Complete by: As directed    1)Very low-salt diet advised 2) please check your blood pressure 2-3 times a week and please keep a diary and take the diary/log with you when you go to see a cardiologist 3) please follow-up with cardiologist as advised for further management of your elevated blood pressure and concerns about enlargement of the heart from elevated blood pressure [facility] with Dr. Percival Spanish on 06/18/2020 at 10:40 AM at the [facility].   Increase activity slowly   Complete by: As directed         Discharge Medications     Allergies as of 06/02/2020      Reactions   Cephalexin    Penicillins    Sulfonamide Derivatives       Medication List    TAKE these medications   acetaminophen 325 MG tablet Commonly known as: TYLENOL Take 2 tablets (650 mg total) by mouth every 6 (six) hours as needed for mild pain (or Fever >/=  101).   amLODipine 10 MG tablet Commonly known as: NORVASC Take 1 tablet (10 mg total) by mouth daily. For BP Start taking on: June 03, 2020   carvedilol 12.5 MG tablet Commonly known as: COREG Take 1 tablet (12.5 mg total) by mouth 2 (two) times daily with a meal. For BP Start taking on: June 03, 2020   hydrALAZINE 50 MG tablet Commonly known as: APRESOLINE Take 1 tablet (50 mg total) by mouth 3 (three) times daily. For BP   hydrOXYzine 25 MG tablet Commonly known as: ATARAX/VISTARIL Take 1 tablet (25 mg total) by mouth at bedtime as needed.   losartan 50 MG tablet Commonly known as: COZAAR Take 1 tablet (50 mg total) by mouth daily. For BP       Major procedures and Radiology Reports - PLEASE review detailed and final reports for all details, in brief -    CT Head Wo Contrast  Result Date: 05/30/2020 CLINICAL DATA:  Worsening headache and fatigue, hypertension EXAM: CT HEAD WITHOUT CONTRAST TECHNIQUE: Contiguous axial images were obtained from the base of the skull through the vertex without intravenous contrast. COMPARISON:  None. FINDINGS: Brain: No evidence of acute infarction, hemorrhage, hydrocephalus, extra-axial collection or mass lesion/mass effect. Periventricular and deep white matter hypodensity (series 2, image 20). Vascular: No hyperdense vessel or unexpected calcification. Skull: Normal. Negative for fracture or focal lesion. Sinuses/Orbits: No acute finding. Other: None. IMPRESSION: 1.  No acute intracranial pathology. 2. Periventricular and deep white matter hypodensity, which may reflect hypertensive small-vessel white matter disease, but is significantly advanced for patient age. Demyelinating disorder such as multiple sclerosis is a differential consideration. Consider contrast enhanced MRI of the brain and cervical spine to further evaluate on a nonemergent basis if warranted by clinical suspicion based on signs and symptoms. Electronically Signed   By: Eddie Candle M.D.   On: 05/30/2020 13:08   CT ANGIO CHEST PE W OR WO CONTRAST  Result Date: 05/30/2020 CLINICAL DATA:  Dyspnea, worsened with exertion.  Hypoxia. EXAM: CT ANGIOGRAPHY CHEST WITH CONTRAST TECHNIQUE: Multidetector CT imaging of the chest was performed using the standard protocol during bolus administration of intravenous contrast. Multiplanar CT image reconstructions and MIPs were obtained to evaluate the vascular anatomy. CONTRAST:  62m OMNIPAQUE IOHEXOL 350 MG/ML SOLN COMPARISON:  Chest radiograph from earlier today. FINDINGS: Cardiovascular: The study is high quality for the evaluation of pulmonary embolism. There are no  filling defects in the central, lobar, segmental or subsegmental pulmonary artery branches to suggest acute pulmonary embolism. Minimally atherosclerotic thoracic aorta with ectatic 4.2 cm ascending thoracic aorta. Dilated main pulmonary artery (3.6 cm diameter). Top-normal heart size. No significant pericardial fluid/thickening. Mediastinum/Nodes: No discrete thyroid nodules. Unremarkable esophagus. No axillary adenopathy. Multiple mildly enlarged paratracheal, left prevascular, subcarinal and bilateral hilar noncalcified lymph nodes. Representative 1.4 cm right paratracheal node (series 4/image 29). Representative 1.6 cm subcarinal node (series 4/image 44). Representative 1.4 cm right hilar node (series 4/image 41). Lungs/Pleura: No pneumothorax. No pleural effusion. No acute consolidative airspace disease, lung masses or significant pulmonary nodules. Diffuse bronchial wall thickening. Mild mosaic attenuation throughout both lungs. Upper abdomen: No acute abnormality. Musculoskeletal:  No aggressive appearing focal osseous lesions. Review of the MIP images confirms the above findings. IMPRESSION: 1. No pulmonary embolism. 2. Mild mediastinal and bilateral hilar lymphadenopathy, nonspecific. Differential include sarcoidosis or lymphoma. Management options include chest CT with IV  contrast surveillance in 3 months tissue sampling or PET-CT, as clinically warranted. Suggest pulmonology consultation. 3. Dilated main pulmonary artery, suggesting pulmonary arterial hypertension. 4. Diffuse bronchial wall thickening, nonspecific, as can be seen with bronchitis or reactive airways disease. 5. Mild mosaic attenuation throughout both lungs, nonspecific, differential includes mosaic perfusion from pulmonary vascular disease versus air trapping from small airways disease. 6. 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 Recommend annual imaging followup by CTA or MRA. This recommendation follows 2010 ACCF/AHA/AATS/ACR/ASA/SCA/SCAI/SIR/STS/SVM Guidelines for the Diagnosis and Management of Patients with Thoracic Aortic Disease. Circulation. 2010; 121: K093-G182. Aortic aneurysm NOS (ICD10-I71.9). 7. Aortic Atherosclerosis (ICD10-I70.0). Electronically Signed   By: Ilona Sorrel M.D.   On: 05/30/2020 15:57   DG Chest Port 1 View  Result Date: 05/30/2020 CLINICAL DATA:  Shortness of breath EXAM: PORTABLE CHEST 1 VIEW COMPARISON:  None. FINDINGS: The heart size and mediastinal contours are within normal limits. Mild pulmonary vascular prominence. Both lungs are clear. The visualized skeletal structures are unremarkable. IMPRESSION: Mild pulmonary vascular prominence without overt pulmonary edema. No acute appearing airspace opacity. No pleural effusion. Electronically Signed   By: Eddie Candle M.D.   On: 05/30/2020 13:03   ECHOCARDIOGRAM COMPLETE  Result Date: 06/01/2020    ECHOCARDIOGRAM REPORT   Patient Name:   Ricardo Evans Date of Exam: 05/31/2020 Medical Rec #:  [id number]      Height:       72.0 in Accession #:    [id number]     Weight:       205.0 lb Date of Birth:  17-Oct-1981      BSA:          2.153 m Patient Age:    14 years       BP:           165/109 mmHg Patient Gender: M              HR:           80 bpm. Exam Location:  [facility] Procedure: 2D Echo, Cardiac Doppler and Color Doppler  Indications:    Abnormal EKG  History:        Patient has no prior history of Echocardiogram examinations.                 Signs/Symptoms:Shortness of Breath; Risk Factors:Hypertension.  Sonographer:    Dustin Flock RDCS Referring Phys: [place]  1. Difficult to accurately assess LVEF as endocardium is difficult to see. WOuld recomm limited echo with  Definity to define this and help in assess of wall motion/ejection fractiion. Left ventricular endocardial border not optimally defined to evaluate  regional wall motion. There is severe left ventricular hypertrophy. Left ventricular diastolic parameters are indeterminate.  2. Right ventricular systolic function is normal. The right ventricular size is normal. There is normal pulmonary artery systolic pressure.  3. Left atrial size was mildly dilated.  4. The mitral valve is normal in structure. Trivial mitral valve regurgitation.  5. The aortic valve is normal in structure. Aortic valve regurgitation is not visualized.  6. The inferior vena cava is dilated in size with >50% respiratory variability, suggesting right atrial pressure of 8 mmHg. FINDINGS  Left Ventricle: Difficult to accurately assess LVEF as endocardium is difficult to see. WOuld recomm limited echo with Definity to define this and help in assess of wall motion/ejection fraction. Left ventricular ejection fraction, by estimation, is see  below%. The left ventricle has see below function. Left ventricular endocardial border not optimally defined to evaluate regional wall motion. The left ventricular internal cavity size was normal in size. There is severe left ventricular hypertrophy. Left ventricular diastolic parameters are indeterminate. Right Ventricle: The right ventricular size is normal. Right vetricular wall thickness was not assessed. Right ventricular systolic function is normal. There is normal pulmonary artery systolic pressure. The tricuspid regurgitant  velocity is 2.64 m/s, and with an assumed right atrial pressure of 3 mmHg, the estimated right ventricular systolic pressure is 76.7 mmHg. Left Atrium: Left atrial size was mildly dilated. Right Atrium: Right atrial size was normal in size. Pericardium: There is no evidence of pericardial effusion. Mitral Valve: The mitral valve is normal in structure. Trivial mitral valve regurgitation. Tricuspid Valve: The tricuspid valve is normal in structure. Tricuspid valve regurgitation is trivial. Aortic Valve: The aortic valve is normal in structure. Aortic valve regurgitation is not visualized. Pulmonic Valve: The pulmonic valve was not well visualized. Pulmonic valve regurgitation is not visualized. No evidence of pulmonic stenosis. Aorta: The aortic root and ascending aorta are structurally normal, with no evidence of dilitation. Venous: The inferior vena cava is dilated in size with greater than 50% respiratory variability, suggesting right atrial pressure of 8 mmHg. IAS/Shunts: The interatrial septum was not assessed.  LEFT VENTRICLE PLAX 2D LVIDd:         4.68 cm  Diastology LVIDs:         3.41 cm  LV e' lateral:   7.18 cm/s LV PW:         2.08 cm  LV E/e' lateral: 12.1 LV IVS:        1.98 cm  LV e' medial:    5.87 cm/s LVOT diam:     2.80 cm  LV E/e' medial:  14.8 LV SV:         118 LV SV Index:   55 LVOT Area:     6.16 cm  RIGHT VENTRICLE RV Basal diam:  4.17 cm RV S prime:     10.40 cm/s TAPSE (M-mode): 3.2 cm LEFT ATRIUM             Index       RIGHT ATRIUM           Index LA diam:        4.80 cm 2.23 cm/m  RA Area:     19.10 cm LA Vol (A2C):   78.8 ml 36.60 ml/m RA Volume:   58.80 ml  27.31 ml/m LA Vol (A4C):   84.0 ml  39.01 ml/m LA Biplane Vol: 84.3 ml 39.15 ml/m  AORTIC VALVE LVOT Vmax:   96.00 cm/s LVOT Vmean:  69.600 cm/s LVOT VTI:    0.191 m  AORTA Ao Root diam: 3.40 cm MITRAL VALVE               TRICUSPID VALVE MV Area (PHT): 3.26 cm    TR Peak grad:   27.9 mmHg MV Decel Time: 233 msec    TR Vmax:         264.00 cm/s MV E velocity: 87.00 cm/s MV A velocity: 46.70 cm/s  SHUNTS MV E/A ratio:  1.86        Systemic VTI:  0.19 m                            Systemic Diam: 2.80 cm Dorris Carnes MD Electronically signed by Dorris Carnes MD Signature Date/Time: 06/01/2020/12:02:16 AM    Final    ECHOCARDIOGRAM LIMITED  Result Date: 06/01/2020    ECHOCARDIOGRAM LIMITED REPORT   Patient Name:   Ricardo Evans Date of Exam: 06/01/2020 Medical Rec #:  [id number]      Height:       72.0 in Accession #:    [id number]     Weight:       205.0 lb Date of Birth:  1981/06/27      BSA:          2.153 m Patient Age:    72 years       BP:           162/110 mmHg Patient Gender: M              HR:           81 bpm. Exam Location:  [facility] Procedure: Limited Echo Indications:    Dyspnea 786.09 / R06.00  History:        Patient has prior history of Echocardiogram examinations, most                 recent 05/31/2020. Hypertensive urgency, Acute respiratory failure                 with hypoxemia.  Sonographer:    Alvino Chapel RCS Referring Phys: [phone] Jolee Critcher IMPRESSIONS  1. Left ventricular ejection fraction, by estimation, is 45 to 50%. The left ventricle has mildly decreased function. The left ventricle demonstrates global hypokinesis. The left ventricular internal cavity size was mildly dilated. There is severe left ventricular hypertrophy. Left ventricular diastolic function could not be evaluated.  2. Right ventricular systolic function is normal. The right ventricular size is normal.  3. Left atrial size was moderately dilated.  4. The mitral valve is normal in structure. No evidence of mitral valve regurgitation. No evidence of mitral stenosis.  5. The aortic valve is normal in structure. Aortic valve regurgitation is not visualized. No aortic stenosis is present.  6. The inferior vena cava is normal in size with greater than 50% respiratory variability, suggesting right atrial pressure of 3 mmHg. FINDINGS  Left Ventricle: Left  ventricular ejection fraction, by estimation, is 45 to 50%. The left ventricle has mildly decreased function. The left ventricle demonstrates global hypokinesis. Definity contrast agent was given IV to delineate the left ventricular  endocardial borders. The left ventricular internal cavity size was mildly dilated. There is severe left ventricular hypertrophy. Right Ventricle: The right ventricular size is normal. No increase in right  ventricular wall thickness. Right ventricular systolic function is normal. Left Atrium: Left atrial size was moderately dilated. Right Atrium: Right atrial size was normal in size. Pericardium: There is no evidence of pericardial effusion. Mitral Valve: The mitral valve is normal in structure. Normal mobility of the mitral valve leaflets. No evidence of mitral valve stenosis. Tricuspid Valve: The tricuspid valve is normal in structure. Tricuspid valve regurgitation is not demonstrated. No evidence of tricuspid stenosis. Aortic Valve: The aortic valve is normal in structure. Aortic valve regurgitation is not visualized. No aortic stenosis is present. Pulmonic Valve: The pulmonic valve was normal in structure. Pulmonic valve regurgitation is not visualized. No evidence of pulmonic stenosis. Aorta: The aortic root is normal in size and structure. Venous: The inferior vena cava is normal in size with greater than 50% respiratory variability, suggesting right atrial pressure of 3 mmHg. IAS/Shunts: No atrial level shunt detected by color flow Doppler. LEFT VENTRICLE PLAX 2D LVIDd:         5.07 cm LVIDs:         3.85 cm LV PW:         2.00 cm LV IVS:        1.73 cm LVOT diam:     2.30 cm LVOT Area:     4.15 cm  LV Volumes (MOD) LV vol d, MOD A2C: 130.0 ml LV vol d, MOD A4C: 121.0 ml LV vol s, MOD A2C: 65.8 ml LV vol s, MOD A4C: 72.1 ml LV SV MOD A2C:     64.2 ml LV SV MOD A4C:     121.0 ml LV SV MOD BP:      59.8 ml LEFT ATRIUM         Index LA diam:    5.00 cm 2.32 cm/m   AORTA Ao Root  diam: 3.50 cm  SHUNTS Systemic Diam: 2.30 cm Jenkins Rouge MD Electronically signed by Jenkins Rouge MD Signature Date/Time: 06/01/2020/1:16:38 PM    Final     Micro Results   Recent Results (from the past 240 hour(s))  SARS Coronavirus 2 by RT PCR (hospital order, performed in [place] hospital lab) Nasopharyngeal Nasopharyngeal Swab     Status: None   Collection Time: 05/30/20 12:16 PM   Specimen: Nasopharyngeal Swab  Result Value Ref Range Status   SARS Coronavirus 2 NEGATIVE NEGATIVE Final    Comment: (NOTE) SARS-CoV-2 target nucleic acids are NOT DETECTED.  The SARS-CoV-2 RNA is generally detectable in upper and lower respiratory specimens during the acute phase of infection. The lowest concentration of SARS-CoV-2 viral copies this assay can detect is 250 copies / mL. A negative result does not preclude SARS-CoV-2 infection and should not be used as the sole basis for treatment or other patient management decisions.  A negative result may occur with improper specimen collection / handling, submission of specimen other than nasopharyngeal swab, presence of viral mutation(s) within the areas targeted by this assay, and inadequate number of viral copies (<250 copies / mL). A negative result must be combined with clinical observations, patient history, and epidemiological information.  Fact Sheet for Patients:   StrictlyIdeas.no  Fact Sheet for Healthcare Providers: BankingDealers.co.za  This test is not yet approved or  cleared by the [place] FDA and has been authorized for detection and/or diagnosis of SARS-CoV-2 by FDA under an Emergency Use Authorization (EUA).  This EUA will remain in effect (meaning this test can be used) for the duration of the COVID-19 declaration under Section 564(b)(1) of  the Act, 21 U.S.C. section 360bbb-3(b)(1), unless the authorization is terminated or revoked sooner.  Performed at [facility], [address] [place], [place] [address]   SARS Coronavirus 2 by RT PCR (hospital order, performed in [facility] hospital lab) Nasopharyngeal Nasopharyngeal Swab     Status: None   Collection Time: 05/31/20  2:45 PM   Specimen: Nasopharyngeal Swab  Result Value Ref Range Status   SARS Coronavirus 2 NEGATIVE NEGATIVE Final    Comment: (NOTE) SARS-CoV-2 target nucleic acids are NOT DETECTED.  The SARS-CoV-2 RNA is generally detectable in upper and lower respiratory specimens during the acute phase of infection. The lowest concentration of SARS-CoV-2 viral copies this assay can detect is 250 copies / mL. A negative result does not preclude SARS-CoV-2 infection and should not be used as the sole basis for treatment or other patient management decisions.  A negative result may occur with improper specimen collection / handling, submission of specimen other than nasopharyngeal swab, presence of viral mutation(s) within the areas targeted by this assay, and inadequate number of viral copies (<250 copies / mL). A negative result must be combined with clinical observations, patient history, and epidemiological information.  Fact Sheet for Patients:   StrictlyIdeas.no  Fact Sheet for Healthcare Providers: BankingDealers.co.za  This test is not yet approved or  cleared by the [place] FDA and has been authorized for detection and/or diagnosis of SARS-CoV-2 by FDA under an Emergency Use Authorization (EUA).  This EUA will remain in effect (meaning this test can be used) for the duration of the COVID-19 declaration under Section 564(b)(1) of the Act, 21 U.S.C. section 360bbb-3(b)(1), unless the authorization is terminated or revoked sooner.  Performed at [facility], [address] [place], [place] [address]    Today   Subjective    Ricardo Evans today has no new concerns, --BP control much improved, -No headaches, no chest  pain, hypoxia resolved   Patient has been seen and examined prior to discharge   Objective   Blood pressure 123/82, pulse 74, temperature 97.9 F (36.6 C), temperature source Oral, resp. rate 16, height 6' (1.829 m), weight 93 kg, SpO2 96 %.   Intake/Output Summary (Last 24 hours) at 06/02/2020 1838 Last data filed at 06/02/2020 1300 Gross per 24 hour  Intake 240 ml  Output --  Net 240 ml    Exam Gen:- Awake Alert, no acute distress  HEENT:- .AT, No sclera icterus Neck-Supple Neck,No JVD,.  Lungs-  CTAB , good air movement bilaterally  CV- S1, S2 normal, regular Abd-  +ve B.Sounds, Abd Soft, No tenderness,    Extremity/Skin:- No  edema,   good pulses Psych-affect is appropriate, oriented x3 Neuro-no new focal deficits, no tremors    Data Review   CBC w Diff:  Lab Results  Component Value Date   WBC 7.0 05/30/2020   HGB 14.2 05/30/2020   HCT 42.4 05/30/2020   PLT 171 05/30/2020   LYMPHOPCT 13 05/30/2020   MONOPCT 9 05/30/2020   EOSPCT 0 05/30/2020   BASOPCT 0 05/30/2020    CMP:  Lab Results  Component Value Date   NA 136 05/30/2020   K 3.1 (L) 05/30/2020   CL 98 05/30/2020   CO2 25 05/30/2020   BUN 12 05/30/2020   CREATININE 1.23 05/30/2020   PROT 8.2 (H) 05/30/2020   ALBUMIN 4.4 05/30/2020   BILITOT 1.5 (H) 05/30/2020   ALKPHOS 85 05/30/2020   AST 24 05/30/2020   ALT 23 05/30/2020  .  Total Discharge time is about 33 minutes  Roxan Hockey M.D on 06/02/2020 at 6:38 PM  Go to [email or web address] -  for contact info  [facility] - Office  [phone]

## 2020-06-02 NOTE — Progress Notes (Signed)
IV removed and DC instructions reviewed.  Scripts sent to pharmacy.  Girlfriend present and to drive home.  Transported by WC to front door.

## 2020-06-02 NOTE — Discharge Instructions (Addendum)
Cardiology Hospital Follow-up with Dr. Antoine Poche on 06/18/2020 at 10:40 AM at the [facility].   1)Very low-salt diet advised 2) please check your blood pressure 2-3 times a week and please keep a diary and take the diary/log with you when you go to see a cardiologist 3) please follow-up with cardiologist as advised for further management of your elevated blood pressure and concerns about enlargement of the heart from elevated blood pressure [facility] Antoine Poche on 06/18/2020 at 10:40 AM at the [facility].

## 2020-06-04 LAB — QUANTIFERON-TB GOLD PLUS (RQFGPL)
QuantiFERON Mitogen Value: >10 IU/mL
QuantiFERON Nil Value: 0.02 IU/mL
QuantiFERON TB1 Ag Value: 0.02 IU/mL
QuantiFERON TB2 Ag Value: 0.02 IU/mL

## 2020-06-04 LAB — QUANTIFERON-TB GOLD PLUS: QuantiFERON-TB Gold Plus: NEGATIVE

## 2020-06-17 ENCOUNTER — Encounter: Payer: Self-pay | Admitting: *Deleted

## 2020-06-17 NOTE — Progress Notes (Signed)
Cardiology Office Note   Date:  06/18/2020   ID:  Ricardo Evans, DOB 1980/12/30, MRN [id number]  PCP:  [name], MD  Cardiologist:   Rollene Rotunda, MD Referring:  [name], MD  No chief complaint on file.     History of Present Illness: Ricardo Evans is a 39 y.o. male who is referred from the hospital with HTN and acute systolic and diastolic heart failure.  He was in the hospital earlier this month.  I reviewed these records for this visit.  He presented with SOB and hypertensive urgency.  Echo EF was 45 - 50% with severe left ventricular hypertrophy.  He was sent home in increased dose of Coreg and amlodipine.  Of note there was no evidence of RAS on CT.  He was not seen by cardiology during that visit.     The patient reports that he has been hypertensive for a couple of years.  He really was not paying much attention to it given his young age before that.  He is otherwise felt well.  He presented that day because he had a headache.  He was short of breath.  He has been managed with blood pressure medications with changes as above.  He is keeping a blood pressure diary and still has systolics typically in the 150s to 170s.  Diastolics are elevated in the 90s to 110's.  The blood pressure is different by about 10 mmHg in both arms.  In the hospital he was noted to have a BNP of 265.  Potassium initially was low at 3.1.  He did have some evidence of vascular prominence suggestive of volume overload on his chest x-ray.  He was treated with IV labetalol.  An MRA with no evidence of renal artery stenosis.  CTA of the chest demonstrated possible bronchitis.  There was mild mediastinal and bilateral hilar lymphadenopathy.  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  There was a mention of aortic atherosclerosis.  MRA demonstrated widely patent renal arteries.  There were 2 on the right and a single on the left.   Past Medical History:  Diagnosis Date  . Hypertension     . Hypertensive heart disease   . Systolic HF (heart failure) [facility]: [facility])     Past Surgical History:  Procedure Laterality Date  . SPIGELIAN HERNIA    . TONSILLECTOMY AND ADENOIDECTOMY       Current Outpatient Medications  Medication Sig Dispense Refill  . acetaminophen (TYLENOL) 325 MG tablet Take 2 tablets (650 mg total) by mouth every 6 (six) hours as needed for mild pain (or Fever >/= 101). 12 tablet 0  . amLODipine (NORVASC) 10 MG tablet Take 1 tablet (10 mg total) by mouth daily. For BP 30 tablet 3  . carvedilol (COREG) 12.5 MG tablet Take 1 tablet (12.5 mg total) by mouth 2 (two) times daily with a meal. For BP 60 tablet 3  . hydrALAZINE (APRESOLINE) 50 MG tablet Take 1 tablet (50 mg total) by mouth 3 (three) times daily. For BP 90 tablet 3  . hydrOXYzine (ATARAX/VISTARIL) 25 MG tablet Take 1 tablet (25 mg total) by mouth at bedtime as needed. 30 tablet 1  . losartan (COZAAR) 50 MG tablet Take 1 tablet (50 mg total) by mouth daily. For BP 30 tablet 3  . spironolactone (ALDACTONE) 50 MG tablet Take 1 tablet (50 mg total) by mouth daily. 90 tablet 1   No current facility-administered medications for this visit.  Allergies:   Cephalexin, Penicillins, and Sulfonamide derivatives    Social History:  The patient  reports that he has never smoked. He has never used smokeless tobacco. He reports current alcohol use. He reports that he does not use drugs.   Family History:  The patient's family history includes COPD in his father; Heart disease in his maternal grandmother; Suicidality in his mother.   Of note he did call his uncle and found out that there is a family history of the maternal side of congestive heart failure but he does not know a lot of details.   ROS:  Please see the history of present illness.   Otherwise, review of systems are positive for fatigue.   All other systems are reviewed and negative.    PHYSICAL EXAM: VS:  BP (!) 190/130 (BP Location: Right Arm, Cuff Size:  Normal)   Pulse 80   Ht 6' (1.829 m)   Wt 210 lb 3.2 oz (95.3 kg)   SpO2 98%   BMI 28.51 kg/m  , BMI Body mass index is 28.51 kg/m. GENERAL:  Well appearing HEENT:  Pupils equal round and reactive, fundi not visualized, oral mucosa unremarkable NECK:  No jugular venous distention, waveform within normal limits, carotid upstroke brisk and symmetric, no bruits, no thyromegaly LYMPHATICS:  No cervical, inguinal adenopathy LUNGS:  Clear to auscultation bilaterally BACK:  No CVA tenderness CHEST:  Unremarkable HEART:  PMI not displaced or sustained,S1 and S2 within normal limits, no S3, no S4, no clicks, no rubs, no murmurs ABD:  Flat, positive bowel sounds normal in frequency in pitch, no bruits, no rebound, no guarding, no midline pulsatile mass, no hepatomegaly, no splenomegaly EXT:  2 plus pulses throughout, no edema, no cyanosis no clubbing SKIN:  No rashes no nodules NEURO:  Cranial nerves II through XII grossly intact, motor grossly intact throughout PSYCH:  Cognitively intact, oriented to person place and time    EKG:  EKG is not ordered today. The ekg ordered 05/30/2020 demonstrates sinus rhythm, rate 86, left ventricular hypertrophy with repolarization abnormalities.   Recent Labs: 05/30/2020: ALT 23; B Natriuretic Peptide 265.0; BUN 12; Creatinine, Ser 1.23; Hemoglobin 14.2; Magnesium 1.7; Platelets 171; Potassium 3.1; Sodium 136    Lipid Panel No results found for: CHOL, TRIG, HDL, CHOLHDL, VLDL, LDLCALC, LDLDIRECT    Wt Readings from Last 3 Encounters:  06/18/20 210 lb 3.2 oz (95.3 kg)  05/30/20 205 lb (93 kg)      Other studies Reviewed: Additional studies/ records that were reviewed today include: Echo, MRI, hospital records, CT, EKG, labs. Review of the above records demonstrates:  Please see elsewhere in the note.     ASSESSMENT AND PLAN:  ACUTE SYSTOLIC HF:    Patient had a reduced ejection fraction.  He is going to need an extensive work-up for his  significant hypertension which clearly has a secondary etiology.  For now we will work on controlling his blood pressure while completing this work-up.  He seems to be euvolemic.  He understands salt and fluid restriction.  He needs to be off of work and I be happy to help him facilitate paperwork as needed.  He will likely need cardiac MRI to further evaluate.  HTN: The blood pressure needs to be worked up as above.  I am going to check 24-hour urine for VMA and metanephrines.  We will check a UA.  I will check a cortisol level.  He does not appear to have renal artery stenosis.  He is going to continue the meds as listed and I will add spironolactone 50 mg daily.  I will be following closely with basic metabolic profile.  DILATED AORTIC ROOT: Eventually I will plan CT angiogram of his chest to follow-up on this and look for any other structural abnormalities.  Of note he did not have a significant differential in his blood pressure in the right left arm.  ABNORMAL CT: The patient had evidence of lymphadenopathy and the question of sarcoid was raised.  After further work-up I will consider referring him to pulmonary as well.  COVID EDUCATION:  He has been vaccinated.     Current medicines are reviewed at length with the patient today.  The patient does not have concerns regarding medicines.  The following changes have been made:  As above  Labs/ tests ordered today include:   Orders Placed This Encounter  Procedures  . Urinalysis  . Cortisol  . Catecholamine+VMA, 24-Hr Urine  . Basic Metabolic Panel (BMET)     Disposition:   FU with me in two weeks.      Signed, Rollene Rotunda, MD  06/18/2020 1:48 PM    [facility]

## 2020-06-18 ENCOUNTER — Ambulatory Visit (INDEPENDENT_AMBULATORY_CARE_PROVIDER_SITE_OTHER): Payer: Self-pay | Admitting: Cardiology

## 2020-06-18 ENCOUNTER — Encounter: Payer: Self-pay | Admitting: Cardiology

## 2020-06-18 ENCOUNTER — Other Ambulatory Visit (HOSPITAL_COMMUNITY)
Admission: RE | Admit: 2020-06-18 | Discharge: 2020-06-18 | Disposition: A | Discharge status: Home / Self Care | Payer: Self-pay | Source: Ambulatory Visit | Attending: Cardiology | Admitting: Cardiology

## 2020-06-18 VITALS — BP 190/130 | HR 80 | Ht 72.0 in | Wt 210.2 lb

## 2020-06-18 DIAGNOSIS — I429 Cardiomyopathy, unspecified: Secondary | ICD-10-CM | POA: Insufficient documentation

## 2020-06-18 DIAGNOSIS — I160 Hypertensive urgency: Secondary | ICD-10-CM

## 2020-06-18 DIAGNOSIS — I5041 Acute combined systolic (congestive) and diastolic (congestive) heart failure: Secondary | ICD-10-CM | POA: Insufficient documentation

## 2020-06-18 DIAGNOSIS — I77810 Thoracic aortic ectasia: Secondary | ICD-10-CM | POA: Insufficient documentation

## 2020-06-18 DIAGNOSIS — R825 Elevated urine levels of drugs, medicaments and biological substances: Secondary | ICD-10-CM | POA: Insufficient documentation

## 2020-06-18 LAB — URINALYSIS, ROUTINE W REFLEX MICROSCOPIC
Bilirubin Urine: NEGATIVE
Glucose, UA: NEGATIVE mg/dL
Hgb urine dipstick: NEGATIVE
Ketones, ur: NEGATIVE mg/dL
Leukocytes,Ua: NEGATIVE
Nitrite: NEGATIVE
Protein, ur: NEGATIVE mg/dL
Specific Gravity, Urine: 1.014 (ref 1.005–1.030)
pH: 5 (ref 5.0–8.0)

## 2020-06-18 LAB — CORTISOL: Cortisol, Plasma: 6.7 ug/dL

## 2020-06-18 MED ORDER — SPIRONOLACTONE 50 MG PO TABS: 50.0000 mg | ORAL_TABLET | Freq: Every day | ORAL | 1 refills | Status: DC

## 2020-06-18 NOTE — Patient Instructions (Signed)
Your physician recommends that you schedule a follow-up appointment in: 2 WEEKS WITH DR [facility]   Your physician has recommended you make the following change in your medication:   START SPIRONOLACTONE 50 MG DAILY   Your physician recommends that you return for lab work NOW (UA) AND IN 2 WEEKS (BMP)  Thank you for choosing [facility]!!

## 2020-06-22 ENCOUNTER — Other Ambulatory Visit: Payer: Self-pay | Admitting: *Deleted

## 2020-06-22 DIAGNOSIS — I160 Hypertensive urgency: Secondary | ICD-10-CM

## 2020-06-23 ENCOUNTER — Telehealth: Payer: Self-pay | Admitting: Cardiology

## 2020-06-23 NOTE — Telephone Encounter (Signed)
Error

## 2020-06-29 ENCOUNTER — Other Ambulatory Visit (HOSPITAL_COMMUNITY)
Admission: RE | Admit: 2020-06-29 | Discharge: 2020-06-29 | Disposition: A | Discharge status: Home / Self Care | Payer: 59 | Source: Ambulatory Visit | Attending: Cardiology | Admitting: Cardiology

## 2020-06-29 DIAGNOSIS — R918 Other nonspecific abnormal finding of lung field: Secondary | ICD-10-CM | POA: Insufficient documentation

## 2020-06-29 DIAGNOSIS — I10 Essential (primary) hypertension: Secondary | ICD-10-CM | POA: Insufficient documentation

## 2020-06-29 DIAGNOSIS — I160 Hypertensive urgency: Secondary | ICD-10-CM | POA: Diagnosis present

## 2020-06-29 LAB — BASIC METABOLIC PANEL
Anion gap: 10 (ref 5–15)
BUN: 23 mg/dL — ABNORMAL HIGH (ref 6–20)
CO2: 25 mmol/L (ref 22–32)
Calcium: 9.4 mg/dL (ref 8.9–10.3)
Chloride: 105 mmol/L (ref 98–111)
Creatinine, Ser: 1.33 mg/dL — ABNORMAL HIGH (ref 0.61–1.24)
GFR calc Af Amer: >60 mL/min (ref >60)
GFR calc non Af Amer: >60 mL/min (ref >60)
Glucose, Bld: 99 mg/dL (ref 70–99)
Potassium: 4.3 mmol/L (ref 3.5–5.1)
Sodium: 140 mmol/L (ref 135–145)

## 2020-06-29 NOTE — Progress Notes (Signed)
Cardiology Office Note   Date:  06/30/2020   ID:  Ricardo Evans, DOB [birth year], MRN [id number]  PCP:  [name], MD  Cardiologist:   Rollene Rotunda, MD Referring:  [name], MD  Chief Complaint  Patient presents with  . Cardiomyopathy      History of Present Illness: Ricardo Evans is a 39 y.o. male who is referred from the hospital with HTN and acute systolic and diastolic heart failure.  He was in the hospital earlier this month.   He presented with SOB and hypertensive urgency.  Echo EF was 45 - 50% with severe left ventricular hypertrophy.  He was sent home in increased dose of Coreg and amlodipine.  Of note there was no evidence of RAS on CT.  He was not seen by cardiology during that visit.   In the hospital he was noted to have a BNP of 265.  Potassium initially was low at 3.1.  He did have some evidence of vascular prominence suggestive of volume overload on his chest x-ray.  He was treated with IV labetalol.  An MRA with no evidence of renal artery stenosis.  CTA of the chest demonstrated possible bronchitis.  There was mild mediastinal and bilateral hilar lymphadenopathy.  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  There was a mention of aortic atherosclerosis.  MRA demonstrated widely patent renal arteries.  There were 2 on the right and a single on the left.  At the first visit with me in the hospital I checked a UA and cortisol level which were unremarkable.   VMA and metanephrines are in progress.  At the last visit I added spironolactone.  He feels OK.  He has had none of the shortness of breath that he had on presentation.  He is tolerating the spironolactone.  Blood pressures are elevated but they do come down during the day although they might go back up in the afternoon.  He is not having any severe spikes.  Of note his electrolytes were okay and came back yesterday with normal potassium.  His creatinine was very mildly elevated.  VMA and metanephrines  were negative.   Past Medical History:  Diagnosis Date  . Hypertension   . Hypertensive heart disease   . Systolic HF (heart failure) [facility])     Past Surgical History:  Procedure Laterality Date  . SPIGELIAN HERNIA    . TONSILLECTOMY AND ADENOIDECTOMY       Current Outpatient Medications  Medication Sig Dispense Refill  . acetaminophen (TYLENOL) 325 MG tablet Take 2 tablets (650 mg total) by mouth every 6 (six) hours as needed for mild pain (or Fever >/= 101). 12 tablet 0  . carvedilol (COREG) 12.5 MG tablet Take 1 tablet (12.5 mg total) by mouth 2 (two) times daily with a meal. For BP 60 tablet 3  . hydrALAZINE (APRESOLINE) 50 MG tablet Take 1 tablet (50 mg total) by mouth 3 (three) times daily. For BP 90 tablet 3  . spironolactone (ALDACTONE) 50 MG tablet Take 1 tablet (50 mg total) by mouth daily. 90 tablet 1  . hydrOXYzine (ATARAX/VISTARIL) 25 MG tablet Take 1 tablet (25 mg total) by mouth at bedtime as needed. (Patient not taking: Reported on 06/30/2020) 30 tablet 1  . Olmesartan-amLODIPine-HCTZ 40-10-25 MG TABS Take 1 tablet by mouth daily. 90 tablet 3   No current facility-administered medications for this visit.    Allergies:   Cephalexin, Penicillins, and Sulfonamide derivatives  Social History:  The patient   ROS:  Please see the history of present illness.   Otherwise, review of systems are positive for fatigue.   All other systems are reviewed and negative.    PHYSICAL EXAM: VS:  BP (!) 160/118   Pulse 76   Ht 6' (1.829 m)   Wt 211 lb (95.7 kg)   BMI 28.62 kg/m  , BMI Body mass index is 28.62 kg/m. GENERAL:  Well appearing NECK:  No jugular venous distention, waveform within normal limits, carotid upstroke brisk and symmetric, no bruits, no thyromegaly LUNGS:  Clear to auscultation bilaterally CHEST:  Unremarkable HEART:  PMI not displaced or sustained,S1 and S2 within normal limits, no S3, no S4, no clicks, no rubs, no murmurs ABD:  Flat, positive bowel  sounds normal in frequency in pitch, no bruits, no rebound, no guarding, no midline pulsatile mass, no hepatomegaly, no splenomegaly EXT:  2 plus pulses throughout, no edema, no cyanosis no clubbing   EKG:  EKG is not  ordered today.    Recent Labs: 05/30/2020: ALT 23; B Natriuretic Peptide 265.0; Hemoglobin 14.2; Magnesium 1.7; Platelets 171 06/29/2020: BUN 23; Creatinine, Ser 1.33; Potassium 4.3; Sodium 140    Lipid Panel No results found for: CHOL, TRIG, HDL, CHOLHDL, VLDL, LDLCALC, LDLDIRECT    Wt Readings from Last 3 Encounters:  06/30/20 211 lb (95.7 kg)  06/18/20 210 lb 3.2 oz (95.3 kg)  05/30/20 205 lb (93 kg)      Other studies Reviewed: Additional studies/ records that were reviewed today include: Labs Review of the above records demonstrates:  Please see elsewhere in the note.     ASSESSMENT AND PLAN:  ACUTE SYSTOLIC HF:    I have reviewed his echo.  It does not seem to be a secondary cause and at this point we will hold off on MRI and follow his ejection fraction and repeat echo after I further controlled his blood pressure.   HTN: There is no clear secondary etiology.  I am going to continue to titrate his meds.  I Ernie Hew stop his Norvasc and his Cozaar and start Tribenzor 40/10/25.  He will get a basic metabolic profile in 3 weeks.  He will keep a blood pressure diary and I will continue to titrate.  DILATED AORTIC ROOT:     I will eventually follow-up with another CT.  ABNORMAL CT:   This can be followed up with the CT as above.  At this point I will defer pulmonary follow-up pending that CT which I will do in several months.   COVID EDUCATION:    He has been vaccinated.     Current medicines are reviewed at length with the patient today.  The patient does not have concerns regarding medicines.  The following changes have been made:  As above  Labs/ tests ordered today include:   No orders of the defined types were placed in this  encounter.    Disposition:   FU with me in 6 weeks.      Signed, Rollene Rotunda, MD  06/30/2020 9:40 AM    [facility]

## 2020-06-30 ENCOUNTER — Encounter: Payer: Self-pay | Admitting: Cardiology

## 2020-06-30 ENCOUNTER — Other Ambulatory Visit: Payer: Self-pay

## 2020-06-30 ENCOUNTER — Ambulatory Visit (INDEPENDENT_AMBULATORY_CARE_PROVIDER_SITE_OTHER): Payer: 59 | Admitting: Cardiology

## 2020-06-30 VITALS — BP 160/118 | HR 76 | Ht 72.0 in | Wt 211.0 lb

## 2020-06-30 DIAGNOSIS — I77810 Thoracic aortic ectasia: Secondary | ICD-10-CM | POA: Diagnosis not present

## 2020-06-30 DIAGNOSIS — I5041 Acute combined systolic (congestive) and diastolic (congestive) heart failure: Secondary | ICD-10-CM

## 2020-06-30 DIAGNOSIS — I10 Essential (primary) hypertension: Secondary | ICD-10-CM | POA: Diagnosis not present

## 2020-06-30 DIAGNOSIS — R918 Other nonspecific abnormal finding of lung field: Secondary | ICD-10-CM | POA: Diagnosis not present

## 2020-06-30 LAB — CATECHOLAMINE+VMA, 24-HR URINE
Dopamine, Rand Ur: 158 ug/L
Dopamine, Ur, 24Hr: 237 ug/24 hr (ref 0–510)
Epinephrine, Rand Ur: 2 ug/L
Epinephrine, U, 24Hr: 3 ug/24 hr (ref 0–20)
Norepinephrine, Rand Ur: 33 ug/L
Norepinephrine,U,24H: 50 ug/24 hr (ref 0–135)
Total Volume: 1500
VMA, 24H Ur Adult: 5 mg/24 hr (ref 0.0–7.5)
VMA, Urine: 3.3 mg/L

## 2020-06-30 MED ORDER — OLMESARTAN-AMLODIPINE-HCTZ 40-10-25 MG PO TABS: 1.0000 | ORAL_TABLET | Freq: Every day | ORAL | 3 refills | Status: DC

## 2020-06-30 NOTE — Patient Instructions (Signed)
Medication Instructions:  Please discontinue your Norvasc and Cozaar.  Start Tribenzor 40/10/25 mg (1) tablet daily. Continue all other medications as listed.  *If you need a refill on your cardiac medications before your next appointment, please call your pharmacy*  Lab Work: Please have blood work in 3 weeks (BMP) with results faxed to Dr. Antoine Poche [phone].  If you have labs (blood work) drawn today and your tests are completely normal, you will receive your results only by: Marland Kitchen MyChart Message (if you have MyChart) OR . A paper copy in the mail If you have any lab test that is abnormal or we need to change your treatment, we will call you to review the results.  Follow-Up: At [facility], you and your health needs are our priority.  As part of our continuing mission to provide you with exceptional heart care, we have created designated Provider Care Teams.  These Care Teams include your primary Cardiologist (physician) and Advanced Practice Providers (APPs -  Physician Assistants and Nurse Practitioners) who all work together to provide you with the care you need, when you need it.  We recommend signing up for the patient portal called "MyChart".  Sign up information is provided on this After Visit Summary.  MyChart is used to connect with patients for Virtual Visits (Telemedicine).  Patients are able to view lab/test results, encounter notes, upcoming appointments, etc.  Non-urgent messages can be sent to your provider as well.   To learn more about what you can do with MyChart, go to [email or web address].    Your next appointment:   6 week(s)  The format for your next appointment:   In Person  Provider:   Rollene Rotunda, MD  Thank you for choosing [facility]!!

## 2020-07-16 ENCOUNTER — Telehealth: Payer: Self-pay | Admitting: Cardiology

## 2020-07-16 NOTE — Telephone Encounter (Signed)
Spoke with patient who reports he had labs 07/13/20 He was told something was wrong with his kidneys Explained that we do not have access to [facility]'s labs He asked that I call - [phone]  Explained MD is not in the office until 07/19/20  Called [facility] and they will fax labs for MD to review

## 2020-07-16 NOTE — Telephone Encounter (Signed)
New message  Pt called in and would like to see if he could speak with nurse about some labs that he had done last week at [facility].  He stated that they mentioned something going on with his lever.    Best number [phone]

## 2020-07-19 ENCOUNTER — Telehealth: Payer: Self-pay | Admitting: Cardiology

## 2020-07-19 ENCOUNTER — Telehealth: Payer: Self-pay | Admitting: *Deleted

## 2020-07-19 DIAGNOSIS — Z79899 Other long term (current) drug therapy: Secondary | ICD-10-CM

## 2020-07-19 NOTE — Telephone Encounter (Signed)
Left message to call back  

## 2020-07-19 NOTE — Telephone Encounter (Signed)
Ricardo Rotunda, MD  07/18/2020 2:26 PM EDT     Creat is elevated. He has renal insufficiency secondary to his severe hypertension. I would like to repeat a BMET in 10 days to see if his creat remains elevated and is it stable. If so we can just follow this. No change in therapy at this time. Call [name] with the results and send results to [name], MD   Pt states that he will get this done on 07-26-2020. He has an appointment with his PCP that day and wants to get it done then.[facility] [phone]. Lab fax [phone], faxed  BMET order entered  Forwarded to PCP via [facility] fax function

## 2020-07-19 NOTE — Telephone Encounter (Signed)
Patient is calling back to view his results. Please call back

## 2020-07-19 NOTE — Telephone Encounter (Signed)
-----   Message from Rollene Rotunda, MD sent at 07/18/2020  2:26 PM EDT ----- Creat is elevated.  He has renal insufficiency secondary to his severe hypertension.  I would like to repeat a BMET in 10 days to see if his creat remains elevated and is it stable.  If so we can just follow this.  No change in therapy at this time.  Call Mr. Ricardo Evans with the results and send results to [name], MD

## 2020-07-30 NOTE — Telephone Encounter (Signed)
See other phone note 9/27

## 2020-07-30 NOTE — Telephone Encounter (Signed)
Requested labs from PCP 

## 2020-08-08 NOTE — Progress Notes (Signed)
Cardiology Office Note   Date:  08/11/2020   ID:  Ricardo Evans, DOB 1981/04/18, MRN [id number]  PCP:  [name], MD  Cardiologist:   Rollene Rotunda, MD   Chief Complaint  Patient presents with  . Hypertension     History of Present Illness: Ricardo Evans is a 39 y.o. male who is referred from the hospital with HTN and acute systolic and diastolic heart failure.  He was in the hospital in August.   He presented with SOB and hypertensive urgency.  Echo EF was 45 - 50% with severe left ventricular hypertrophy.  He was sent home in increased dose of Coreg and amlodipine.  Of note there was no evidence of RAS on CT.  He was not seen by cardiology during that visit.   In the hospital he was noted to have a BNP of 265.  Potassium initially was low at 3.1.  He did have some evidence of vascular prominence suggestive of volume overload on his chest x-ray.  He was treated with IV labetalol.  An MRA with no evidence of renal artery stenosis.  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  There was a mention of aortic atherosclerosis.  MRA demonstrated widely patent renal arteries.  There were 2 on the right and a single on the left.  At the last visit I switched him to Tribenzor stopping his Norvasc and Cozaar.  Unfortunately he started the Tribenzor and stopped his Coreg instead of his Cozaar.  His blood pressure has been well controlled.  Is been back to work.  He feels better.  He is not having any headache or shortness of breath that he was having.  I did also start him on spironolactone and follow this closely with a couple of basic metabolic profiles.  His creatinine did go up to 1.68 when checked on 21 September.  He denies any chest pressure, neck or arm discomfort but he is in no new PND or orthopnea.  He feels much better than he did previously.  Past Medical History:  Diagnosis Date  . Hypertension   . Hypertensive heart disease   . Systolic HF (heart failure) [facility])      Past Surgical History:  Procedure Laterality Date  . SPIGELIAN HERNIA    . TONSILLECTOMY AND ADENOIDECTOMY       Current Outpatient Medications  Medication Sig Dispense Refill  . acetaminophen (TYLENOL) 325 MG tablet Take 2 tablets (650 mg total) by mouth every 6 (six) hours as needed for mild pain (or Fever >/= 101). 12 tablet 0  . hydrALAZINE (APRESOLINE) 50 MG tablet Take 1 tablet (50 mg total) by mouth 3 (three) times daily. For BP 90 tablet 3  . Olmesartan-amLODIPine-HCTZ 40-10-25 MG TABS Take 1 tablet by mouth daily. 90 tablet 3  . spironolactone (ALDACTONE) 50 MG tablet Take 1 tablet (50 mg total) by mouth daily. 90 tablet 1  . carvedilol (COREG) 12.5 MG tablet Take 1 tablet (12.5 mg total) by mouth 2 (two) times daily with a meal. For BP 180 tablet 3  . hydrOXYzine (ATARAX/VISTARIL) 25 MG tablet Take 1 tablet (25 mg total) by mouth at bedtime as needed. (Patient not taking: Reported on 06/30/2020) 30 tablet 1   No current facility-administered medications for this visit.    Allergies:   Cephalexin, Penicillins, and Sulfonamide derivatives    Social History:  The patient   ROS:  Please see the history of present illness.   Otherwise,  review of systems are positive for none.   All other systems are reviewed and negative.    PHYSICAL EXAM: VS:  BP (!) 130/98   Pulse 96   Ht 6' (1.829 m)   Wt 220 lb (99.8 kg)   BMI 29.84 kg/m  , BMI Body mass index is 29.84 kg/m. GENERAL:  Well appearing NECK:  No jugular venous distention, waveform within normal limits, carotid upstroke brisk and symmetric, no bruits, no thyromegaly LUNGS:  Clear to auscultation bilaterally CHEST:  Unremarkable HEART:  PMI not displaced or sustained,S1 and S2 within normal limits, no S3, positive S4, no clicks, no rubs, no murmurs ABD:  Flat, positive bowel sounds normal in frequency in pitch, no bruits, no rebound, no guarding, no midline pulsatile mass, no hepatomegaly, no splenomegaly EXT:  2 plus  pulses throughout, no edema, no cyanosis no clubbing   EKG:  EKG is not ordered today.    Recent Labs: 05/30/2020: ALT 23; B Natriuretic Peptide 265.0; Hemoglobin 14.2; Magnesium 1.7; Platelets 171 06/29/2020: BUN 23; Creatinine, Ser 1.33; Potassium 4.3; Sodium 140    Lipid Panel No results found for: CHOL, TRIG, HDL, CHOLHDL, VLDL, LDLCALC, LDLDIRECT    Wt Readings from Last 3 Encounters:  08/11/20 220 lb (99.8 kg)  06/30/20 211 lb (95.7 kg)  06/18/20 210 lb 3.2 oz (95.3 kg)      Other studies Reviewed: Additional studies/ records that were reviewed today include:  Labs Review of the above records demonstrates:  Please see elsewhere in the note.     ASSESSMENT AND PLAN:  ACUTE SYSTOLIC HF:   I clarified that I want him to be on carvedilol and to stop the Cozaar and continue the other medications.  He seems to be euvolemic.  No change in therapy.   I will follow up with an echocardiogram probably after the next visit.  HTN:   His blood pressure is better controlled.  However, he needs clarification on his meds as above.  I am also going to check a basic metabolic profile again watching closely his creatinine and potassium.  Hopefully his creatinine will come down as he gets rid of the redundant ARB.  DILATED AORTIC ROOT:   He will need follow up next year.    ABNORMAL CT:   He does have an abnormal CT and I will be following this up when I follow-up his aortic root.  I will see him back before scheduling that.  COVID EDUCATION:    He has been vaccinated.     Current medicines are reviewed at length with the patient today.  The patient does not have concerns regarding medicines.  The following changes have been made:  As above  Labs/ tests ordered today include:   Orders Placed This Encounter  Procedures  . Basic metabolic panel     Disposition:   FU with me in 4 months.   Signed, Rollene Rotunda, MD  08/11/2020 11:02 AM    [facility]

## 2020-08-11 ENCOUNTER — Encounter: Payer: Self-pay | Admitting: Cardiology

## 2020-08-11 ENCOUNTER — Ambulatory Visit (INDEPENDENT_AMBULATORY_CARE_PROVIDER_SITE_OTHER): Payer: 59 | Admitting: Cardiology

## 2020-08-11 ENCOUNTER — Other Ambulatory Visit: Payer: Self-pay

## 2020-08-11 VITALS — BP 130/98 | HR 96 | Ht 72.0 in | Wt 220.0 lb

## 2020-08-11 DIAGNOSIS — I10 Essential (primary) hypertension: Secondary | ICD-10-CM

## 2020-08-11 DIAGNOSIS — I5021 Acute systolic (congestive) heart failure: Secondary | ICD-10-CM

## 2020-08-11 DIAGNOSIS — Z79899 Other long term (current) drug therapy: Secondary | ICD-10-CM

## 2020-08-11 DIAGNOSIS — I77810 Thoracic aortic ectasia: Secondary | ICD-10-CM | POA: Diagnosis not present

## 2020-08-11 DIAGNOSIS — R918 Other nonspecific abnormal finding of lung field: Secondary | ICD-10-CM

## 2020-08-11 MED ORDER — CARVEDILOL 12.5 MG PO TABS
12.5000 mg | ORAL_TABLET | Freq: Two times a day (BID) | ORAL | 3 refills | Status: DC
Start: 2020-08-11 — End: 2021-09-20

## 2020-08-11 NOTE — Patient Instructions (Signed)
Medication Instructions:  The current medical regimen is effective;  continue present plan and medications.  *If you need a refill on your cardiac medications before your next appointment, please call your pharmacy*  Lab Work: Please have lab work at your primary care Dr's office. (BMP - DX:  I10, [phone]) [phone] fax #  If you have labs (blood work) drawn today and your tests are completely normal, you will receive your results only by: Marland Kitchen MyChart Message (if you have MyChart) OR . A paper copy in the mail If you have any lab test that is abnormal or we need to change your treatment, we will call you to review the results.  Follow-Up: At [facility], you and your health needs are our priority.  As part of our continuing mission to provide you with exceptional heart care, we have created designated Provider Care Teams.  These Care Teams include your primary Cardiologist (physician) and Advanced Practice Providers (APPs -  Physician Assistants and Nurse Practitioners) who all work together to provide you with the care you need, when you need it.  We recommend signing up for the patient portal called "MyChart".  Sign up information is provided on this After Visit Summary.  MyChart is used to connect with patients for Virtual Visits (Telemedicine).  Patients are able to view lab/test results, encounter notes, upcoming appointments, etc.  Non-urgent messages can be sent to your provider as well.   To learn more about what you can do with MyChart, go to [email or web address].    Your next appointment:   4 month(s)  The format for your next appointment:   In Person  Provider:   Rollene Rotunda, MD  Thank you for choosing [facility]!!

## 2020-10-14 ENCOUNTER — Other Ambulatory Visit: Payer: Self-pay

## 2020-10-14 ENCOUNTER — Telehealth: Payer: Self-pay | Admitting: Cardiology

## 2020-10-14 MED ORDER — HYDRALAZINE HCL 50 MG PO TABS
50.0000 mg | ORAL_TABLET | Freq: Three times a day (TID) | ORAL | 3 refills | Status: DC
Start: 2020-10-14 — End: 2021-04-07

## 2020-10-14 NOTE — Telephone Encounter (Signed)
*  STAT* If patient is at the pharmacy, call can be transferred to refill team.   1. Which medications need to be refilled? (please list name of each medication and dose if known)  hydrALAZINE (APRESOLINE) 50 MG tablet  2. Which pharmacy/location (including street and city if local pharmacy) is medication to be sent to? [facility] [address] [place] - [address]  3. Do they need a 30 day or 90 day supply? 90

## 2020-10-14 NOTE — Telephone Encounter (Signed)
Prescription was sent to the pharmacy

## 2020-10-14 NOTE — Telephone Encounter (Signed)
    Pt is calling back to follow up, he said he needs refill today since he is out of medications

## 2020-11-24 ENCOUNTER — Ambulatory Visit (HOSPITAL_COMMUNITY): Payer: 59 | Attending: Family Medicine

## 2020-11-24 ENCOUNTER — Encounter (HOSPITAL_COMMUNITY): Payer: Self-pay

## 2020-11-24 ENCOUNTER — Other Ambulatory Visit: Payer: Self-pay

## 2020-11-24 DIAGNOSIS — R531 Weakness: Secondary | ICD-10-CM | POA: Diagnosis present

## 2020-11-24 DIAGNOSIS — M25512 Pain in left shoulder: Secondary | ICD-10-CM | POA: Insufficient documentation

## 2020-11-24 NOTE — Patient Instructions (Signed)
Access Code: [email or web address] URL: [email or web address] Date: 11/24/2020 Prepared by: Shary Decamp  Exercises Thoracic Extension Mobilization on Foam Roll - 1 x daily - 7 x weekly - 3 sets - 10 reps Standing Lower Cervical and Upper Thoracic Stretch - 1 x daily - 7 x weekly

## 2020-11-24 NOTE — Addendum Note (Signed)
Addended by: Dion Body on: 11/24/2020 05:45 PM   Modules accepted: Orders

## 2020-11-24 NOTE — Therapy (Signed)
[facility] [address] [place], [place], [address] Phone: [phone]   Fax:  [phone]  Physical Therapy Evaluation  Patient Details  Name: Ricardo Evans MRN: [id number] Date of Birth: 1981-06-25 Referring Provider (PT): Quintin Alto, MD   Encounter Date: 11/24/2020   PT End of Session - 11/24/20 1715    Visit Number 1    Number of Visits 8    Date for PT Re-Evaluation 12/22/20    Authorization Type [employer]. 30 VL, 0 used    Authorization - Visit Number 1    Authorization - Number of Visits 30    Progress Note Due on Visit 10    PT Start Time 1645    PT Stop Time 1710   decreased time due to patient having prior engagement   PT Time Calculation (min) 25 min    Activity Tolerance Patient tolerated treatment well           Past Medical History:  Diagnosis Date  . Hypertension   . Hypertensive heart disease   . Systolic HF (heart failure) [facility])     Past Surgical History:  Procedure Laterality Date  . SPIGELIAN HERNIA    . TONSILLECTOMY AND ADENOIDECTOMY      There were no vitals filed for this visit.    Subjective Assessment - 11/24/20 1709    Subjective Patient reports left scapula pain x 1 month with sudden onset and no identified mechanism of injury.  Describes pain as sharp, stabbing, and tense which becomes worse as the day progresses and during his job duties at work which require lifting/pushing.  Patient had been prescribed prednisone which heolped during the time he was taking it but has since not made a lasting change.    Currently in Pain? Yes    Pain Score 8     Pain Location Scapula    Pain Orientation Left    Pain Descriptors / Indicators Aching;Stabbing;Nagging    Pain Type Acute pain    Pain Onset 1 to 4 weeks ago    Pain Frequency Constant    Aggravating Factors  prolonged activity, use of LUE, turning head to the left              [facility] PT Assessment - 11/24/20 0001      Assessment    Medical Diagnosis muscle strain, left upper back    Referring Provider (PT) Quintin Alto, MD    Hand Dominance Right      Balance Screen   Has the patient fallen in the past 6 months No    Has the patient had a decrease in activity level because of a fear of falling?  No    Is the patient reluctant to leave their home because of a fear of falling?  No      Prior Function   Vocation Full time employment    Vocation Requirements quality control. Frequent lifting/pushing/walking involved    Leisure Golf      Observation/Other Assessments   Focus on Therapeutic Outcomes (FOTO)  52% function      ROM / Strength   AROM / PROM / Strength Strength      Strength   Strength Assessment Site Shoulder    Right/Left Shoulder Left    Left Shoulder Horizontal ABduction 3-/5      Palpation   Palpation comment tenderness to palpation along left medial border of scapula.  Trigger point appreciated within left rhomboid belly  Objective measurements completed on examination: See above findings.               PT Education - 11/24/20 1715    Education Details pt education on assessment findings and options for treatment    Person(s) Educated Patient    Methods Explanation    Comprehension Verbalized understanding            PT Short Term Goals - 11/24/20 1735      PT SHORT TERM GOAL #1   Title Patient will report pain not exceeding 4/10 with LUE use to improve activity tolerance    Baseline 7-8/10 LUE pain    Time 2    Period Weeks    Status New    Target Date 12/08/20      PT SHORT TERM GOAL #2   Title Patient will demo independence with HEP to improve LUE function    Time 2    Period Weeks    Status New    Target Date 12/08/20             PT Long Term Goals - 11/24/20 1736      PT LONG TERM GOAL #1   Title Patient will improve on FOTO score to meet predicted outcomes to improve functional independence    Baseline 52%  function    Time 4    Period Weeks    Status New    Target Date 12/22/20      PT LONG TERM GOAL #2   Title Patient will demonstrate 5/5 left rhomboid strength without pain to facilitate return to activities    Baseline 3-/5    Time 4    Period Weeks    Status New    Target Date 12/22/20      PT LONG TERM GOAL #3   Title Patient will report pain not exceeding 2/10 left shoulder to improve activity tolerance    Baseline 7-8/10    Time 4    Period Weeks    Status New    Target Date 12/22/20                  Plan - 11/24/20 1730    Clinical Impression Statement Patient is 40 yo male who presents to therapy clinic with left shoulder pain and dysfunction x 1 month with unknown mechanism of injury.  Patient exhibits palpable trigger point in belly of left rhomboid and demonstrates shoulder weakness as a result and limited activity tolerance which has impacted his abilities in his occupation and prevented leisure activities of playing golf.  Patient would benefit from skilled PT services to address this soft tissue dysfunction to minimize pain and restore function and strength to LUE.    Personal Factors and Comorbidities Profession    Examination-Activity Limitations Carry;Lift;Reach Overhead    Examination-Participation Restrictions Cleaning;Occupation;Other   leisure   Stability/Clinical Decision Making Stable/Uncomplicated    Clinical Decision Making Low    Rehab Potential Excellent    PT Frequency 2x / week    PT Duration 4 weeks    PT Treatment/Interventions ADLs/Self Care Home Management;Aquatic Therapy;Cryotherapy;Electrical Stimulation;Ultrasound;Moist Heat;Iontophoresis 4mg /ml Dexamethasone;Therapeutic activities;Therapeutic exercise;Patient/family education;Manual techniques;Taping;Dry needling;Spinal Manipulations;Joint Manipulations    PT Next Visit Plan DRY NEEDLING if indicated, soft tissue mobilization left rhomboids, trigger point release,    PT Home Exercise Plan  foam roll t-spine, Standing Lower Cervical and Upper Thoracic Stretch    Consulted and Agree with Plan of Care Patient  Patient will benefit from skilled therapeutic intervention in order to improve the following deficits and impairments:  Decreased activity tolerance,Decreased endurance,Decreased strength,Increased fascial restricitons,Increased muscle spasms,Impaired UE functional use  Visit Diagnosis: Weakness generalized  Acute pain of left shoulder     Problem List Patient Active Problem List   Diagnosis Date Noted  . Essential hypertension 06/29/2020  . Abnormal CT lung screening 06/29/2020  . 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 Aortic root dilatation (HCC) 06/18/2020  . Cardiomyopathy (HCC) 06/18/2020  . Acute respiratory failure with hypoxemia (HCC) 05/31/2020  . Hilar adenopathy 05/31/2020  . Dyspnea and respiratory abnormalities 05/31/2020  . Dyspnea 05/30/2020  . Hypertensive urgency 05/30/2020  . SPRAIN AND STRAIN OF METACARPOPHALANGEAL OF HAND 01/17/2010    5:43 PM, 11/24/20 M. Shary Decamp, PT, DPT Physical Therapist- [place] Office Number: [phone]  [facility] [address] [place], [place], [address] Phone: [phone]   Fax:  [phone]  Name: LAW CORSINO MRN: [id number] Date of Birth: May 04, 1981

## 2020-11-29 ENCOUNTER — Other Ambulatory Visit: Payer: Self-pay

## 2020-11-29 ENCOUNTER — Ambulatory Visit (HOSPITAL_COMMUNITY): Payer: 59 | Admitting: Physical Therapy

## 2020-11-29 ENCOUNTER — Encounter (HOSPITAL_COMMUNITY): Payer: Self-pay | Admitting: Physical Therapy

## 2020-11-29 DIAGNOSIS — R531 Weakness: Secondary | ICD-10-CM | POA: Diagnosis not present

## 2020-11-29 DIAGNOSIS — M25512 Pain in left shoulder: Secondary | ICD-10-CM

## 2020-11-29 NOTE — Therapy (Signed)
[facility] [address] [place], [place], [address] Phone: [phone]   Fax:  [phone]  Physical Therapy Treatment  Patient Details  Name: Ricardo Evans MRN: [id number] Date of Birth: 1981/05/11 Referring Provider (PT): Quintin Alto, MD   Encounter Date: 11/29/2020   PT End of Session - 11/29/20 1524    Visit Number 2    Number of Visits 8    Date for PT Re-Evaluation 12/22/20    Authorization Type [employer]. 30 VL, 0 used    Authorization - Visit Number 2    Authorization - Number of Visits 30    Progress Note Due on Visit 10    PT Start Time 1525    PT Stop Time 1605    PT Time Calculation (min) 40 min    Activity Tolerance Patient tolerated treatment well           Past Medical History:  Diagnosis Date  . Hypertension   . Hypertensive heart disease   . Systolic HF (heart failure) [facility])     Past Surgical History:  Procedure Laterality Date  . SPIGELIAN HERNIA    . TONSILLECTOMY AND ADENOIDECTOMY      There were no vitals filed for this visit.   Subjective Assessment - 11/29/20 1605    Subjective States he is coming off of work and is about 7-8/10. States it feels like it catches and pinches.    Currently in Pain? Yes    Pain Score 8     Pain Location Shoulder    Pain Orientation Left    Pain Descriptors / Indicators Aching;Tightness;Stabbing    Pain Onset 1 to 4 weeks ago              [facility] PT Assessment - 11/29/20 0001      Assessment   Medical Diagnosis muscle strain, left upper back    Referring Provider (PT) Quintin Alto, MD      Observation/Other Assessments   Observations left shoulder pulled forward, limited scapular movemnet noted on left      ROM / Strength   AROM / PROM / Strength AROM      AROM   Overall AROM Comments performed by visual estimation    AROM Assessment Site Shoulder;Cervical    Right/Left Shoulder Left;Right    Right Shoulder Flexion 140 Degrees    Right  Shoulder ABduction 140 Degrees    Right Shoulder External Rotation 90 Degrees    Left Shoulder Flexion 140 Degrees    Left Shoulder ABduction 140 Degrees   limited upward rotation noted   Left Shoulder Internal Rotation 80 Degrees   scapula posterior tilts to achieve motion   Cervical - Right Rotation 45 degrees   pain in shoulder blade region   Cervical - Left Rotation 45-->60 degrees   felt good, felt like a stretch     Strength   Strength Assessment Site Cervical      Palpation   Spinal mobility limited thoracic, cervical  and rib mobility throughout    Palpation comment very tender along left: pec, lats, shoulder external rotators, romboids      Special Tests   Other special tests traction of cervical spine = stretched neck and thoracic region; traction with cervical rotation - no pain noted along scapular border                         [facility] Adult PT Treatment/Exercise - 11/29/20  0001      Exercises   Exercises Shoulder      Shoulder Exercises: Supine   Other Supine Exercises laying on towel roll down spine - too painful stopped      Shoulder Exercises: Seated   Other Seated Exercises rib cage breathing with tactile cues 2x5 5" holds      Shoulder Exercises: Prone   Other Prone Exercises quadruped - thread the needle x15 5" holds B - felt good      Shoulder Exercises: Sidelying   Other Sidelying Exercises left scapula PROm - spasms/guarding      Manual Therapy   Manual Therapy Joint mobilization;Soft tissue mobilization    Manual therapy comments all manual interventions performed independently of other interventions    Joint Mobilization CPA to C7-T3 grade II - not tolerated well, medial glides to C3-5 left - grade II not tolerated well; AP of left shoulder with and without shoulder PROM grade II - not tolerated well    Soft tissue mobilization STM to left rhomboid, lats and shoulder ER - not tolerated well                  PT Education -  11/29/20 1612    Education Details educated patient on findings, POC, rationale for exercises and HEP    Person(s) Educated Patient    Methods Explanation    Comprehension Verbalized understanding            PT Short Term Goals - 11/24/20 1735      PT SHORT TERM GOAL #1   Title Patient will report pain not exceeding 4/10 with LUE use to improve activity tolerance    Baseline 7-8/10 LUE pain    Time 2    Period Weeks    Status New    Target Date 12/08/20      PT SHORT TERM GOAL #2   Title Patient will demo independence with HEP to improve LUE function    Time 2    Period Weeks    Status New    Target Date 12/08/20             PT Long Term Goals - 11/24/20 1736      PT LONG TERM GOAL #1   Title Patient will improve on FOTO score to meet predicted outcomes to improve functional independence    Baseline 52% function    Time 4    Period Weeks    Status New    Target Date 12/22/20      PT LONG TERM GOAL #2   Title Patient will demonstrate 5/5 left rhomboid strength without pain to facilitate return to activities    Baseline 3-/5    Time 4    Period Weeks    Status New    Target Date 12/22/20      PT LONG TERM GOAL #3   Title Patient will report pain not exceeding 2/10 left shoulder to improve activity tolerance    Baseline 7-8/10    Time 4    Period Weeks    Status New    Target Date 12/22/20                 Plan - 11/29/20 1524    Clinical Impression Statement Patient with limitations in cervical, thoracic and rib mobility with reproduced scapular border symptoms noted with cervical rotation to the right and left shoulder ER secondary to excessive posterior tilting at the scapular border causing pinching along rhomboids.  Educated patient in current presentation and plan to improve spinal mobility and in turn stretch and mobilize adjacent scapular muscles.    Personal Factors and Comorbidities Profession    Examination-Activity Limitations  Carry;Lift;Reach Overhead    Examination-Participation Restrictions Cleaning;Occupation;Other   leisure   Stability/Clinical Decision Making Stable/Uncomplicated    Rehab Potential Excellent    PT Frequency 2x / week    PT Duration 4 weeks    PT Treatment/Interventions ADLs/Self Care Home Management;Aquatic Therapy;Cryotherapy;Electrical Stimulation;Ultrasound;Moist Heat;Iontophoresis 4mg /ml Dexamethasone;Therapeutic activities;Therapeutic exercise;Patient/family education;Manual techniques;Taping;Dry needling;Spinal Manipulations;Joint Manipulations    PT Next Visit Plan thoracic mobility, cervical mobility, scapular protraction and both up/downward rotation stretches, cervical traction, rib cage mobility and breathing with rib cage    PT Home Exercise Plan foam roll t-spine, Standing Lower Cervical and Upper Thoracic Stretch; thread the needle and rib cage breathing.    Consulted and Agree with Plan of Care Patient           Patient will benefit from skilled therapeutic intervention in order to improve the following deficits and impairments:  Decreased activity tolerance,Decreased endurance,Decreased strength,Increased fascial restricitons,Increased muscle spasms,Impaired UE functional use  Visit Diagnosis: Acute pain of left shoulder  Weakness generalized     Problem List Patient Active Problem List   Diagnosis Date Noted  . Essential hypertension 06/29/2020  . Abnormal CT lung screening 06/29/2020  . 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 Aortic root dilatation (HCC) 06/18/2020  . Cardiomyopathy (HCC) 06/18/2020  . Acute respiratory failure with hypoxemia (HCC) 05/31/2020  . Hilar adenopathy 05/31/2020  . Dyspnea and respiratory abnormalities 05/31/2020  . Dyspnea 05/30/2020  . Hypertensive urgency 05/30/2020  . SPRAIN AND STRAIN OF METACARPOPHALANGEAL OF HAND 01/17/2010   4:35 PM, 11/29/20 01/27/21, DPT Physical Therapy with  [facility]  [phone] office  [facility] [address] [place], [place], [place] Phone: [phone]   Fax:  [phone]  Name: Ricardo Evans MRN: Diona Browner Date of Birth: Mar 11, 1981

## 2020-12-02 ENCOUNTER — Ambulatory Visit (HOSPITAL_COMMUNITY): Payer: 59 | Admitting: Physical Therapy

## 2020-12-03 ENCOUNTER — Other Ambulatory Visit (HOSPITAL_COMMUNITY): Payer: Self-pay | Admitting: Family Medicine

## 2020-12-03 ENCOUNTER — Ambulatory Visit (HOSPITAL_COMMUNITY)
Admission: RE | Admit: 2020-12-03 | Discharge: 2020-12-03 | Disposition: A | Discharge status: Home / Self Care | Payer: 59 | Source: Ambulatory Visit | Attending: Family Medicine | Admitting: Family Medicine

## 2020-12-03 ENCOUNTER — Other Ambulatory Visit: Payer: Self-pay | Admitting: Family Medicine

## 2020-12-03 ENCOUNTER — Other Ambulatory Visit: Payer: Self-pay

## 2020-12-03 DIAGNOSIS — K5792 Diverticulitis of intestine, part unspecified, without perforation or abscess without bleeding: Secondary | ICD-10-CM

## 2020-12-03 LAB — POCT I-STAT CREATININE: Creatinine, Ser: 1.8 mg/dL — ABNORMAL HIGH (ref 0.61–1.24)

## 2020-12-03 IMAGING — CT CT ABD-PELV W/ CM
2 of 4 series · 16 of 46 positions shown, 18 images · IV contrast (Omnipaque or Isovue)
Comparison: None.

CLINICAL DATA: Left-sided abdominal pain for 1 week

EXAM:
CT ABDOMEN AND PELVIS WITH CONTRAST
TECHNIQUE: Multidetector CT imaging of the abdomen and pelvis was performed
using the standard protocol following bolus administration of
intravenous contrast.
CONTRAST:  80mL OMNIPAQUE IOHEXOL 300 MG/ML  SOLN

[Series 2: axial st · axial · 0.88mm/px · z∈[+864,+1324]mm · 13 of 102 slices shown, 15 images]
[im 5/102  soft-tissue]
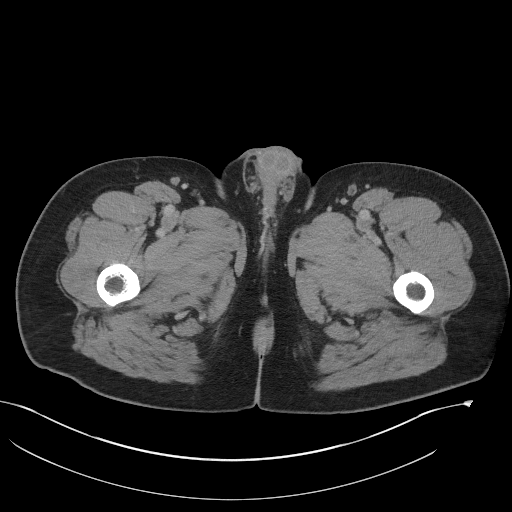
[im 5/102  bone]
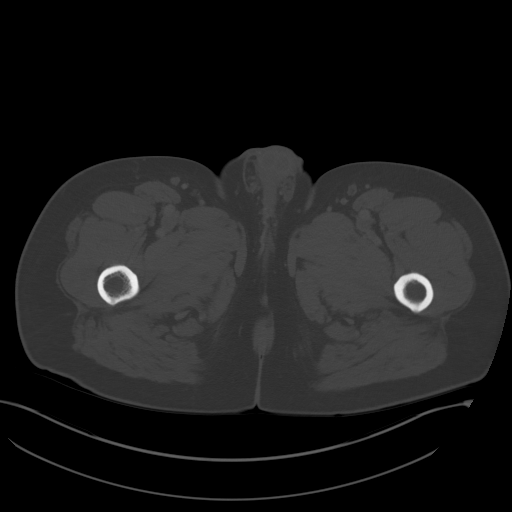
[im 14/102  soft-tissue]
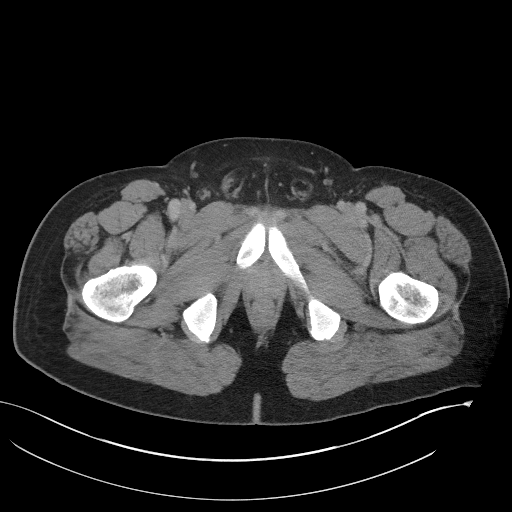
[im 22/102  soft-tissue]
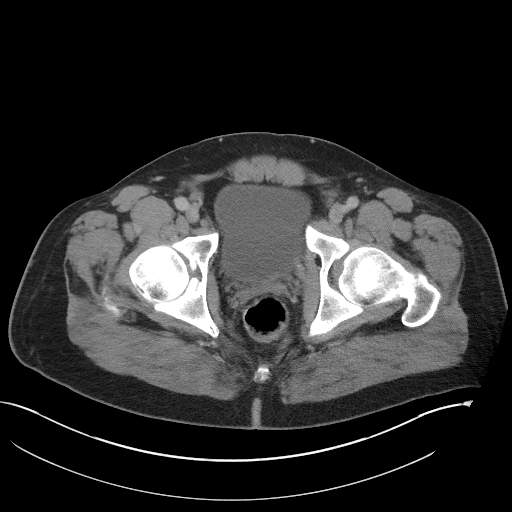
[im 27/102  soft-tissue]
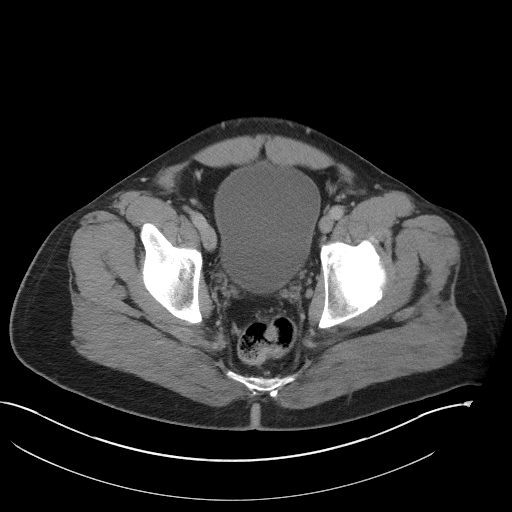
[im 36/102  soft-tissue]
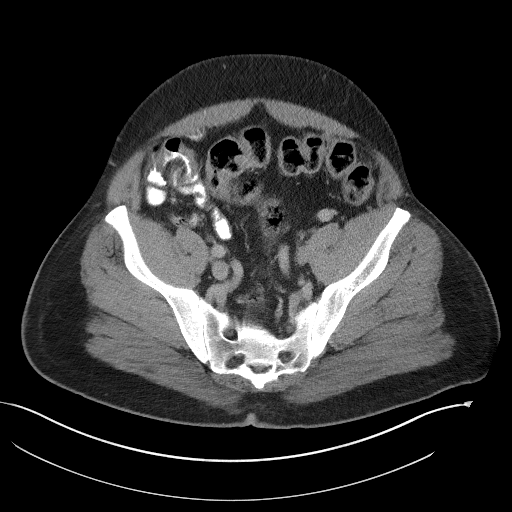
[im 44/102  soft-tissue]
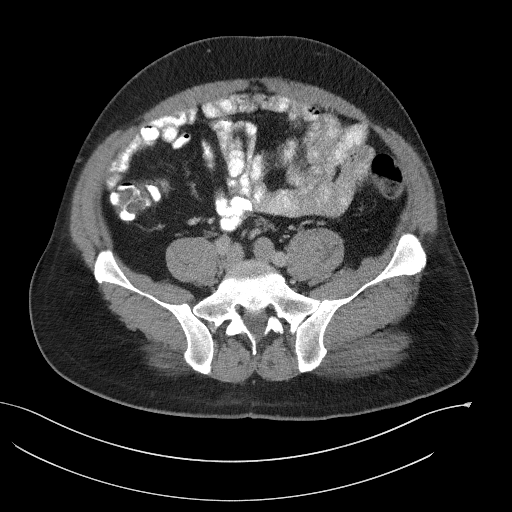
[im 53/102  soft-tissue]
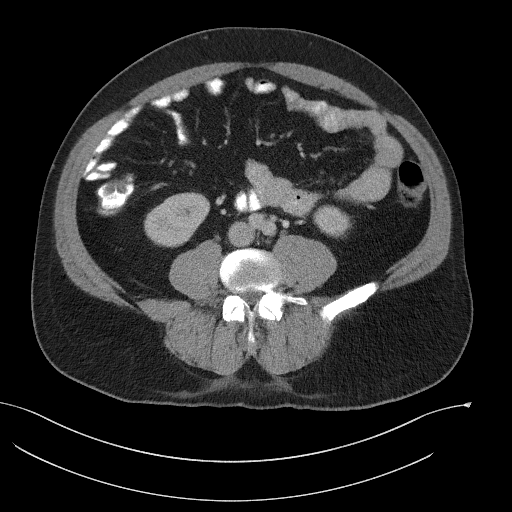
[im 58/102  soft-tissue]
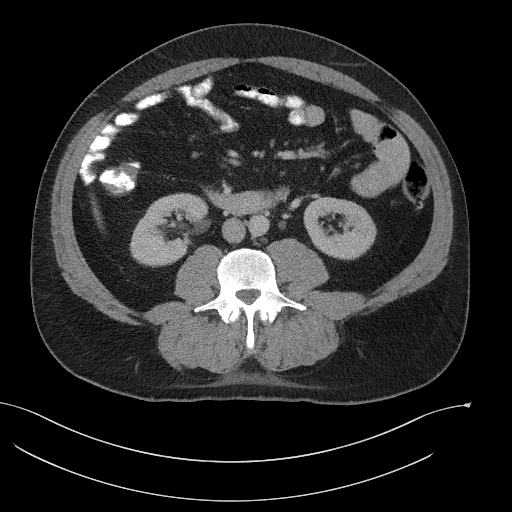
[im 66/102  soft-tissue]
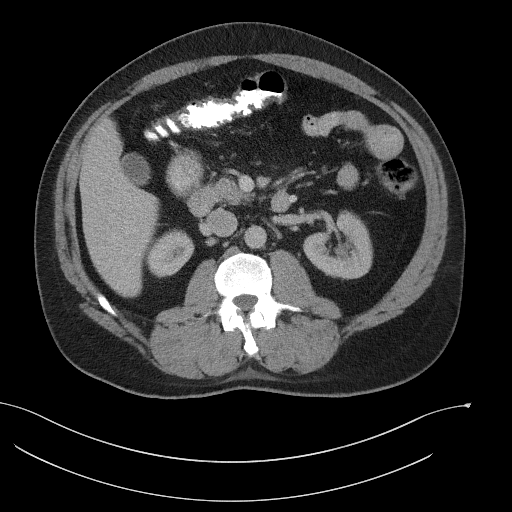
[im 66/102  bone]
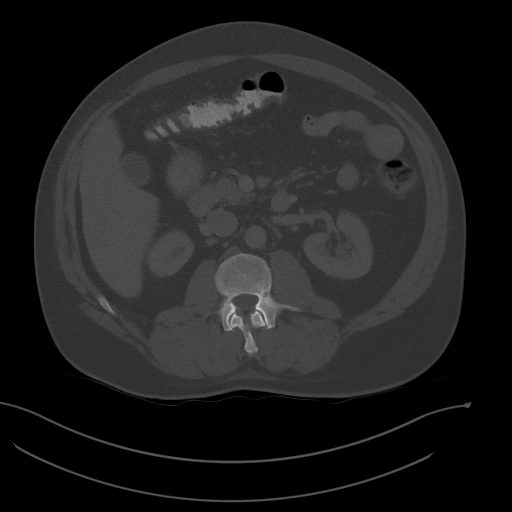
[im 75/102  soft-tissue]
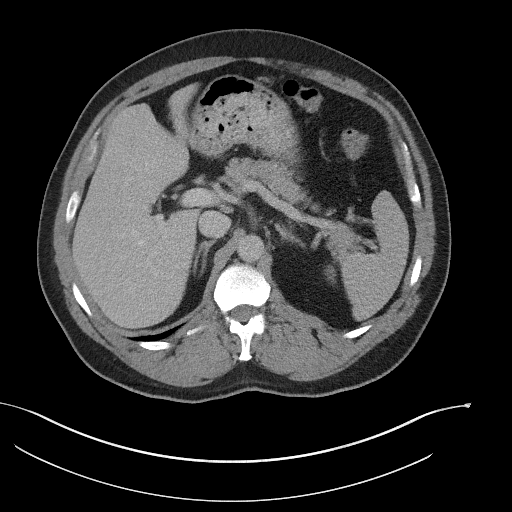
[im 80/102  soft-tissue]
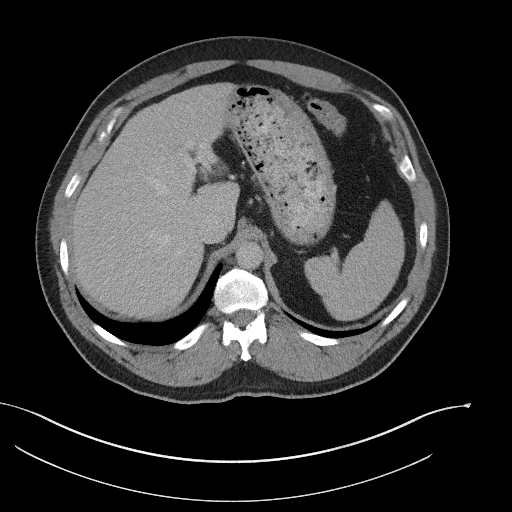
[im 88/102  soft-tissue]
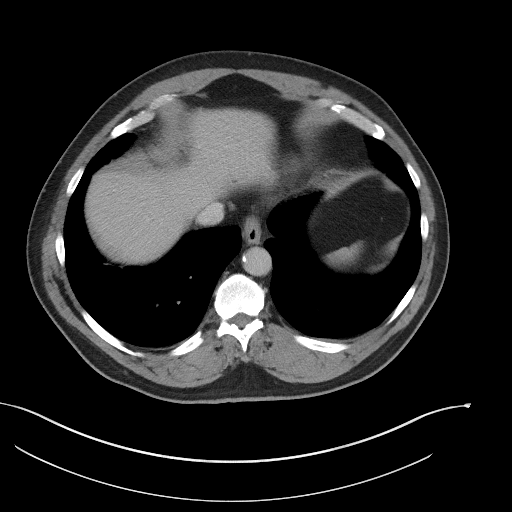
[im 97/102  soft-tissue]
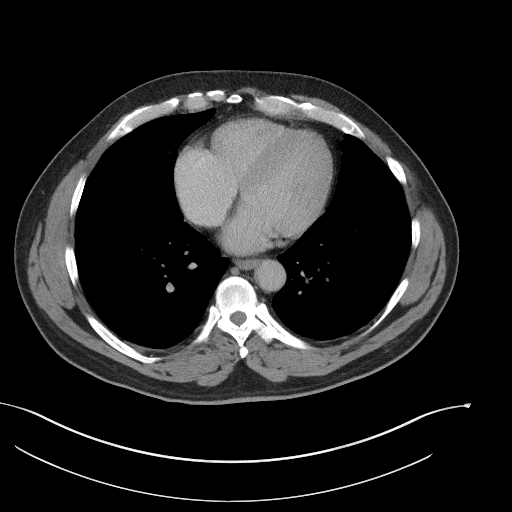

[Series 5: coronal st · coronal · 0.86mm/px · 3 of 128 slices shown]
[im 43/128  soft-tissue]
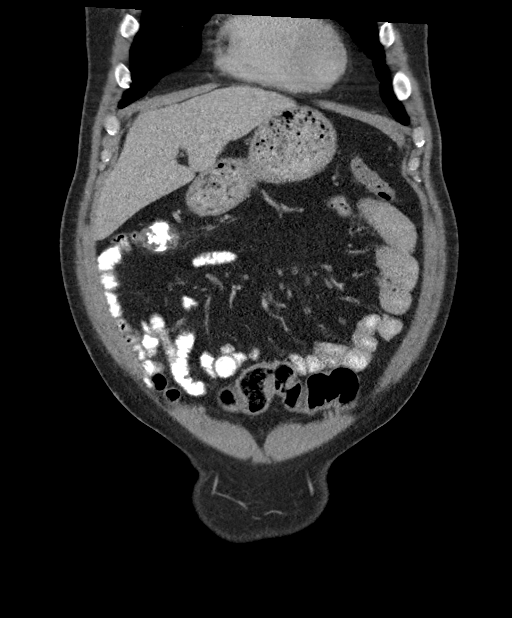
[im 57/128  soft-tissue]
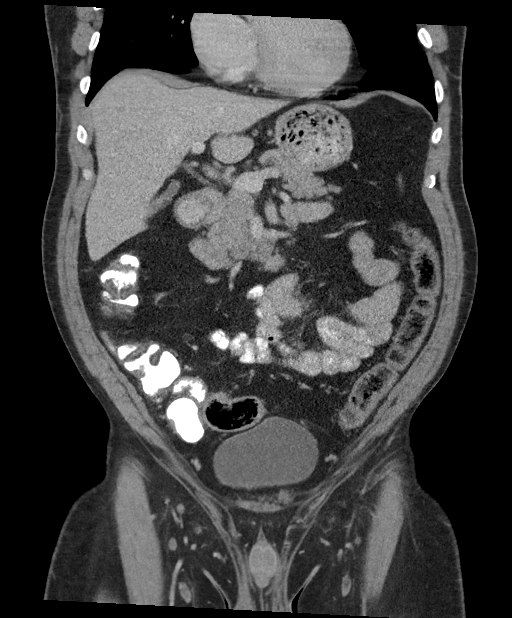
[im 71/128  soft-tissue]
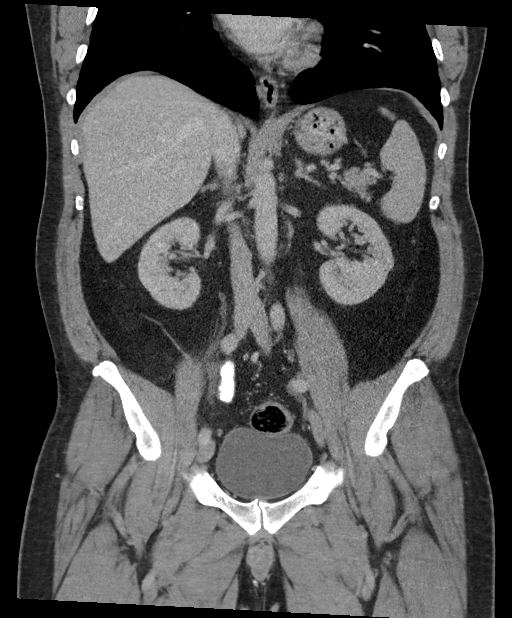

[16 of 46 positions shown; findings below may reference images not displayed]

FINDINGS: Lower chest: No acute abnormality.

Hepatobiliary: No focal liver abnormality is seen. No gallstones,
gallbladder wall thickening, or biliary dilatation.

Pancreas: Unremarkable. No pancreatic ductal dilatation or
surrounding inflammatory changes.

Spleen: Normal in size without focal abnormality.

Adrenals/Urinary Tract: Adrenal glands are unremarkable. Kidneys are
normal, without renal calculi, focal lesion, or hydronephrosis.
Bladder is unremarkable.

Stomach/Bowel: Stomach is within normal limits. Appendix appears
normal. No evidence of bowel wall thickening, distention, or
inflammatory changes.

Vascular/Lymphatic: No significant vascular findings are present. No
enlarged abdominal or pelvic lymph nodes.

Reproductive: Prostate is unremarkable.

Other: No abdominal wall hernia or abnormality. No abdominopelvic
ascites.

Musculoskeletal: No acute or significant osseous findings.
IMPRESSION: No acute abdominal or pelvic pathology.

## 2020-12-03 MED ORDER — IOHEXOL 9 MG/ML PO SOLN
ORAL | Status: AC
  Filled 2020-12-03: qty 1000

## 2020-12-03 MED ORDER — IOHEXOL 300 MG/ML  SOLN
100.0000 mL | Freq: Once | INTRAMUSCULAR | Status: AC | PRN
  Administered 2020-12-03: 80 mL via INTRAVENOUS

## 2020-12-07 ENCOUNTER — Ambulatory Visit (HOSPITAL_COMMUNITY): Payer: 59 | Admitting: Physical Therapy

## 2020-12-07 ENCOUNTER — Other Ambulatory Visit: Payer: Self-pay

## 2020-12-07 ENCOUNTER — Encounter (HOSPITAL_COMMUNITY): Payer: Self-pay | Admitting: Physical Therapy

## 2020-12-07 DIAGNOSIS — R531 Weakness: Secondary | ICD-10-CM | POA: Diagnosis not present

## 2020-12-07 DIAGNOSIS — M25512 Pain in left shoulder: Secondary | ICD-10-CM

## 2020-12-07 NOTE — Therapy (Signed)
[facility] [address] [place], [place], [address] Phone: [phone]   Fax:  [phone]  Physical Therapy Treatment  Patient Details  Name: Ricardo Evans MRN: [id number] Date of Birth: Mar 05, 1981 Referring Provider (PT): Quintin Alto, MD   Encounter Date: 12/07/2020   PT End of Session - 12/07/20 1446    Visit Number 3    Number of Visits 8    Date for PT Re-Evaluation 12/22/20    Authorization Type [employer]. 30 VL, 0 used    Authorization - Visit Number 3    Authorization - Number of Visits 30    Progress Note Due on Visit 10    PT Start Time 1445    PT Stop Time 1523    PT Time Calculation (min) 38 min    Activity Tolerance Patient tolerated treatment well           Past Medical History:  Diagnosis Date  . Hypertension   . Hypertensive heart disease   . Systolic HF (heart failure) [facility])     Past Surgical History:  Procedure Laterality Date  . SPIGELIAN HERNIA    . TONSILLECTOMY AND ADENOIDECTOMY      There were no vitals filed for this visit.   Subjective Assessment - 12/07/20 1448    Subjective States that he feels it is getting better and he has been stretching a lot and that seems to help. States that he feels about 75% better since the start of PT.    Currently in Pain? Yes    Pain Onset 1 to 4 weeks ago              [facility] PT Assessment - 12/07/20 0001      Assessment   Medical Diagnosis muscle strain, left upper back    Referring Provider (PT) Quintin Alto, MD                         [facility] Adult PT Treatment/Exercise - 12/07/20 0001      Shoulder Exercises: Supine   Other Supine Exercises trunk rotation 2 minutes - 2 sets      Shoulder Exercises: Prone   Other Prone Exercises quadruped - thread the needle x15 5" holds B - felt good, cat dog 2  2 minutes 5" holds in positions   PT over pressure as needed     Shoulder Exercises: Sidelying   Other Sidelying Exercises  book stretch 2 minutes x2 sets      Shoulder Exercises: Standing   Other Standing Exercises scarecrow at wall 3x15 B 3" holds; thoracic stretchawiht cervical flexion 2x15 10" holds    Other Standing Exercises on supine/side lying exercises in standing (book stretch and trunk rotation)                  PT Education - 12/07/20 1457    Education Details on importance of mobility work and stretches in regards to symptoms    Person(s) Educated Patient    Methods Explanation    Comprehension Verbalized understanding            PT Short Term Goals - 11/24/20 1735      PT SHORT TERM GOAL #1   Title Patient will report pain not exceeding 4/10 with LUE use to improve activity tolerance    Baseline 7-8/10 LUE pain    Time 2    Period Weeks    Status New  Target Date 12/08/20      PT SHORT TERM GOAL #2   Title Patient will demo independence with HEP to improve LUE function    Time 2    Period Weeks    Status New    Target Date 12/08/20             PT Long Term Goals - 11/24/20 1736      PT LONG TERM GOAL #1   Title Patient will improve on FOTO score to meet predicted outcomes to improve functional independence    Baseline 52% function    Time 4    Period Weeks    Status New    Target Date 12/22/20      PT LONG TERM GOAL #2   Title Patient will demonstrate 5/5 left rhomboid strength without pain to facilitate return to activities    Baseline 3-/5    Time 4    Period Weeks    Status New    Target Date 12/22/20      PT LONG TERM GOAL #3   Title Patient will report pain not exceeding 2/10 left shoulder to improve activity tolerance    Baseline 7-8/10    Time 4    Period Weeks    Status New    Target Date 12/22/20                 Plan - 12/07/20 1515    Clinical Impression Statement Continued working on spinal mobility as tolerated and patient reported significantly reduced symptoms afterwards. Patient without pain with cervical rotation and with  shoulder external rotation on this date. Cues to breath throughout session and tactile cues for spinal flexion and extension. Overall symptoms improving with rib/spinal mobility and patient would continue to benefit from skilled physical therapy to improve overall symptoms and quality of life.    Personal Factors and Comorbidities Profession    Examination-Activity Limitations Carry;Lift;Reach Overhead    Examination-Participation Restrictions Cleaning;Occupation;Other   leisure   Stability/Clinical Decision Making Stable/Uncomplicated    Rehab Potential Excellent    PT Frequency 2x / week    PT Duration 4 weeks    PT Treatment/Interventions ADLs/Self Care Home Management;Aquatic Therapy;Cryotherapy;Electrical Stimulation;Ultrasound;Moist Heat;Iontophoresis 4mg /ml Dexamethasone;Therapeutic activities;Therapeutic exercise;Patient/family education;Manual techniques;Taping;Dry needling;Spinal Manipulations;Joint Manipulations    PT Next Visit Plan potentially reassess pending progress, thoracic/spinal mobility, scapular protraction and both up/downward rotation stretches, rib cage mobility and breathing with rib cage    PT Home Exercise Plan foam roll t-spine, Standing Lower Cervical and Upper Thoracic Stretch; thread the needle and rib cage breathing.; 2/15 scare crow, book stretch, cat/cow, spinal twist    Consulted and Agree with Plan of Care Patient           Patient will benefit from skilled therapeutic intervention in order to improve the following deficits and impairments:  Decreased activity tolerance,Decreased endurance,Decreased strength,Increased fascial restricitons,Increased muscle spasms,Impaired UE functional use  Visit Diagnosis: Acute pain of left shoulder  Weakness generalized     Problem List Patient Active Problem List   Diagnosis Date Noted  . Essential hypertension 06/29/2020  . Abnormal CT lung screening 06/29/2020  . 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 Aortic root dilatation (HCC) 06/18/2020  . Cardiomyopathy (HCC) 06/18/2020  . Acute respiratory failure with hypoxemia (HCC) 05/31/2020  . Hilar adenopathy 05/31/2020  . Dyspnea and respiratory abnormalities 05/31/2020  . Dyspnea 05/30/2020  . Hypertensive urgency 05/30/2020  . SPRAIN AND STRAIN OF METACARPOPHALANGEAL OF  HAND 01/17/2010    3:26 PM, 12/07/20 Tereasa Coop, DPT Physical Therapy with [facility]  [phone] office   [facility] [address] [place], [place], [address] Phone: [phone]   Fax:  [phone]  Name: Ricardo Evans MRN: [id number] Date of Birth: 01/19/1981

## 2020-12-07 NOTE — Progress Notes (Signed)
Cardiology Office Note   Date:  12/08/2020   ID:  Ricardo Evans, DOB 1980-11-20, MRN [id number]  PCP:  [name], MD  Cardiologist:   Rollene Rotunda, MD   Chief Complaint  Patient presents with  . Shortness of Breath     History of Present Illness: Ricardo Evans is a 40 y.o. male who is referred from the hospital with HTN and acute systolic and diastolic heart failure.  He was in the hospital in August.   He presented with SOB and hypertensive urgency.  Echo EF was 45 - 50% with severe left ventricular hypertrophy.  He was sent home in increased dose of Coreg and amlodipine.  Of note there was no evidence of RAS on CT.  He was not seen by cardiology during that visit.   In the hospital he was noted to have a BNP of 265.  Potassium initially was low at 3.1.  He did have some evidence of vascular prominence suggestive of volume overload on his chest x-ray.  He was treated with IV labetalol.  An MRA with no evidence of renal artery stenosis.  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  There was a mention of aortic atherosclerosis.  MRA demonstrated widely patent renal arteries.  There were 2 on the right and a single on the left.  He returns for follow up.  Since I last saw him he has done very well.  His blood pressure in the 120s over 70s.  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  He says his fatigue is better.  He is not having any shortness of breath that he was having.  He had some abdominal issues and recently had a CT abdomen pelvis from found to have any diverticulitis the main consideration.  He did show me his blood work and his creatinine has been elevated 1.8 and this is being followed by [name], MD    Past Medical History:  Diagnosis Date  . Hypertension   . Hypertensive heart disease   . Systolic HF (heart failure) [facility])     Past Surgical History:  Procedure Laterality Date  . SPIGELIAN HERNIA    . TONSILLECTOMY AND  ADENOIDECTOMY       Current Outpatient Medications  Medication Sig Dispense Refill  . acetaminophen (TYLENOL) 325 MG tablet Take 2 tablets (650 mg total) by mouth every 6 (six) hours as needed for mild pain (or Fever >/= 101). 12 tablet 0  . carvedilol (COREG) 12.5 MG tablet Take 1 tablet (12.5 mg total) by mouth 2 (two) times daily with a meal. For BP 180 tablet 3  . hydrALAZINE (APRESOLINE) 50 MG tablet Take 1 tablet (50 mg total) by mouth 3 (three) times daily. For BP 90 tablet 3  . methocarbamol (ROBAXIN) 500 MG tablet Take 500 mg by mouth at bedtime.    . Olmesartan-amLODIPine-HCTZ 40-10-25 MG TABS Take 1 tablet by mouth daily. 90 tablet 3  . hydrOXYzine (ATARAX/VISTARIL) 25 MG tablet Take 1 tablet (25 mg total) by mouth at bedtime as needed. (Patient not taking: No sig reported) 30 tablet 1  . spironolactone (ALDACTONE) 50 MG tablet Take 1 tablet (50 mg total) by mouth daily. 90 tablet 3   No current facility-administered medications for this visit.    Allergies:   Cephalexin, Penicillins, and Sulfonamide derivatives    Social History:  The patient   ROS:  Please see the history of present  illness.   Otherwise, review of systems are positive for none.   All other systems are reviewed and negative.    PHYSICAL EXAM: VS:  BP (!) 130/98   Pulse 80   Ht 6' (1.829 m)   Wt 237 lb (107.5 kg)   BMI 32.14 kg/m  , BMI Body mass index is 32.14 kg/m. GENERAL:  Well appearing NECK:  No jugular venous distention, waveform within normal limits, carotid upstroke brisk and symmetric, no bruits, no thyromegaly LUNGS:  Clear to auscultation bilaterally CHEST:  Unremarkable HEART:  PMI not displaced or sustained,S1 and S2 within normal limits, no S3, no S4, no clicks, no rubs, no murmurs ABD:  Flat, positive bowel sounds normal in frequency in pitch, no bruits, no rebound, no guarding, no midline pulsatile mass, no hepatomegaly, no splenomegaly EXT:  2 plus pulses throughout, no edema, no  cyanosis no clubbing   EKG:  EKG is not ordered today.    Recent Labs: 05/30/2020: ALT 23; B Natriuretic Peptide 265.0; Hemoglobin 14.2; Magnesium 1.7; Platelets 171 06/29/2020: BUN 23; Potassium 4.3; Sodium 140 12/03/2020: Creatinine, Ser 1.80    Lipid Panel No results found for: CHOL, TRIG, HDL, CHOLHDL, VLDL, LDLCALC, LDLDIRECT    Wt Readings from Last 3 Encounters:  12/08/20 237 lb (107.5 kg)  08/11/20 220 lb (99.8 kg)  06/30/20 211 lb (95.7 kg)      Other studies Reviewed: Additional studies/ records that were reviewed today include:  Labs, CT Review of the above records demonstrates:  Please see elsewhere in the note.     ASSESSMENT AND PLAN:  ACUTE SYSTOLIC HF:    I think this was systolic and diastolic dysfunction and is now well controlled on the meds as listed.  He understands salt and fluid restriction.  No change in therapy and I will pursue further imaging as below.  HTN:   His blood pressure is controlled.  I will defer to Dr. Dimas Aguas to follow his electrolytes routinely.  I think he is tolerating the meds as listed.   DILATED AORTIC ROOT: He will need follow-up later this year.  I will consider CT angiography versus MRI versus just following with echo.  He will need an echocardiogram to follow-up his ejection fraction and left ventricular hypertrophy.  I will start with that and then based on his renal function consider the next imaging.   CKD:   Creat 1.8 recently.  He will need close follow-up with this per Dr. Dimas Aguas.  ABNORMAL CT:   He had lymphadenopathy and other abnormalities noted when he was in the hospital in August.  Suggestion was a 58-month follow-up and I will order a noncontrasted CT of his chest. \    Current medicines are reviewed at length with the patient today.  The patient does not have concerns regarding medicines.  The following changes have been made: None  Labs/ tests ordered today include:   Orders Placed This Encounter  Procedures   . CT Chest Wo Contrast     Disposition:   FU with me in 6 months.   Signed, Rollene Rotunda, MD  12/08/2020 10:15 AM    [facility]

## 2020-12-08 ENCOUNTER — Ambulatory Visit (INDEPENDENT_AMBULATORY_CARE_PROVIDER_SITE_OTHER): Payer: 59 | Admitting: Cardiology

## 2020-12-08 ENCOUNTER — Encounter: Payer: Self-pay | Admitting: Cardiology

## 2020-12-08 VITALS — BP 130/98 | HR 80 | Ht 72.0 in | Wt 237.0 lb

## 2020-12-08 DIAGNOSIS — I77810 Thoracic aortic ectasia: Secondary | ICD-10-CM

## 2020-12-08 DIAGNOSIS — I10 Essential (primary) hypertension: Secondary | ICD-10-CM | POA: Diagnosis not present

## 2020-12-08 DIAGNOSIS — R590 Localized enlarged lymph nodes: Secondary | ICD-10-CM

## 2020-12-08 DIAGNOSIS — I5021 Acute systolic (congestive) heart failure: Secondary | ICD-10-CM

## 2020-12-08 MED ORDER — SPIRONOLACTONE 50 MG PO TABS: 50.0000 mg | ORAL_TABLET | Freq: Every day | ORAL | 3 refills | Status: DC

## 2020-12-08 NOTE — Patient Instructions (Signed)
Medication Instructions:  The current medical regimen is effective;  continue present plan and medications.  *If you need a refill on your cardiac medications before your next appointment, please call your pharmacy*  Testing/Procedures: Non-Cardiac CT scanning of your chest, (CAT scanning), is a noninvasive, special x-ray that produces cross-sectional images of the body using x-rays and a computer. CT scans help physicians diagnose and treat medical conditions. CT scans provide greater clarity and reveal more details than regular x-ray exams.   This testing will be completed at [facility] and will be without contrast.  You will be contacted to be scheduled.  Follow-Up: At [facility], you and your health needs are our priority.  As part of our continuing mission to provide you with exceptional heart care, we have created designated Provider Care Teams.  These Care Teams include your primary Cardiologist (physician) and Advanced Practice Providers (APPs -  Physician Assistants and Nurse Practitioners) who all work together to provide you with the care you need, when you need it.  We recommend signing up for the patient portal called "MyChart".  Sign up information is provided on this After Visit Summary.  MyChart is used to connect with patients for Virtual Visits (Telemedicine).  Patients are able to view lab/test results, encounter notes, upcoming appointments, etc.  Non-urgent messages can be sent to your provider as well.   To learn more about what you can do with MyChart, go to [email or web address].    Your next appointment:   6 month(s)  The format for your next appointment:   In Person  Provider:   Rollene Rotunda, MD   Thank you for choosing [facility]!!

## 2020-12-09 ENCOUNTER — Other Ambulatory Visit: Payer: Self-pay

## 2020-12-09 ENCOUNTER — Ambulatory Visit (HOSPITAL_COMMUNITY): Payer: 59 | Admitting: Physical Therapy

## 2020-12-09 ENCOUNTER — Encounter (HOSPITAL_COMMUNITY): Payer: Self-pay | Admitting: Physical Therapy

## 2020-12-09 DIAGNOSIS — M25512 Pain in left shoulder: Secondary | ICD-10-CM

## 2020-12-09 DIAGNOSIS — R531 Weakness: Secondary | ICD-10-CM

## 2020-12-09 NOTE — Therapy (Signed)
[facility] [address] [place], [place], [address] Phone: [phone]   Fax:  [phone]  Physical Therapy Treatment  Patient Details  Name: Ricardo Evans MRN: [id number] Date of Birth: Jun 11, 1981 Referring Provider (PT): Judd Lien, MD  PHYSICAL THERAPY DISCHARGE SUMMARY  Visits from Start of Care: 4  Current functional level related to goals / functional outcomes: See below    Remaining deficits: See below    Education / Equipment:  See assessment  Plan: Patient agrees to discharge.  Patient goals were partially met. Patient is being discharged due to being pleased with the current functional level.  ?????       Encounter Date: 12/09/2020   PT End of Session - 12/09/20 1700    Visit Number 4    Number of Visits 8    Date for PT Re-Evaluation 12/22/20    Authorization Type [employer]. 30 VL, 0 used    Authorization - Visit Number 4    Authorization - Number of Visits 30    Progress Note Due on Visit 10    PT Start Time 7014    PT Stop Time 1724    PT Time Calculation (min) 39 min    Activity Tolerance Patient tolerated treatment well           Past Medical History:  Diagnosis Date  . Hypertension   . Hypertensive heart disease   . Systolic HF (heart failure) [facility])     Past Surgical History:  Procedure Laterality Date  . SPIGELIAN HERNIA    . TONSILLECTOMY AND ADENOIDECTOMY      There were no vitals filed for this visit.   Subjective Assessment - 12/09/20 1659    Subjective Patient says he is doing really good. He says the exercises from last time have helped a lot. He has not had any pain since. He feels good and ready for DC today.    Currently in Pain? No/denies    Pain Onset 1 to 4 weeks ago              [facility] PT Assessment - 12/09/20 0001      Assessment   Medical Diagnosis muscle strain, left upper back    Referring Provider (PT) Judd Lien, MD      Prior Function    Vocation Full time employment    Vocation Requirements quality control. Frequent lifting/pushing/walking involved    Leisure Golf      Cognition   Overall Cognitive Status Within Functional Limits for tasks assessed      Observation/Other Assessments   Focus on Therapeutic Outcomes (FOTO)  100%   was 52%     AROM   Overall AROM Comments Bilateral shoulder AROM WFL      Strength   Left Shoulder Horizontal ABduction 4/5   was 3-                        [facility] Adult PT Treatment/Exercise - 12/09/20 0001      Shoulder Exercises: Sidelying   Other Sidelying Exercises open book stretch 5 x 10" each      Shoulder Exercises: Standing   Other Standing Exercises scarecrow x10      Shoulder Exercises: Stretch   Other Shoulder Stretches threading needle in quadruped x10 each    Other Shoulder Stretches Cat/ cow in quadruped x 10  PT Education - 12/09/20 1722    Education Details on assessment, progresss toward therapy goals, HEP review and transition to HEP for DC    Person(s) Educated Patient    Methods Explanation;Handout    Comprehension Verbalized understanding            PT Short Term Goals - 12/09/20 1725      PT SHORT TERM GOAL #1   Title Patient will report pain not exceeding 4/10 with LUE use to improve activity tolerance    Baseline Reports 0 pain    Time 2    Period Weeks    Status Achieved    Target Date 12/08/20      PT SHORT TERM GOAL #2   Title Patient will demo independence with HEP to improve LUE function    Baseline Reports compliance and demos good return    Time 2    Period Weeks    Status Achieved    Target Date 12/08/20             PT Long Term Goals - 12/09/20 1725      PT LONG TERM GOAL #1   Title Patient will improve on FOTO score to meet predicted outcomes to improve functional independence    Baseline Current 100% function    Time 4    Period Weeks    Status Achieved      PT LONG TERM GOAL #2    Title Patient will demonstrate 5/5 left rhomboid strength without pain to facilitate return to activities    Baseline 4+/5    Time 4    Period Weeks    Status Partially Met      PT LONG TERM GOAL #3   Title Patient will report pain not exceeding 2/10 left shoulder to improve activity tolerance    Baseline Current pain 0/10    Time 4    Period Weeks    Status Achieved                 Plan - 12/09/20 1819    Clinical Impression Statement Reassessed patient per request for self-DC. Patient has met all goals except rhomboid strength, but does show significant improvement. Patient currently reporting 0/10 pain and independence with HEP. Reviewed current HEP plan and discussed ther ex progressions. Issued updated HEP handout. Patient being DC today to transition to HEP. Patient encouraged to follow up with physical therapy with any further questions or concerns    Personal Factors and Comorbidities Profession    Examination-Activity Limitations Carry;Lift;Reach Overhead    Examination-Participation Restrictions Cleaning;Occupation;Other   leisure   Stability/Clinical Decision Making Stable/Uncomplicated    Rehab Potential Excellent    PT Frequency 2x / week    PT Duration 4 weeks    PT Treatment/Interventions ADLs/Self Care Home Management;Aquatic Therapy;Cryotherapy;Electrical Stimulation;Ultrasound;Moist Heat;Iontophoresis 67m/ml Dexamethasone;Therapeutic activities;Therapeutic exercise;Patient/family education;Manual techniques;Taping;Dry needling;Spinal Manipulations;Joint Manipulations    PT Next Visit Plan DC to HEP    PT Home Exercise Plan foam roll t-spine, Standing Lower Cervical and Upper Thoracic Stretch; thread the needle and rib cage breathing.; 2/15 scare crow, book stretch, cat/cow, spinal twist 2/17 pec stretch, wall angels, band horizontal abduction    Consulted and Agree with Plan of Care Patient           Patient will benefit from skilled therapeutic  intervention in order to improve the following deficits and impairments:  Decreased activity tolerance,Decreased endurance,Decreased strength,Increased fascial restricitons,Increased muscle spasms,Impaired UE functional use  Visit Diagnosis: Acute pain  of left shoulder  Weakness generalized     Problem List Patient Active Problem List   Diagnosis Date Noted  . Essential hypertension 06/29/2020  . Abnormal CT lung screening 06/29/2020  . 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 Aortic root dilatation ([place]) 06/18/2020  . Cardiomyopathy ([place]) 06/18/2020  . Acute respiratory failure with hypoxemia ([place]) 05/31/2020  . Hilar adenopathy 05/31/2020  . Dyspnea and respiratory abnormalities 05/31/2020  . Dyspnea 05/30/2020  . Hypertensive urgency 05/30/2020  . SPRAIN AND STRAIN OF METACARPOPHALANGEAL OF HAND 01/17/2010    6:28 PM, 12/09/20 Josue Hector PT DPT  Physical Therapist with [facility]  [phone] [address] [place], [place], [address] Phone: [phone]   Fax:  [phone]  Name: NIKOLAJ GERAGHTY MRN: [id number] Date of Birth: 12-26-1980

## 2020-12-09 NOTE — Patient Instructions (Signed)
Access Code: [email or web address] URL: [email or web address] Date: 12/09/2020 Prepared by: Georges Lynch  Exercises Wall Angels - 1 x daily - 4 x weekly - 2 sets - 10 reps Standing Shoulder Horizontal Abduction with Resistance - 1 x daily - 4 x weekly - 2 sets - 10 reps Single Arm Doorway Pec Stretch at 90 Degrees Abduction - 1 x daily - 4 x weekly - 1 sets - 3 reps - 30 seconds hold

## 2020-12-10 ENCOUNTER — Ambulatory Visit (HOSPITAL_COMMUNITY): Payer: 59

## 2020-12-14 ENCOUNTER — Ambulatory Visit (HOSPITAL_COMMUNITY): Payer: 59 | Admitting: Physical Therapy

## 2020-12-14 ENCOUNTER — Other Ambulatory Visit: Payer: Self-pay | Admitting: Cardiology

## 2020-12-16 ENCOUNTER — Encounter (HOSPITAL_COMMUNITY): Payer: 59 | Admitting: Physical Therapy

## 2020-12-20 ENCOUNTER — Telehealth: Payer: Self-pay | Admitting: Cardiology

## 2020-12-20 NOTE — Telephone Encounter (Signed)
I called and spoke with him.  He misunderstood.  It was not a renal CT or MRI but he needed.  This was a follow-up of size of his aorta as well as the lymphadenopathy on his chest CT that he needs repeated.  No renal imaging is indicated.

## 2020-12-20 NOTE — Telephone Encounter (Signed)
Spoke with pt, he reports that based on the lab work that dr hochrein was going to check his kidneys with a scan. Aware there is nothing mentioned in the office note so will forward to dr hochrein for his review.

## 2020-12-20 NOTE — Telephone Encounter (Signed)
New message    Patient wants to have chest ct and ct of kidney done at the same time, there is no order for Ct of Kidneys? Patient states that he spoke to Dr Antoine Poche about this at his last appointment and Dr Antoine Poche agreed to order the Ct of Kidneys so that he can have them at the same time? Please call

## 2020-12-21 ENCOUNTER — Telehealth: Payer: Self-pay | Admitting: Cardiology

## 2020-12-21 ENCOUNTER — Encounter (HOSPITAL_COMMUNITY): Payer: 59 | Admitting: Physical Therapy

## 2020-12-21 NOTE — Telephone Encounter (Signed)
Pre-cert Verification for the following procedure    CT CHEST W/O CONTRAST   DATE: 01/04/2021  LOCATION:   [facility]

## 2020-12-23 ENCOUNTER — Encounter (HOSPITAL_COMMUNITY): Payer: 59 | Admitting: Physical Therapy

## 2021-01-04 ENCOUNTER — Other Ambulatory Visit: Payer: Self-pay

## 2021-01-04 ENCOUNTER — Ambulatory Visit (HOSPITAL_COMMUNITY)
Admission: RE | Admit: 2021-01-04 | Discharge: 2021-01-04 | Disposition: A | Discharge status: Home / Self Care | Payer: 59 | Source: Ambulatory Visit | Attending: Cardiology | Admitting: Cardiology

## 2021-01-04 DIAGNOSIS — R590 Localized enlarged lymph nodes: Secondary | ICD-10-CM

## 2021-01-04 IMAGING — CT CT CHEST W/O CM
2 of 4 series · 15 of 36 positions shown, 18 images · non-contrast
Comparison: CT abdomen pelvis [DATE] and CT chest [DATE].

CLINICAL DATA: Lymphadenopathy.

EXAM:
CT CHEST WITHOUT CONTRAST
TECHNIQUE: Multidetector CT imaging of the chest was performed following the
standard protocol without IV contrast.

[Series 2: routine chest without · axial · non-contrast · 0.98mm/px · z∈[-40,+268]mm · 12 of 182 slices shown, 15 images]
[im 14/182  mediastinal]
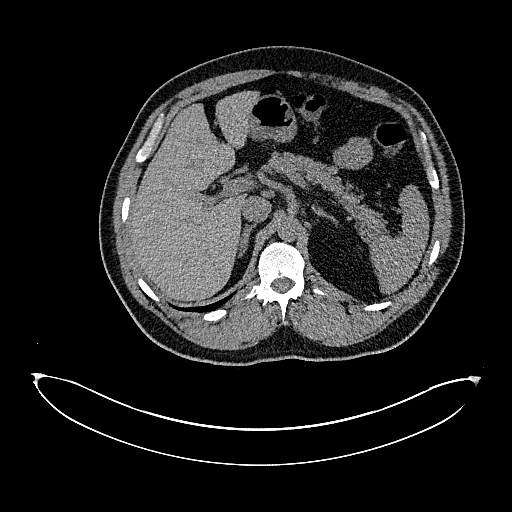
[im 14/182  lung]
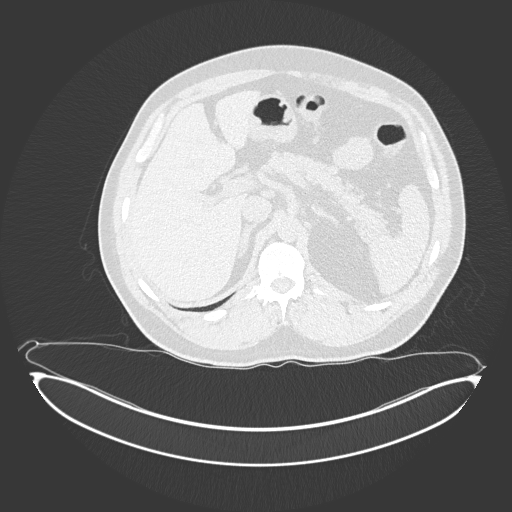
[im 28/182  lung]
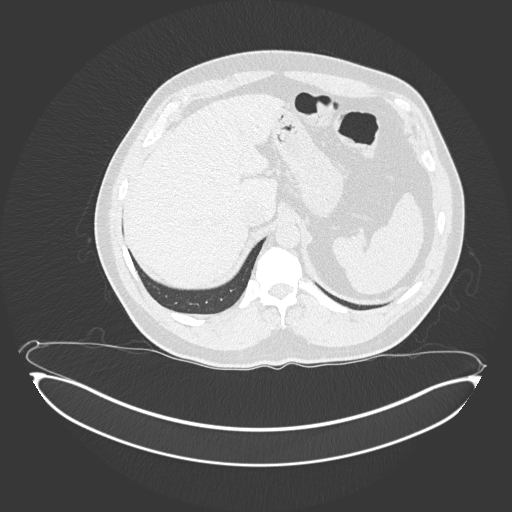
[im 42/182  lung]
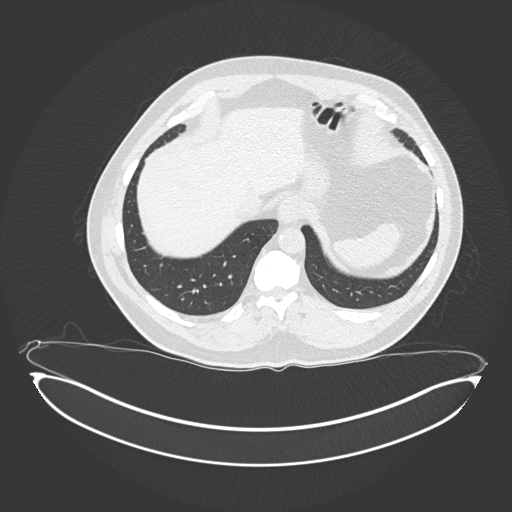
[im 56/182  lung]
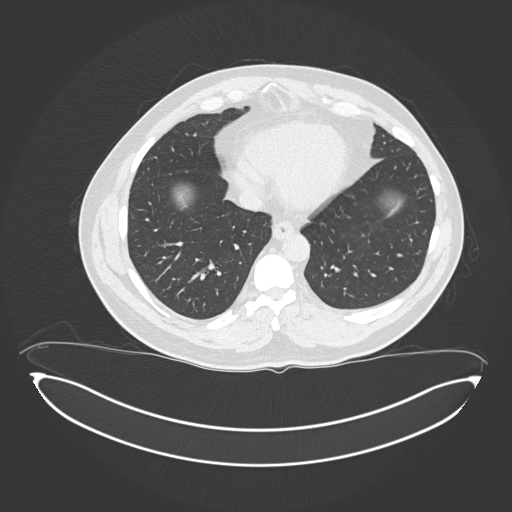
[im 70/182  mediastinal]
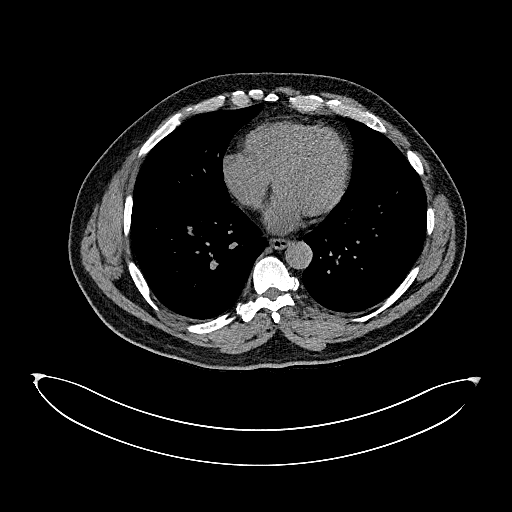
[im 70/182  lung]
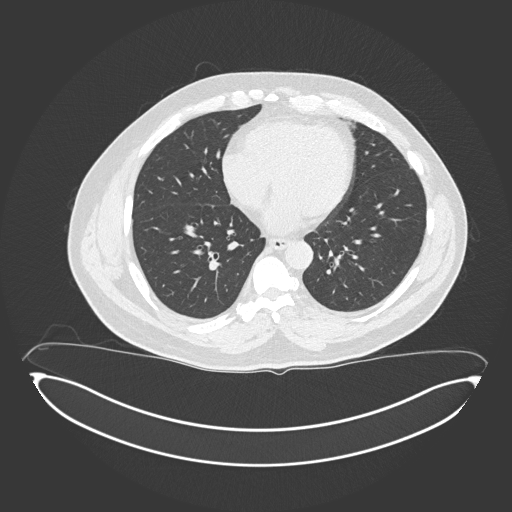
[im 84/182  lung]
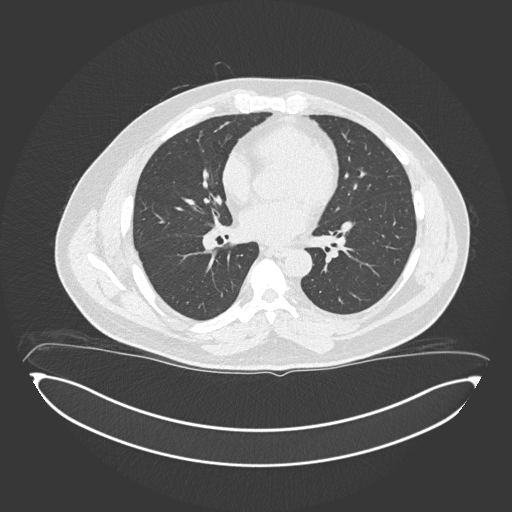
[im 98/182  lung]
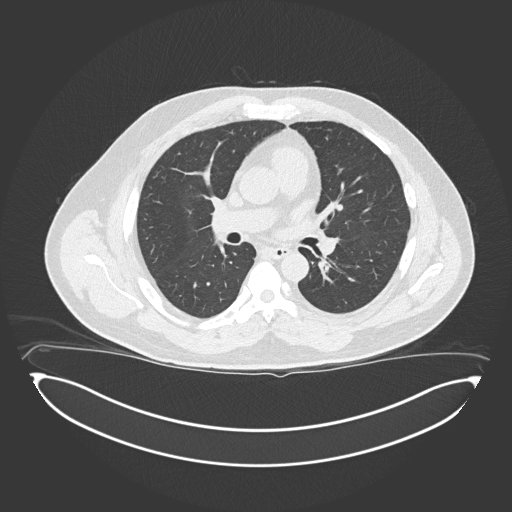
[im 112/182  lung]
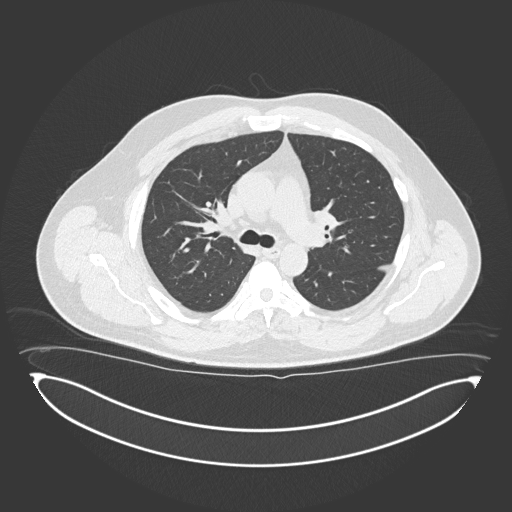
[im 126/182  mediastinal]
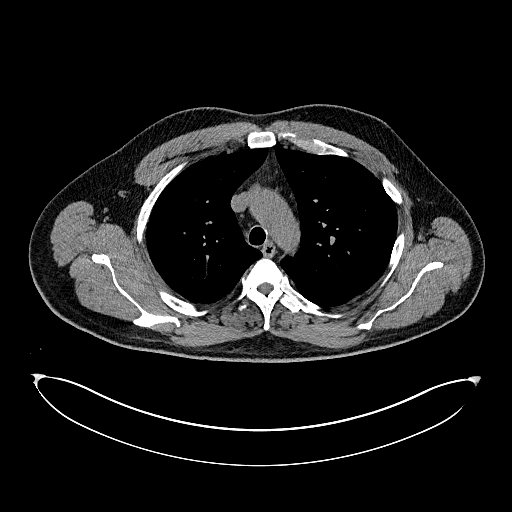
[im 126/182  lung]
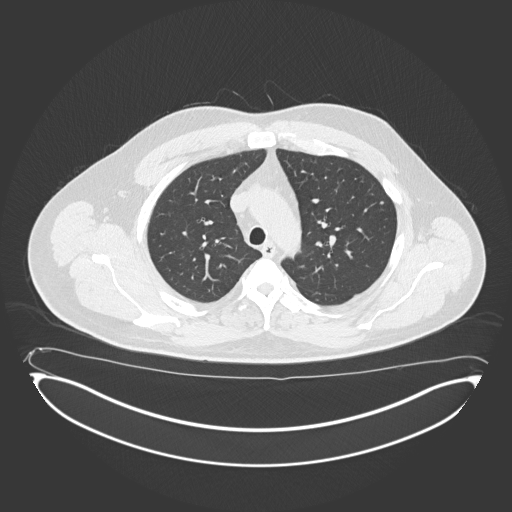
[im 140/182  lung]
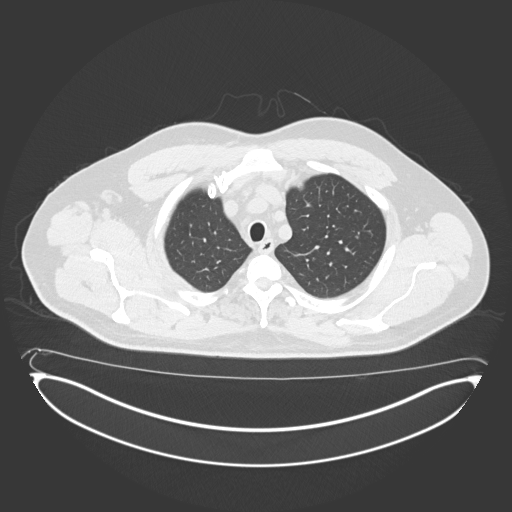
[im 154/182  lung]
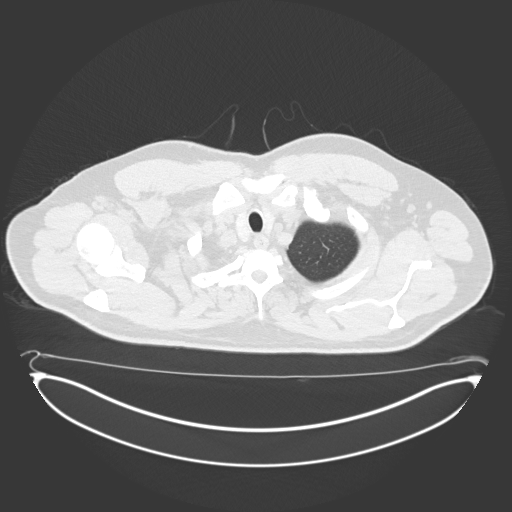
[im 168/182  lung]
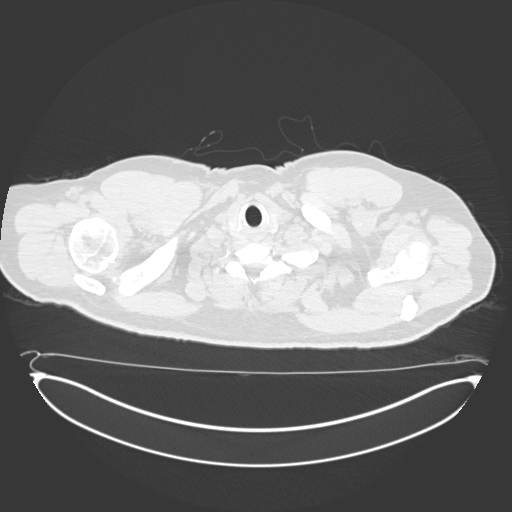

[Series 5: coronal · coronal · 0.80mm/px · 3 of 187 slices shown]
[im 38/187  lung]
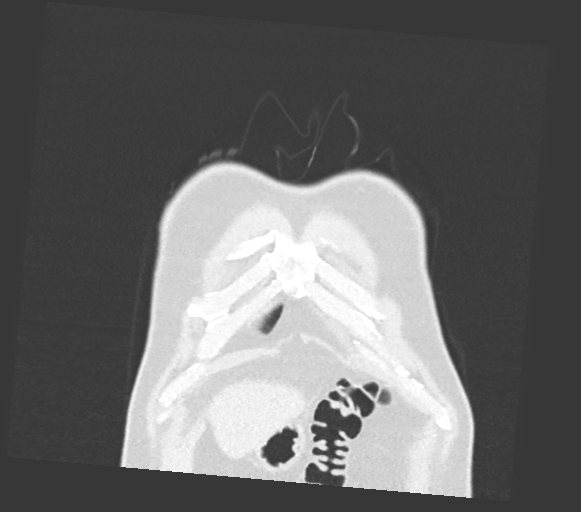
[im 75/187  lung]
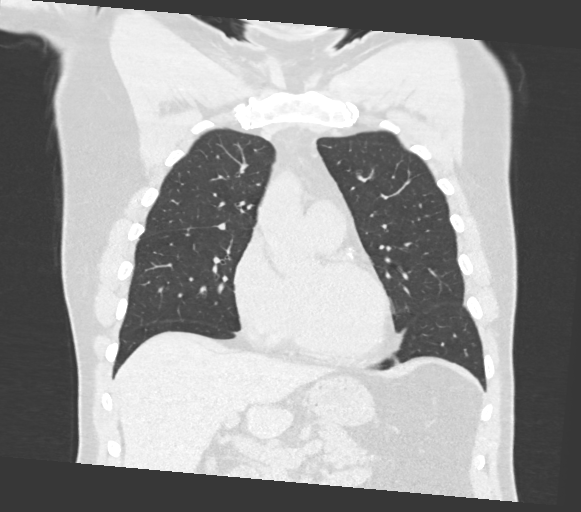
[im 112/187  lung]
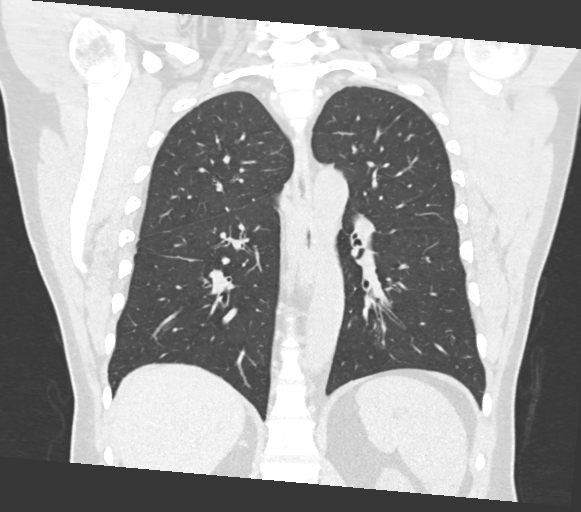

[15 of 36 positions shown; findings below may reference images not displayed]

FINDINGS: Cardiovascular: Atherosclerotic calcification of the aorta and
coronary arteries. Ascending aorta measures at the upper limits of
normal, 3.9 cm. Pulmonic trunk is enlarged. Heart size normal. No
pericardial effusion.

Mediastinum/Nodes: Mediastinal lymph nodes have decreased in size in
the interval and are subcentimeter in short axis size. Hilar regions
are difficult to definitively evaluate without IV contrast. No
axillary adenopathy. Esophagus is grossly unremarkable.

Lungs/Pleura: Pulmonary nodules measure up to 4 mm in the left upper
lobe, unchanged from [DATE]. No new pulmonary nodules. No
pleural fluid. Airway is unremarkable.

Upper Abdomen: Visualized portions of the liver, adrenal glands,
left kidney, spleen, pancreas, stomach and bowel are unremarkable
with the exception of a tiny hiatal hernia. Upper abdominal lymph
nodes are not enlarged by CT size criteria.

Musculoskeletal: None.
IMPRESSION: 1. Residual mediastinal lymph nodes are not enlarged by CT size
criteria.
2. Pulmonary nodules measure 4 mm or less in size and are unchanged
from [DATE]. For patients at increased risk for bronchogenic
carcinoma, 1 year of documented stability is suggested.
3. Aortic atherosclerosis ([SL]-[SL]). Coronary artery
calcification.
4. Enlarged pulmonic trunk, indicative of pulmonary arterial
hypertension.

## 2021-04-06 ENCOUNTER — Other Ambulatory Visit: Payer: Self-pay | Admitting: Cardiology

## 2021-05-24 ENCOUNTER — Other Ambulatory Visit: Payer: Self-pay | Admitting: Cardiology

## 2021-07-05 ENCOUNTER — Other Ambulatory Visit: Payer: Self-pay | Admitting: Cardiology

## 2021-07-09 ENCOUNTER — Other Ambulatory Visit: Payer: Self-pay | Admitting: Cardiology

## 2021-07-26 DIAGNOSIS — I5022 Chronic systolic (congestive) heart failure: Secondary | ICD-10-CM | POA: Insufficient documentation

## 2021-07-26 NOTE — Progress Notes (Deleted)
Cardiology Office Note   Date:  07/26/2021   ID:  Ricardo Evans, DOB 1981/02/26, MRN [id number]  PCP:  [name], MD  Cardiologist:   Rollene Rotunda, MD   No chief complaint on file.    History of Present Illness: Ricardo Evans is a 40 y.o. male who is referred from the hospital with HTN and acute systolic and diastolic heart failure.  He was in the hospital in August.   He presented with SOB and hypertensive urgency.  Echo EF was 45 - 50% with severe left ventricular hypertrophy.  He was sent home in increased dose of Coreg and amlodipine.  Of note there was no evidence of RAS on CT.  He was not seen by cardiology during that visit.   In the hospital he was noted to have a BNP of 265.  Potassium initially was low at 3.1.  He did have some evidence of vascular prominence suggestive of volume overload on his chest x-ray.  He was treated with IV labetalol.  An MRA with no evidence of renal artery stenosis.  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  There was a mention of aortic atherosclerosis.  MRA demonstrated widely patent renal arteries.  There were 2 on the right and a single on the left.  He returns for follow up. ***   ***   Since I last saw him he has done very well.  His blood pressure in the 120s over 70s.  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  He says his fatigue is better.  He is not having any shortness of breath that he was having.  He had some abdominal issues and recently had a CT abdomen pelvis from found to have any diverticulitis the main consideration.  He did show me his blood work and his creatinine has been elevated 1.8 and this is being followed by [name], MD    Past Medical History:  Diagnosis Date   Hypertension    Hypertensive heart disease    Systolic HF (heart failure) (HCC)     Past Surgical History:  Procedure Laterality Date   SPIGELIAN HERNIA     TONSILLECTOMY AND ADENOIDECTOMY       Current  Outpatient Medications  Medication Sig Dispense Refill   acetaminophen (TYLENOL) 325 MG tablet Take 2 tablets (650 mg total) by mouth every 6 (six) hours as needed for mild pain (or Fever >/= 101). 12 tablet 0   carvedilol (COREG) 12.5 MG tablet Take 1 tablet (12.5 mg total) by mouth 2 (two) times daily with a meal. For BP 180 tablet 3   hydrALAZINE (APRESOLINE) 50 MG tablet TAKE 1 TABLET BY MOUTH THREE TIMES DAILY FOR BLOOD PRESSURE 90 tablet 0   hydrOXYzine (ATARAX/VISTARIL) 25 MG tablet Take 1 tablet (25 mg total) by mouth at bedtime as needed. (Patient not taking: No sig reported) 30 tablet 1   methocarbamol (ROBAXIN) 500 MG tablet Take 500 mg by mouth at bedtime.     Olmesartan-amLODIPine-HCTZ 40-10-25 MG TABS Take 1 tablet by mouth once daily 90 tablet 1   spironolactone (ALDACTONE) 50 MG tablet Take 1 tablet by mouth once daily 90 tablet 3   No current facility-administered medications for this visit.    Allergies:   Cephalexin, Penicillins, and Sulfonamide derivatives    ROS:  Please see the history of present illness.   Otherwise, review of systems are positive for ***.  All other systems are reviewed and negative.    PHYSICAL EXAM: VS:  There were no vitals taken for this visit. , BMI There is no height or weight on file to calculate BMI. GENERAL:  Well appearing NECK:  No jugular venous distention, waveform within normal limits, carotid upstroke brisk and symmetric, no bruits, no thyromegaly LUNGS:  Clear to auscultation bilaterally CHEST:  Unremarkable HEART:  PMI not displaced or sustained,S1 and S2 within normal limits, no S3, no S4, no clicks, no rubs, *** murmurs ABD:  Flat, positive bowel sounds normal in frequency in pitch, no bruits, no rebound, no guarding, no midline pulsatile mass, no hepatomegaly, no splenomegaly EXT:  2 plus pulses throughout, no edema, no cyanosis no clubbing     ***GENERAL:  Well appearing NECK:  No jugular venous distention, waveform  within normal limits, carotid upstroke brisk and symmetric, no bruits, no thyromegaly LUNGS:  Clear to auscultation bilaterally CHEST:  Unremarkable HEART:  PMI not displaced or sustained,S1 and S2 within normal limits, no S3, no S4, no clicks, no rubs, no murmurs ABD:  Flat, positive bowel sounds normal in frequency in pitch, no bruits, no rebound, no guarding, no midline pulsatile mass, no hepatomegaly, no splenomegaly EXT:  2 plus pulses throughout, no edema, no cyanosis no clubbing   EKG:  EKG is ***  ordered today. ***   Recent Labs: 12/03/2020: Creatinine, Ser 1.80    Lipid Panel No results found for: CHOL, TRIG, HDL, CHOLHDL, VLDL, LDLCALC, LDLDIRECT    Wt Readings from Last 3 Encounters:  12/08/20 237 lb (107.5 kg)  08/11/20 220 lb (99.8 kg)  06/30/20 211 lb (95.7 kg)      Other studies Reviewed: Additional studies/ records that were reviewed today include:  *** Review of the above records demonstrates:  Please see elsewhere in the note.     ASSESSMENT AND PLAN:  CHRONIC  SYSTOLIC HF:   ***  I think this was systolic and diastolic dysfunction and is now well controlled on the meds as listed.  He understands salt and fluid restriction.  No change in therapy and I will pursue further imaging as below.  HTN:   His blood pressure is *** controlled.  I will defer to Dr. Dimas Aguas to follow his electrolytes routinely.  I think he is tolerating the meds as listed.   DILATED AORTIC ROOT:     This was mild in March and I will follow this up in two years.  *** He will need follow-up later this year.  I will consider CT angiography versus MRI versus just following with echo.  He will need an echocardiogram to follow-up his ejection fraction and left ventricular hypertrophy.  I will start with that and then based on his renal function consider the next imaging.   CKD:   Creat was  *** 1.8 recently.  He will need close follow-up with this per Dr. Dimas Aguas.  ABNORMAL CT:   He did not  have nodules suspicious for CA and he had no evidence of lymphadenopathy on CT in March.    *** He had lymphadenopathy and other abnormalities noted when he was in the hospital in August.  Suggestion was a 27-month follow-up and I will order a noncontrasted CT of his chest. \    Current medicines are reviewed at length with the patient today.  The patient does not have concerns regarding medicines.  The following changes have been made: ***  Labs/ tests ordered today include: ***  No  orders of the defined types were placed in this encounter.    Disposition:   FU with me in *** months.   Signed, Rollene Rotunda, MD  07/26/2021 8:23 PM    [facility]

## 2021-07-27 ENCOUNTER — Ambulatory Visit: Payer: 59 | Admitting: Cardiology

## 2021-07-27 DIAGNOSIS — I10 Essential (primary) hypertension: Secondary | ICD-10-CM

## 2021-07-27 DIAGNOSIS — I5022 Chronic systolic (congestive) heart failure: Secondary | ICD-10-CM

## 2021-07-27 DIAGNOSIS — N182 Chronic kidney disease, stage 2 (mild): Secondary | ICD-10-CM

## 2021-07-27 DIAGNOSIS — I77810 Thoracic aortic ectasia: Secondary | ICD-10-CM

## 2021-08-05 ENCOUNTER — Other Ambulatory Visit (HOSPITAL_COMMUNITY): Payer: Self-pay | Admitting: Nephrology

## 2021-08-05 ENCOUNTER — Other Ambulatory Visit: Payer: Self-pay | Admitting: Nephrology

## 2021-08-05 DIAGNOSIS — N1832 Chronic kidney disease, stage 3b: Secondary | ICD-10-CM

## 2021-08-05 DIAGNOSIS — N170 Acute kidney failure with tubular necrosis: Secondary | ICD-10-CM

## 2021-08-05 DIAGNOSIS — I129 Hypertensive chronic kidney disease with stage 1 through stage 4 chronic kidney disease, or unspecified chronic kidney disease: Secondary | ICD-10-CM

## 2021-08-19 ENCOUNTER — Other Ambulatory Visit (HOSPITAL_BASED_OUTPATIENT_CLINIC_OR_DEPARTMENT_OTHER): Payer: Self-pay | Admitting: Gastroenterology

## 2021-08-19 ENCOUNTER — Other Ambulatory Visit (HOSPITAL_COMMUNITY): Payer: Self-pay | Admitting: Family Medicine

## 2021-08-19 ENCOUNTER — Other Ambulatory Visit (HOSPITAL_BASED_OUTPATIENT_CLINIC_OR_DEPARTMENT_OTHER): Payer: Self-pay | Admitting: Family Medicine

## 2021-08-19 ENCOUNTER — Other Ambulatory Visit: Payer: Self-pay

## 2021-08-19 ENCOUNTER — Ambulatory Visit (HOSPITAL_COMMUNITY)
Admission: RE | Admit: 2021-08-19 | Discharge: 2021-08-19 | Disposition: A | Discharge status: Home / Self Care | Payer: 59 | Source: Ambulatory Visit | Attending: Nephrology | Admitting: Nephrology

## 2021-08-19 DIAGNOSIS — N170 Acute kidney failure with tubular necrosis: Secondary | ICD-10-CM | POA: Diagnosis present

## 2021-08-19 DIAGNOSIS — I129 Hypertensive chronic kidney disease with stage 1 through stage 4 chronic kidney disease, or unspecified chronic kidney disease: Secondary | ICD-10-CM | POA: Diagnosis present

## 2021-08-19 DIAGNOSIS — M545 Low back pain, unspecified: Secondary | ICD-10-CM

## 2021-08-19 DIAGNOSIS — N1832 Chronic kidney disease, stage 3b: Secondary | ICD-10-CM | POA: Diagnosis not present

## 2021-08-19 IMAGING — US US RENAL
1 series · 14 of 25 positions shown · non-contrast
Comparison: None.

CLINICAL DATA: Chronic kidney disease, stage 3b (HCC)

EXAM:
RENAL / URINARY TRACT ULTRASOUND COMPLETE

[Series 1: us renal · 14 of 48 slices shown]
[im 1/48]
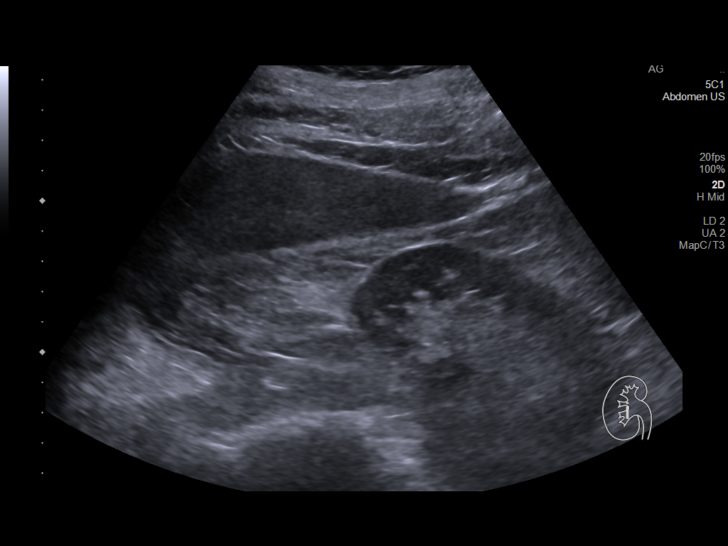
[im 4/48]
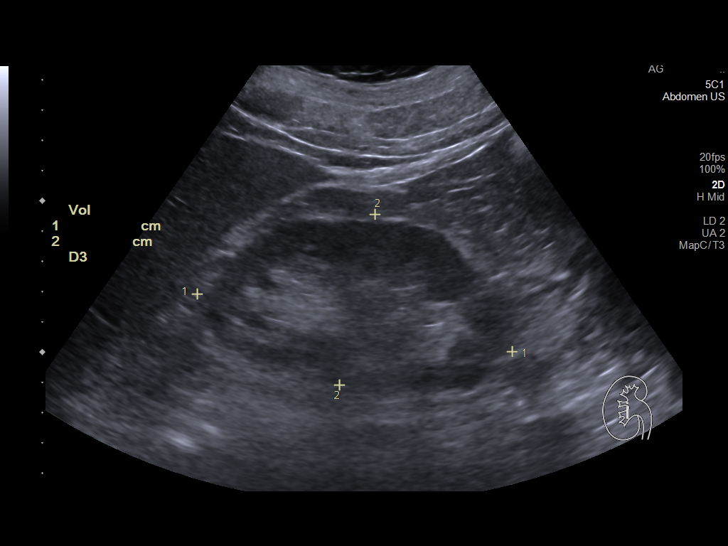
[im 8/48]
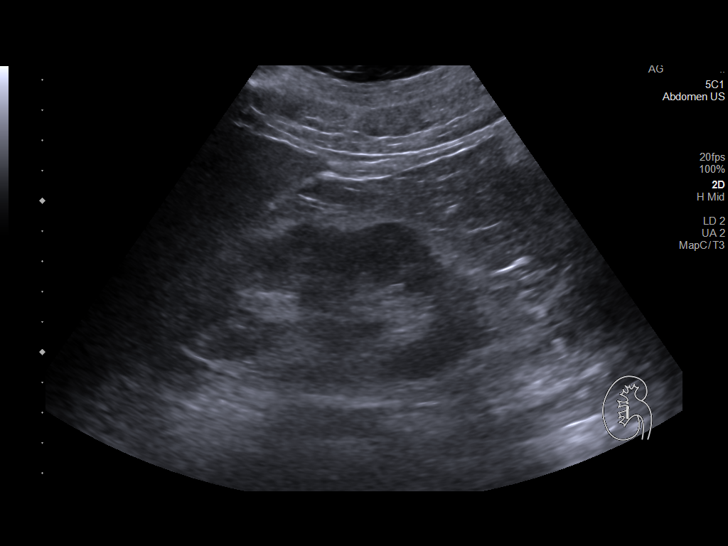
[im 12/48]
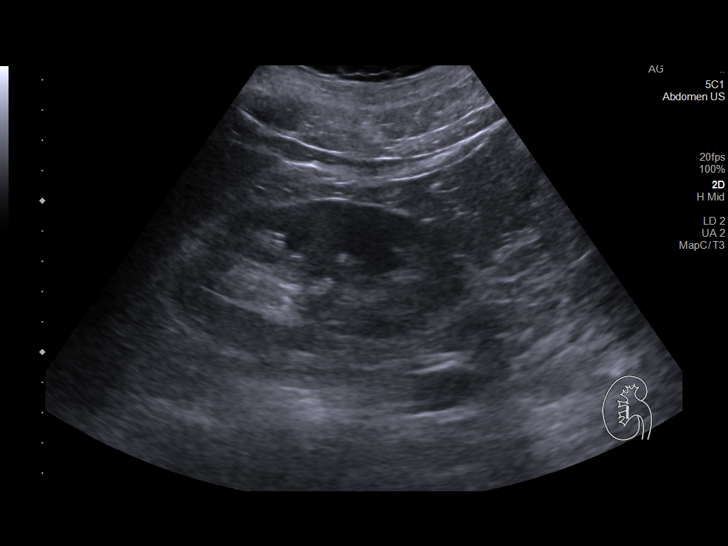
[im 16/48]
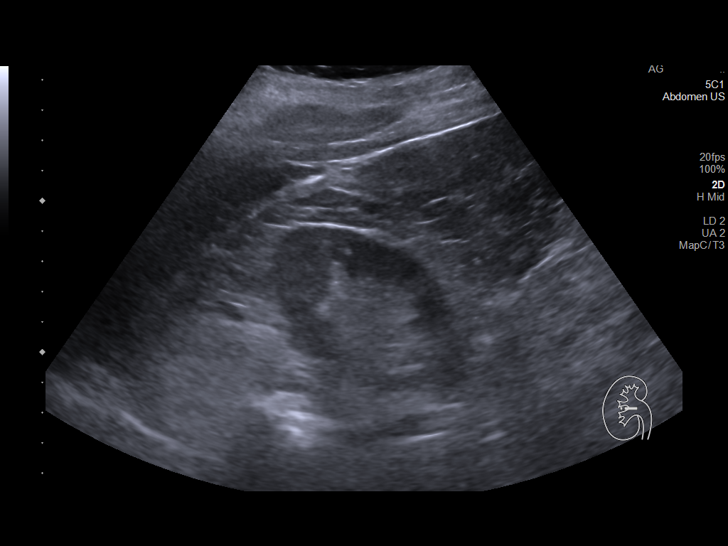
[im 18/48]
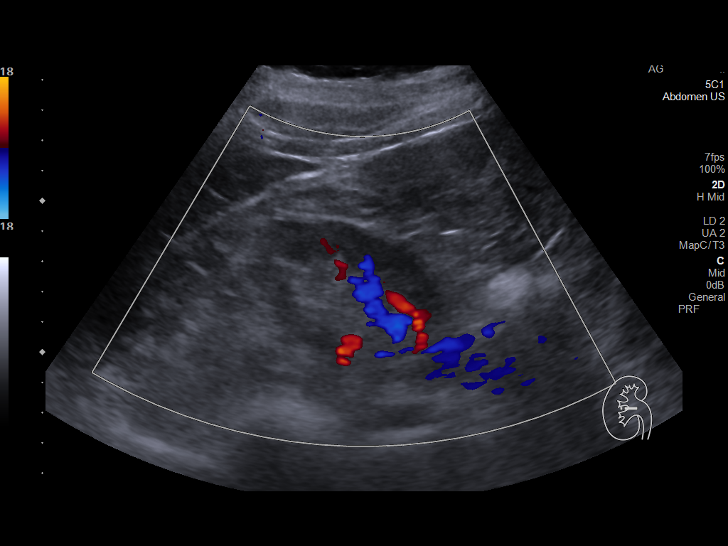
[im 22/48]
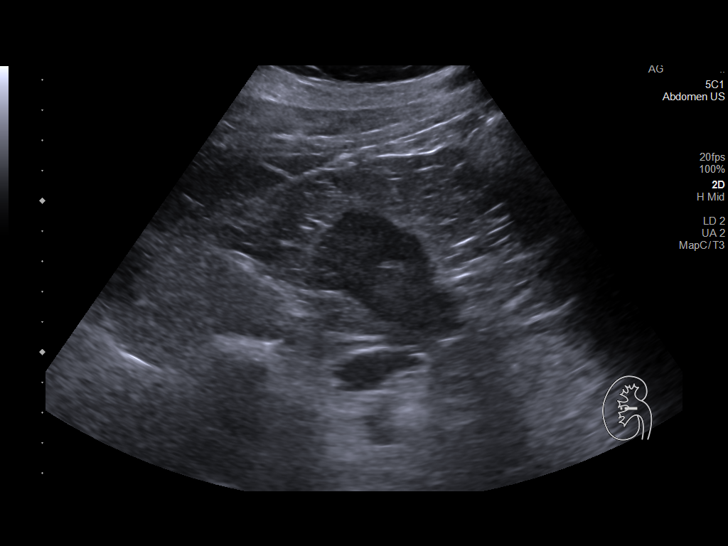
[im 26/48]
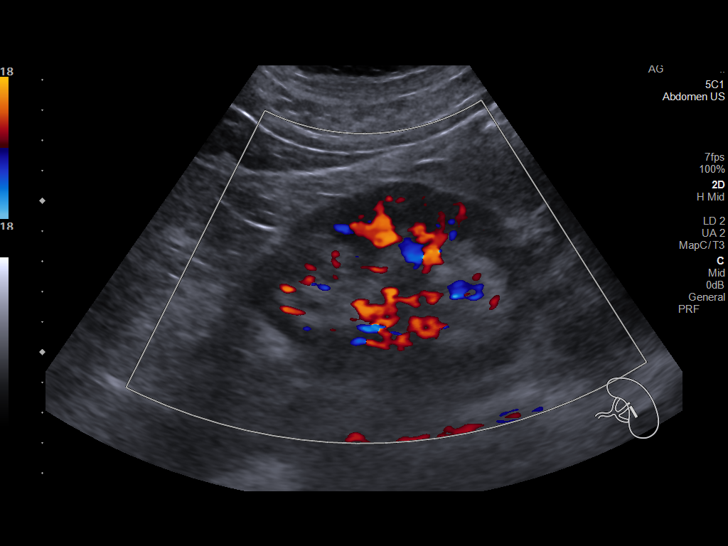
[im 30/48]
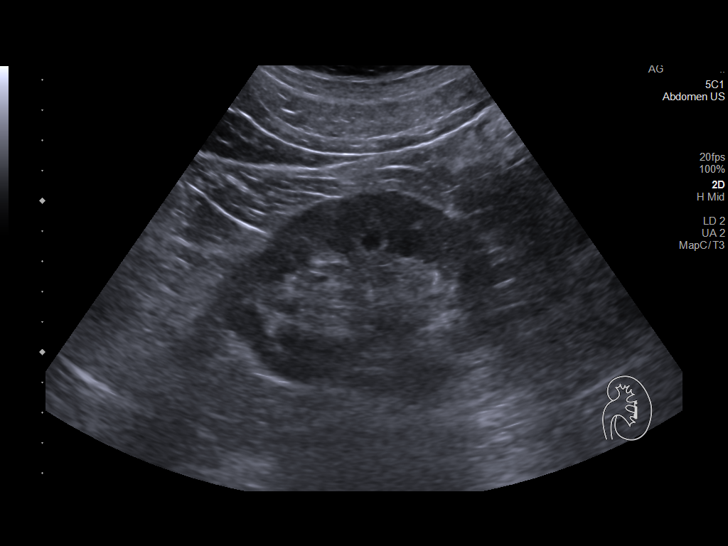
[im 32/48]
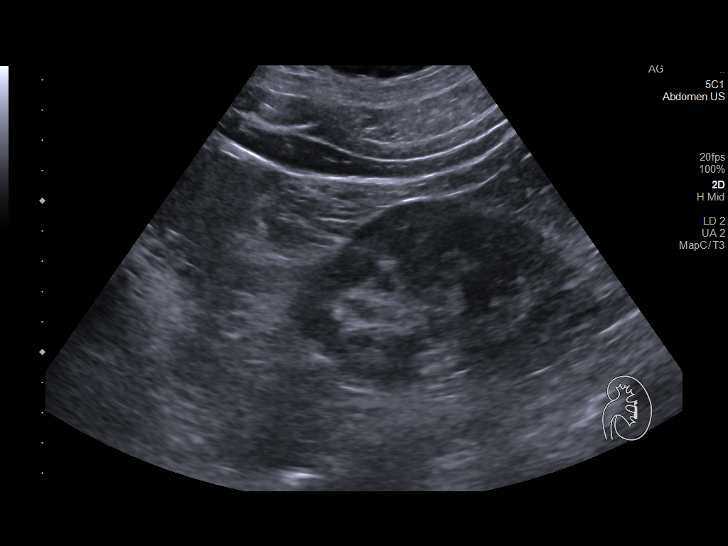
[im 36/48]
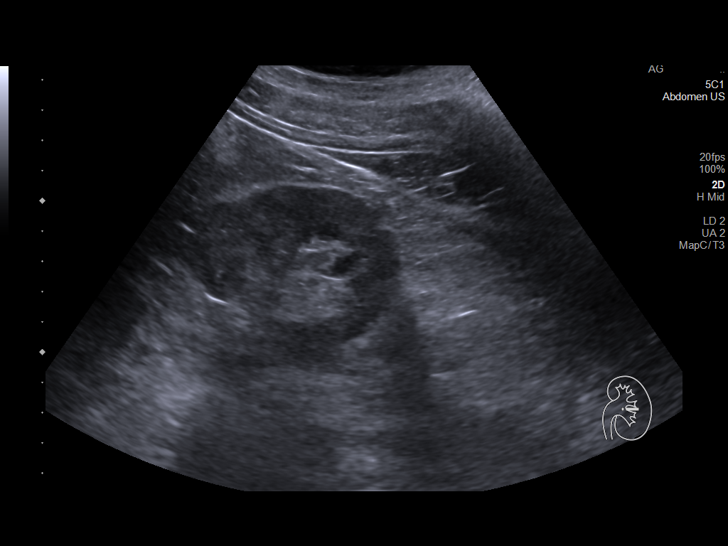
[im 40/48]
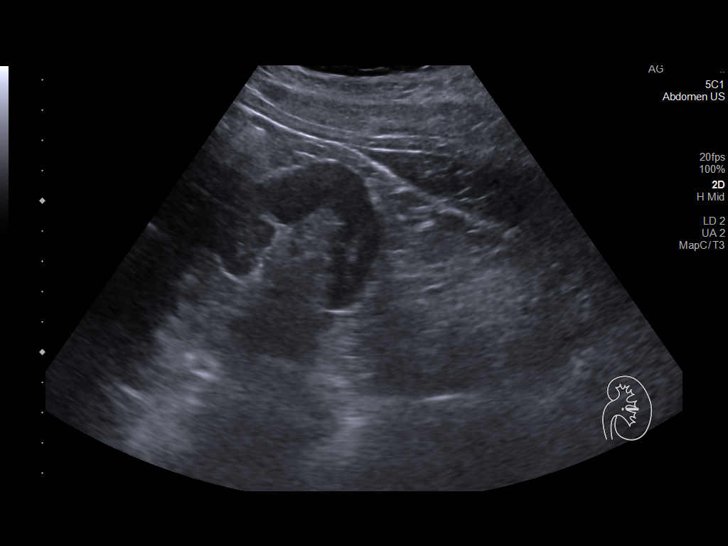
[im 44/48]
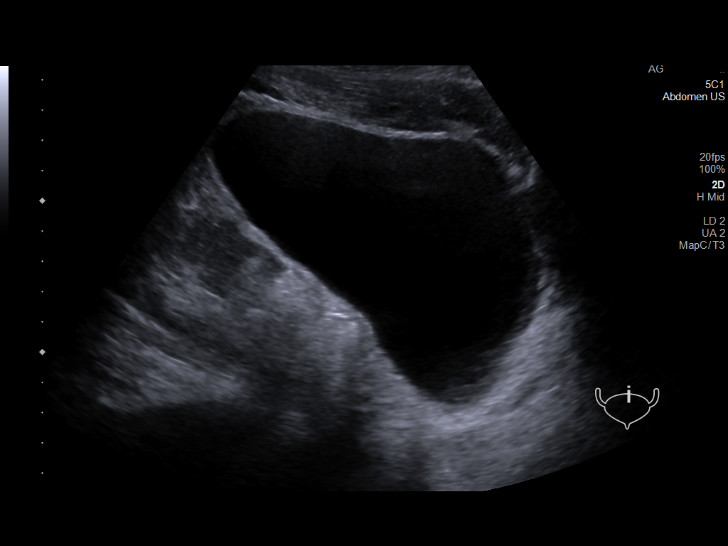
[im 48/48]
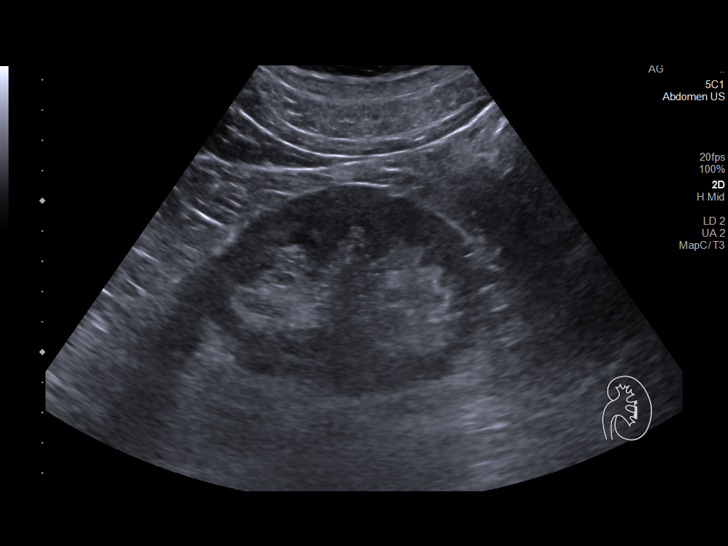

[14 of 25 positions shown; findings below may reference images not displayed]

FINDINGS: Right Kidney:

Renal measurements: 10.6 x 5.8 x 5.6 cm = volume: 177 mL.
Echogenicity within normal limits. No mass or hydronephrosis
visualized.

Left Kidney:

Renal measurements: 10.9 x 6.6 x 5.9 cm = volume: 224 mL.
Echogenicity within normal limits. No mass or hydronephrosis
visualized.

Bladder:

Appears normal for degree of bladder distention. Bilateral ureteral
jets are identified.

Other:

None.
IMPRESSION: Normal renal sonogram

## 2021-08-22 ENCOUNTER — Other Ambulatory Visit: Payer: Self-pay | Admitting: Cardiology

## 2021-08-24 ENCOUNTER — Ambulatory Visit (HOSPITAL_COMMUNITY)
Admission: RE | Admit: 2021-08-24 | Discharge: 2021-08-24 | Disposition: A | Discharge status: Home / Self Care | Payer: 59 | Source: Ambulatory Visit | Attending: Family Medicine | Admitting: Family Medicine

## 2021-08-24 DIAGNOSIS — M545 Low back pain, unspecified: Secondary | ICD-10-CM | POA: Diagnosis present

## 2021-08-24 IMAGING — MR MR LUMBAR SPINE W/O CM
5 of 6 series · 34 of 48 positions shown · non-contrast
Comparison: CT abdomen pelvis [DATE].

CLINICAL DATA: Low back pain no trauma

EXAM:
MRI LUMBAR SPINE WITHOUT CONTRAST
TECHNIQUE: Multiplanar, multisequence MR imaging of the lumbar spine was
performed. No intravenous contrast was administered.

[Series 5: T1 · sagittal · 4.0mm · 0.81mm/px · 4 of 15 slices shown (1 of 2)]
[im 1/15]
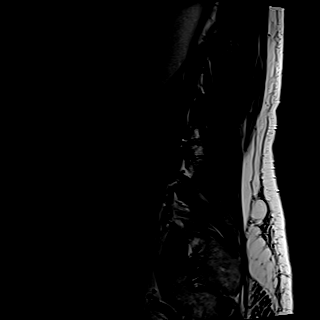
[im 5/15]
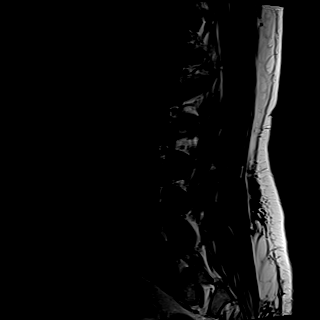
[im 10/15]
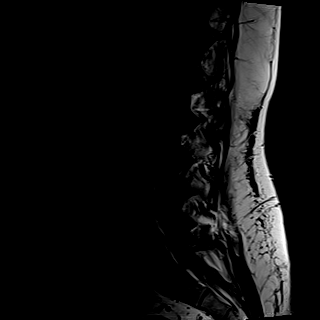
[im 15/15]
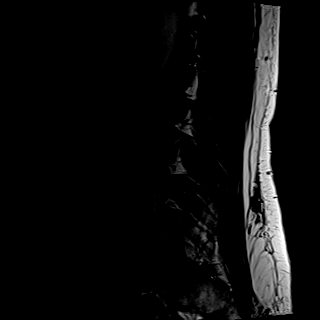

[Series 6: T2 · sagittal · 4.0mm · 0.81mm/px · 4 of 15 slices shown (1 of 3)]
[im 1/15]
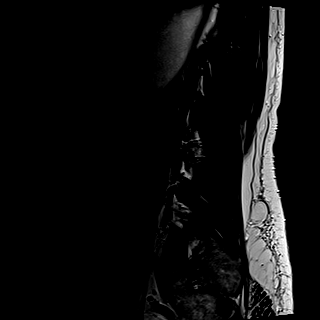
[im 5/15]
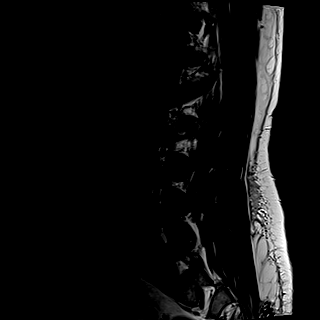
[im 10/15]
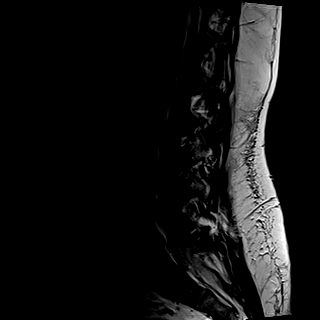
[im 15/15]
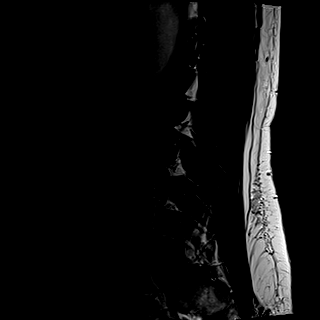

[Series 8: T2 · axial · 4.0mm · 0.62mm/px · z∈[-143,+108]mm · 9 of 48 slices shown (2 of 3)]
[im 1/48]
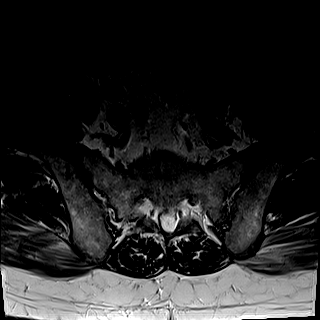
[im 9/48]
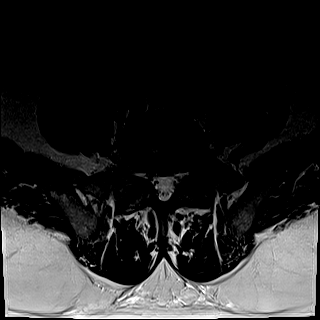
[im 13/48]
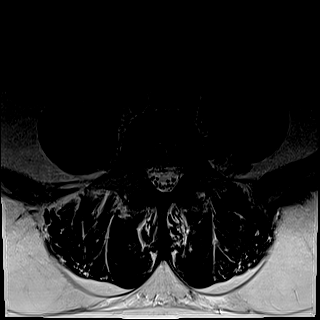
[im 22/48]
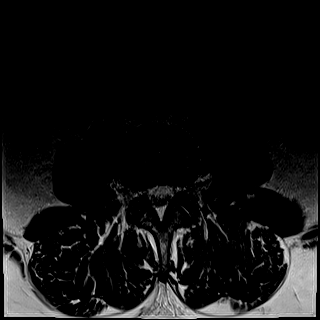
[im 26/48]
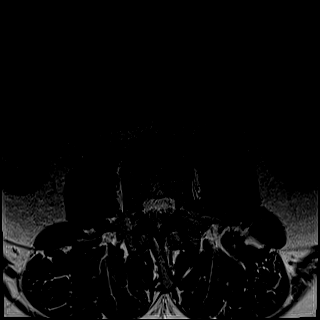
[im 35/48]
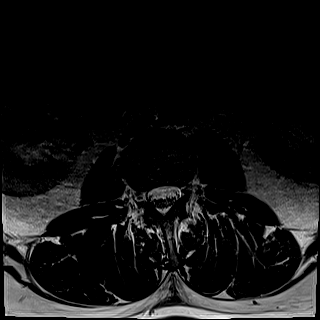
[im 39/48]
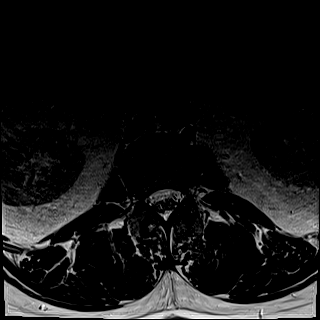
[im 43/48]
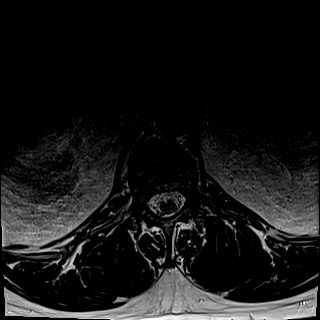
[im 48/48]
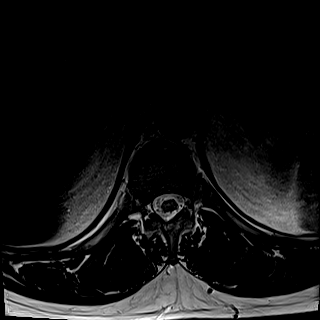

[Series 9: T1 · axial · 4.0mm · 0.39mm/px · z∈[-143,+108]mm · 8 of 48 slices shown (2 of 2)]
[im 1/48]
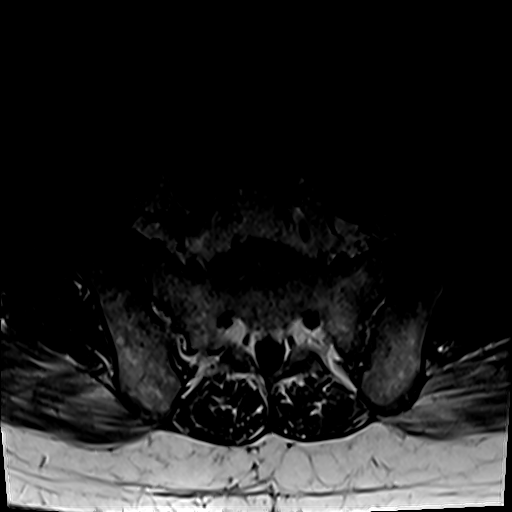
[im 9/48]
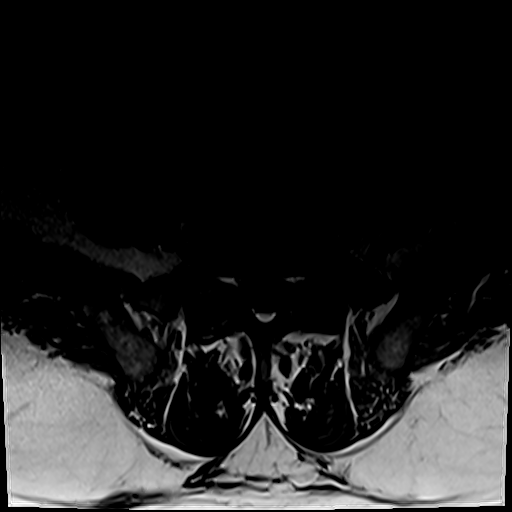
[im 13/48]
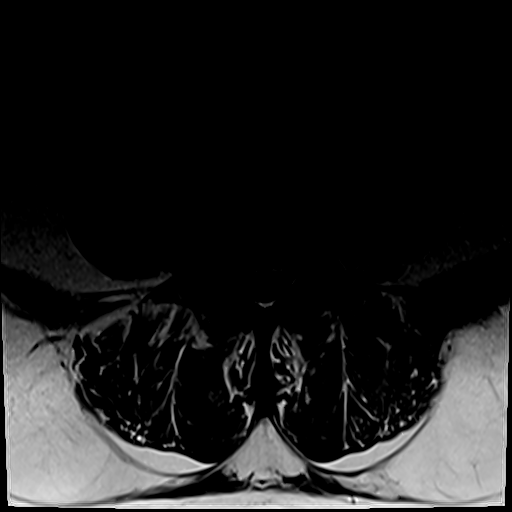
[im 22/48]
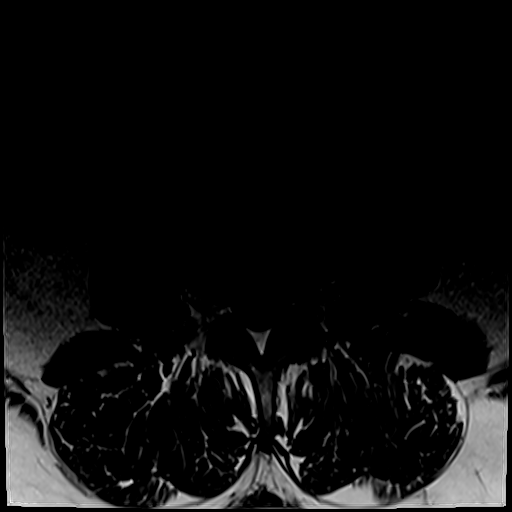
[im 26/48]
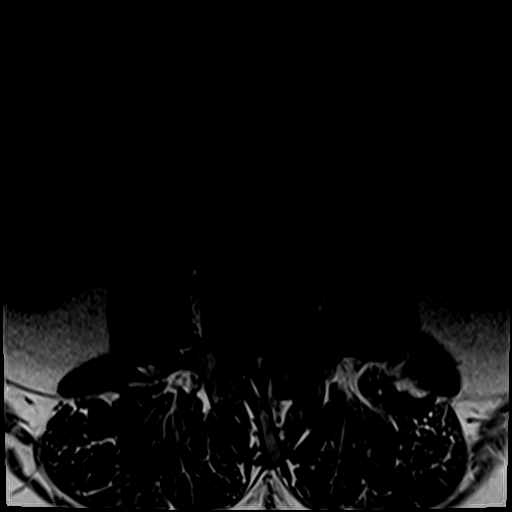
[im 35/48]
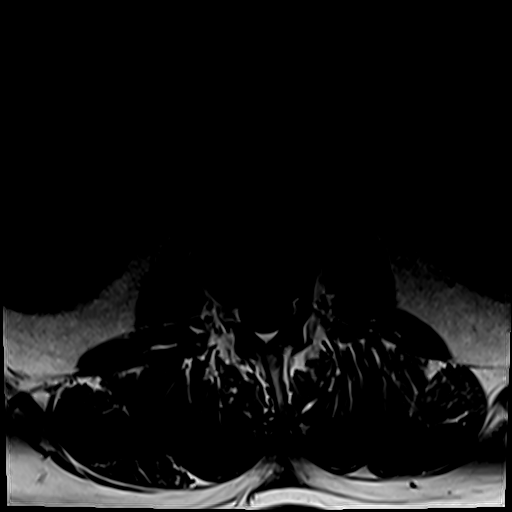
[im 39/48]
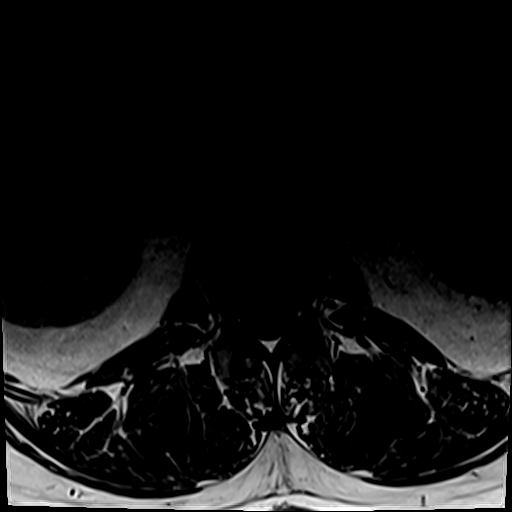
[im 48/48]
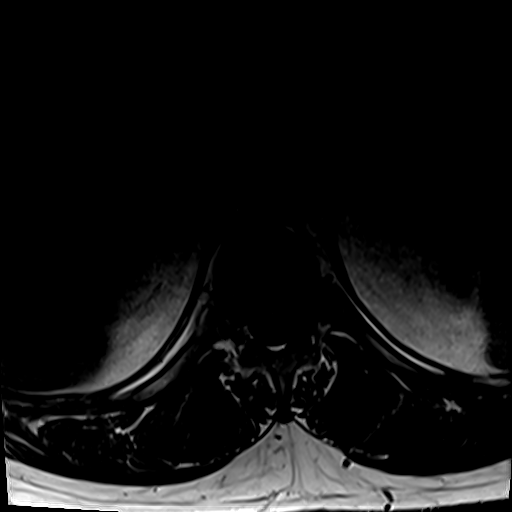

[Series 10: T2 · axial · 4.0mm · 0.62mm/px · z∈[-143,+108]mm · 9 of 48 slices shown (3 of 3)]
[im 1/48]
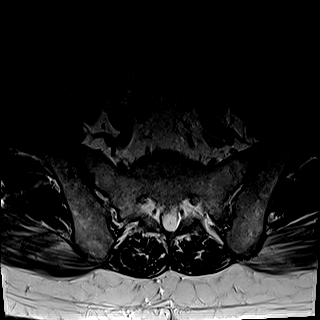
[im 9/48]
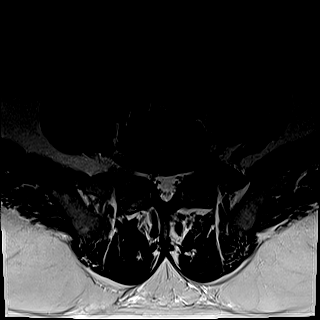
[im 13/48]
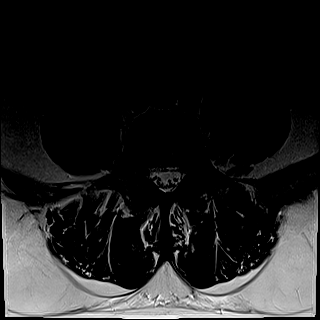
[im 22/48]
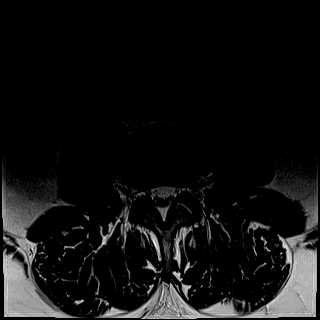
[im 26/48]
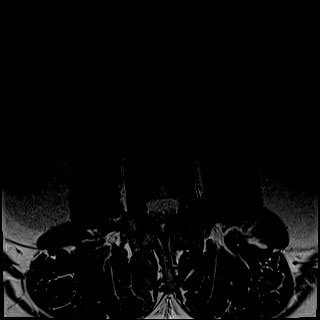
[im 35/48]
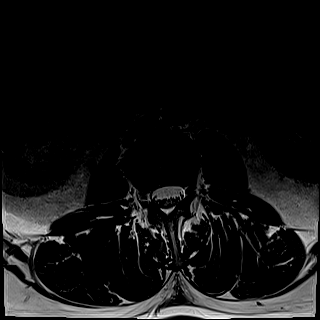
[im 39/48]
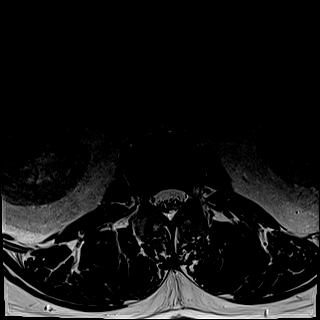
[im 43/48]
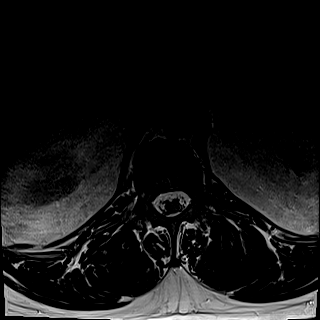
[im 48/48]
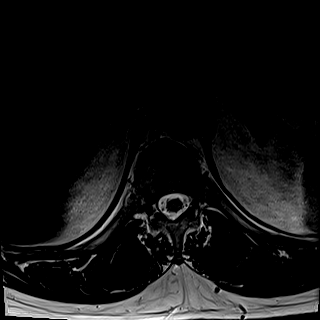

[34 of 48 positions shown; findings below may reference images not displayed]

FINDINGS: Segmentation: Review of the prior CT demonstrates small ribs at T12.
Four lumbar vertebral bodies are present. L5 is incorporated into
the sacrum.

Alignment:  Normal

Vertebrae:  Normal bone marrow.  Negative for fracture or mass.

Conus medullaris and cauda equina: Conus extends to the L1-2 level.
Conus and cauda equina appear normal.

Paraspinal and other soft tissues: Negative for paraspinous mass,
adenopathy, or fluid collection

Disc levels:

L1-2: Negative

L2-3: Negative

L3-4: Mild facet degeneration.  Mild disc bulging without stenosis

L4-5: Mild disc bulging and mild facet degeneration. Negative for
stenosis.

L5-S1: Hypoplastic disc space.
IMPRESSION: Small ribs at T12.  L5 incorporated into the sacrum

Mild lumbar degenerative change without neural impingement or
stenosis.

## 2021-08-26 ENCOUNTER — Other Ambulatory Visit (HOSPITAL_BASED_OUTPATIENT_CLINIC_OR_DEPARTMENT_OTHER): Payer: Self-pay

## 2021-08-26 DIAGNOSIS — I10 Essential (primary) hypertension: Secondary | ICD-10-CM

## 2021-08-26 DIAGNOSIS — E669 Obesity, unspecified: Secondary | ICD-10-CM

## 2021-09-16 ENCOUNTER — Other Ambulatory Visit: Payer: Self-pay

## 2021-09-16 ENCOUNTER — Ambulatory Visit: Payer: 59

## 2021-09-16 DIAGNOSIS — I10 Essential (primary) hypertension: Secondary | ICD-10-CM

## 2021-09-16 DIAGNOSIS — E669 Obesity, unspecified: Secondary | ICD-10-CM

## 2021-09-20 ENCOUNTER — Other Ambulatory Visit: Payer: Self-pay | Admitting: Cardiology

## 2021-09-29 ENCOUNTER — Other Ambulatory Visit: Payer: Self-pay | Admitting: Cardiology

## 2021-10-09 ENCOUNTER — Ambulatory Visit: Payer: 59 | Attending: Nephrology | Admitting: Neurology

## 2021-10-09 ENCOUNTER — Other Ambulatory Visit: Payer: Self-pay

## 2021-10-09 DIAGNOSIS — E669 Obesity, unspecified: Secondary | ICD-10-CM | POA: Insufficient documentation

## 2021-10-09 DIAGNOSIS — Z6832 Body mass index (BMI) 32.0-32.9, adult: Secondary | ICD-10-CM | POA: Insufficient documentation

## 2021-10-09 DIAGNOSIS — I10 Essential (primary) hypertension: Secondary | ICD-10-CM | POA: Diagnosis not present

## 2021-10-09 DIAGNOSIS — R0683 Snoring: Secondary | ICD-10-CM | POA: Diagnosis not present

## 2021-10-21 NOTE — Procedures (Signed)
°  [facility] [name] A. Gerilyn Pilgrim, MD     [email or web address]             HOME SLEEP STUDY  LOCATION: [facility] Name: Ricardo Evans, Ricardo Evans Date: 10/09/2021 Gender: Male D.O.B: 1981/09/04 Age (years): 78 Referring Provider: Manpreet Bhutani Height (inches): 72 Interpreting Physician: Beryle Beams MD, ABSM Weight (lbs): 237 RPSGT: Alfonso Ellis BMI: 32 MRN: [id number] Neck Size: CLINICAL INFORMATION Sleep Study Type: HST     Indication for sleep study: N/A     Epworth Sleepiness Score: N/A  SLEEP STUDY TECHNIQUE A multi-channel overnight portable sleep study was performed. The channels recorded were: nasal airflow, thoracic respiratory movement, and oxygen saturation with a pulse oximetry. Snoring was also monitored.  MEDICATIONS Patient self administered medications include: N/A.  Current Outpatient Medications:    acetaminophen (TYLENOL) 325 MG tablet, Take 2 tablets (650 mg total) by mouth every 6 (six) hours as needed for mild pain (or Fever >/= 101)., Disp: 12 tablet, Rfl: 0   carvedilol (COREG) 12.5 MG tablet, TAKE 1 TABLET BY MOUTH TWICE DAILY WITH A MEAL FOR BLOOD PRESSURE, Disp: 180 tablet, Rfl: 1   hydrALAZINE (APRESOLINE) 50 MG tablet, TAKE 1 TABLET BY MOUTH THREE TIMES DAILY FOR BLOOD PRESSURE, Disp: 90 tablet, Rfl: 0   hydrOXYzine (ATARAX/VISTARIL) 25 MG tablet, Take 1 tablet (25 mg total) by mouth at bedtime as needed. (Patient not taking: No sig reported), Disp: 30 tablet, Rfl: 1   methocarbamol (ROBAXIN) 500 MG tablet, Take 500 mg by mouth at bedtime., Disp: , Rfl:    Olmesartan-amLODIPine-HCTZ 40-10-25 MG TABS, Take 1 tablet by mouth once daily, Disp: 90 tablet, Rfl: 1   spironolactone (ALDACTONE) 50 MG tablet, Take 1 tablet by mouth once daily, Disp: 90 tablet, Rfl: 3   SLEEP ARCHITECTURE Patient was studied for 474.4 minutes. The sleep efficiency was 98.9 % and the patient was supine for 25.4%. The arousal index was 0.0 per  hour.  RESPIRATORY PARAMETERS The overall AHI was 2.5 per hour, with a central apnea index of 0 per hour.  The oxygen nadir was 78% during sleep. The total minutes with oxygen saturation less than 88% is 71 minutes.     CARDIAC DATA Mean heart rate during sleep was 75.2 bpm.  IMPRESSIONS Persistent desaturation without apneic events indicating hypoventilation syndrome during sleep. Two liters nocturnal oxygen are recommended.  2.   No significant obstructive sleep apnea occurred during this study (AHI = 2.5/h).   Argie Ramming, MD Diplomate, American Board of Sleep Medicine. ELECTRONICALLY SIGNED ON:  10/21/2021, 5:10 PM [facility] PH: [phone]   FX: [phone] ACCREDITED BY THE AMERICAN ACADEMY OF SLEEP MEDICINE

## 2021-11-21 ENCOUNTER — Other Ambulatory Visit: Payer: Self-pay | Admitting: Cardiology

## 2021-12-12 ENCOUNTER — Other Ambulatory Visit: Payer: Self-pay | Admitting: Cardiology

## 2022-01-19 ENCOUNTER — Other Ambulatory Visit: Payer: Self-pay | Admitting: Cardiology

## 2022-02-22 ENCOUNTER — Encounter: Payer: Self-pay | Admitting: Pulmonary Disease

## 2022-02-22 ENCOUNTER — Ambulatory Visit (INDEPENDENT_AMBULATORY_CARE_PROVIDER_SITE_OTHER): Payer: Commercial Managed Care - PPO | Admitting: Pulmonary Disease

## 2022-02-22 VITALS — BP 124/76 | HR 90 | Ht 72.0 in | Wt 254.0 lb

## 2022-02-22 DIAGNOSIS — G4734 Idiopathic sleep related nonobstructive alveolar hypoventilation: Secondary | ICD-10-CM

## 2022-02-22 DIAGNOSIS — I5042 Chronic combined systolic (congestive) and diastolic (congestive) heart failure: Secondary | ICD-10-CM | POA: Diagnosis not present

## 2022-02-22 NOTE — Patient Instructions (Signed)
Will arrange for overnight oxygen test on room air ? ?Will see if we can arrange for a new cardiologist  ? ?Follow up in 2 months ?

## 2022-02-22 NOTE — Progress Notes (Signed)
? ?[facility] ? ?Chief Complaint  ?Patient presents with  ? Consult  ?  Referred by kidney doctor Dr. Wolfgang Phoenix for nocturnal hypoxemia. Has had a sleep study before and confirmed he doesn't have OSA but had low oxygen. Not using oxygen.   ? ? ?Past Surgical History:  ?He  has a past surgical history that includes Spigelian hernia and Tonsillectomy and adenoidectomy. ? ?Past Medical History:  ?Hypertension, Systolic CHF ? ?Constitutional:  ?BP 124/76   Pulse 90   Ht 6' (1.829 m)   Wt 254 lb (115.2 kg)   SpO2 100% Comment: on RA  BMI 34.45 kg/m?  ? ?Brief Summary:  ?Ricardo Evans is a 41 y.o. male with nocturnal hypoxemia. ?  ? ? ? ?Subjective:  ? ?He was seen by nephrology for CKD from hypertension.  Concerned he could have sleep apnea.  He has home sleep study in December.  No significant sleep apnea, but he did have low oxygen level. ? ?He wasn't sure why he needed a sleep study.  He doesn't think he has a sleep issue.  He quit smoking years ago, but had second hand exposure from his parents.  He does not have cough, wheeze, or chest congestion.  Gets ankle swelling intermittently.  No history of pneumonia, asthma, COPD, thromboembolic disease, liver disease, or pneumonia.   ? ?He was noted to have mediastinal lymphadenopathy on CT chest from August 2021 concerning for sarcoidosis.  Follow up CT chest from March 2022 showed resolution of adenopathy. ? ?He was seen in hospital in August 2021 by Dr. Sherene Sires for hypoxia and needed supplemental oxygen while in hospital.  Felt to be related to URI, and acute on chronic CHF. ? ?Physical Exam:  ? ?Appearance - well kempt  ? ?ENMT - no sinus tenderness, no oral exudate, no LAN, Mallampati 3 airway, no stridor ? ?Respiratory - equal breath sounds bilaterally, no wheezing or rales ? ?CV - s1s2 regular rate and rhythm, no murmurs ? ?Ext - no clubbing, no edema ? ?Skin - no rashes ? ?Psych - normal mood and affect ?  ?Pulmonary  testing:  ? ? ?Chest Imaging:  ?CT angio chest 05/30/20 >> atherosclerosis, 4.2 cm ascending thoracic aorta, PA 3.6 cm diameter, mediastinal LAN up to 1.6 cm, bronchial wall thickening, mild mosaic attenuation, 4 mm nodule LUL ?CT chest 01/05/21 >> decreased size of mediastinal LN, stable 4 mm nodule LUL, enlarged PA ? ?Sleep Tests:  ?HST 10/11/21 >> AHI 2.5, SpO2 low 78%; Spent 71.3 min with an SpO2 < 88% ? ?Cardiac Tests:  ?Echo 06/01/20 >> EF 45 to 50%, severe LVH, mod LA dilation ? ?Social History:  ?He  reports that he has never smoked. He has never used smokeless tobacco. He reports current alcohol use. He reports that he does not use drugs. ? ?Family History:  ?His family history includes COPD in his father; Heart disease in his maternal grandmother; Suicidality in his mother. ?  ? ?Discussion:  ?He had home sleep study that was negative for sleep apnea, but was found to have low oxygen during the study.  He does not have any significant pulmonary findings otherwise.  Uncertain significance from home sleep study findings. ? ?Assessment/Plan:  ? ?Nocturnal hypoxemia. ?- will arrange for overnight oximetry on room air ?- if he does still have low oxygen at night, would then need additional testing: in lab sleep study, chest xray, PFT, and possibly ABG ?- he did have enlarged  pulmonary artery on previous imaging studies, and might need f/u echocardiogram to assess for pulmonary hypertension ? ?CKD 3a. ?- followed by Dr. Celso Amy with [facility] ? ?Chronic combined CHF, Aortic root dilation, Hypertension. ?- explained he needs continued cardiology follow up ?- he would like to be seen by a new cardiologist ?- will see if he can switch to Dr. Wyline Mood or Dr. Diona Browner in [place] ? ?Time Spent Involved in Patient Care on Day of Examination:  ?52 minutes ? ?Follow up:  ? ?Patient Instructions  ?Will arrange for overnight oxygen test on room air ? ?Will see if we can arrange for a new cardiologist   ? ?Follow up in 2 months ? ?Medication List:  ? ?Allergies as of 02/22/2022   ? ?   Reactions  ? Cephalexin   ? Penicillins   ? Sulfonamide Derivatives   ? ?  ? ?  ?Medication List  ?  ? ?  ? Accurate as of Feb 22, 2022  4:18 PM. If you have any questions, ask your nurse or doctor.  ?  ?  ? ?  ? ?STOP taking these medications   ? ?hydrOXYzine 25 MG tablet ?Commonly known as: ATARAX ?Stopped by: Coralyn Helling, MD ?  ? ?  ? ?TAKE these medications   ? ?acetaminophen 325 MG tablet ?Commonly known as: TYLENOL ?Take 2 tablets (650 mg total) by mouth every 6 (six) hours as needed for mild pain (or Fever >/= 101). ?  ?carvedilol 12.5 MG tablet ?Commonly known as: COREG ?TAKE 1 TABLET BY MOUTH TWICE DAILY WITH A MEAL FOR BLOOD PRESSURE ?  ?hydrALAZINE 50 MG tablet ?Commonly known as: APRESOLINE ?TAKE 1 TABLET BY MOUTH THREE TIMES DAILY FOR BLOOD PRESSURE. APPOINTMENT REQUIRED FOR FUTURE REFILLS ?  ?methocarbamol 500 MG tablet ?Commonly known as: ROBAXIN ?Take 500 mg by mouth at bedtime. ?  ?Olmesartan-amLODIPine-HCTZ 40-10-25 MG Tabs ?Take 1 tablet by mouth once daily ?  ?spironolactone 50 MG tablet ?Commonly known as: ALDACTONE ?Take 1 tablet (50 mg total) by mouth daily. Please schedule appt for future refills. 1st attempt ?  ? ?  ? ? ?Signature:  ?Coralyn Helling, MD ?[facility] ?Pager - [phone] - [phone] ?02/22/2022, 4:18 PM ?  ? ? ? ? ? ? ? ? ?

## 2022-03-02 ENCOUNTER — Other Ambulatory Visit: Payer: Self-pay | Admitting: Cardiology

## 2022-03-10 ENCOUNTER — Telehealth: Payer: Self-pay | Admitting: Pulmonary Disease

## 2022-03-10 DIAGNOSIS — G4734 Idiopathic sleep related nonobstructive alveolar hypoventilation: Secondary | ICD-10-CM

## 2022-03-10 NOTE — Telephone Encounter (Signed)
ONO with RA 02/28/22 >> test time 6 hrs 42 min.  Baseline SpO2 92%, low SpO2 81%.  Spent 17 min 14 sec with an SpO2 < 88%.   Please let him know his overnight oxygen test shows his oxygen level is still low at night.  He needs to be set up with 2 liters oxygen at night.

## 2022-03-13 NOTE — Progress Notes (Unsigned)
Office Visit    Patient Name: Ricardo Evans Date of Encounter: 03/14/2022  Primary Care Provider:  [name], MD Primary Cardiologist:  Ricardo Rotunda, MD  Chief Complaint    41 year old male with a history of chronic combined systolic and diastolic heart failure, hypertension, aortic root dilation, and CKD who presents for follow-up related to heart failure.  Past Medical History    Past Medical History:  Diagnosis Date   Hypertension    Hypertensive heart disease    Systolic HF (heart failure) (HCC)    Past Surgical History:  Procedure Laterality Date   SPIGELIAN HERNIA     TONSILLECTOMY AND ADENOIDECTOMY      Allergies  Allergies  Allergen Reactions   Cephalexin    Penicillins    Sulfonamide Derivatives     History of Present Illness    41 year old male with the above past medical history including chronic combined systolic and diastolic heart failure, hypertension, aortic root dilation, and CKD.   He was hospitalized in August 2021 with shortness of breath and hypertensive urgency.  Echo showed EF 45 to 50%, severe LVH.  He was started on carvedilol and amlodipine.  Of note, there was no evidence of RAS on CT abdomen/pelvis/MRA abdomen.  CT angio of the chest showed a 4.2 cm ascending thoracic aorta.  He was not seen by cardiology during that visit.  He was last seen in the office on 12/08/2020 and was stable from a cardiac standpoint.  BP was well controlled.  Follow-up CT of chest in March 2022 showed ascending aorta measuring 3.9 cm nodules, lymphadenopathy.  Repeat CT was recommended in 2 years.  He presents today for follow-up.  Since his last visit  Chronic combined systolic and diastolic heart failure:  Echo in 05/2020 showed EF 45 to 50%, severe LVH.   Repeat echocardiogram Hypertension: BP well controlled. Continue current antihypertensive regimen.   Aortic root dilation: CT of chest in March 2022 showed ascending aorta measuring 3.9 cm nodules,  lymphadenopathy.  Repeat CT recommended in March 2024.  CKD: Abnormal CT/nocturnal hypoxemia: Noted to have mediastinal lymphadenopathy on CT chest from August 2021 concerning for sarcoidosis.  Follow-up CT chest in March 2022 showed resolution of adenopathy.  Sleep study was negative for OSA, however, he does have evidence of nocturnal hypoxia.  Nocturnal home oxygen.  Follows with pulmonology. Disposition: Follow-up in  Home Medications    Current Outpatient Medications  Medication Sig Dispense Refill   acetaminophen (TYLENOL) 325 MG tablet Take 2 tablets (650 mg total) by mouth every 6 (six) hours as needed for mild pain (or Fever >/= 101). 12 tablet 0   carvedilol (COREG) 12.5 MG tablet TAKE 1 TABLET BY MOUTH TWICE DAILY WITH A MEAL FOR BLOOD PRESSURE 180 tablet 1   hydrALAZINE (APRESOLINE) 50 MG tablet Take 1 tablet (50 mg total) by mouth 3 (three) times daily. 90 tablet 0   methocarbamol (ROBAXIN) 500 MG tablet Take 500 mg by mouth at bedtime.     Olmesartan-amLODIPine-HCTZ 40-10-25 MG TABS Take 1 tablet by mouth once daily 90 tablet 1   spironolactone (ALDACTONE) 50 MG tablet Take 1 tablet (50 mg total) by mouth daily. Please schedule appt for future refills. 1st attempt 15 tablet 0   No current facility-administered medications for this visit.     Review of Systems    ***.  All other systems reviewed and are otherwise negative except as noted above.    Physical Exam    VS:  There were no vitals taken for this visit. , BMI There is no height or weight on file to calculate BMI.     GEN: Well nourished, well developed, in no acute distress. HEENT: normal. Neck: Supple, no JVD, carotid bruits, or masses. Cardiac: RRR, no murmurs, rubs, or gallops. No clubbing, cyanosis, edema.  Radials/DP/PT 2+ and equal bilaterally.  Respiratory:  Respirations regular and unlabored, clear to auscultation bilaterally. GI: Soft, nontender, nondistended, BS + x 4. MS: no deformity or  atrophy. Skin: warm and dry, no rash. Neuro:  Strength and sensation are intact. Psych: Normal affect.  Accessory Clinical Findings    ECG personally reviewed by me today - *** - no acute changes.  Lab Results  Component Value Date   WBC 7.0 05/30/2020   HGB 14.2 05/30/2020   HCT 42.4 05/30/2020   MCV 92.0 05/30/2020   PLT 171 05/30/2020   Lab Results  Component Value Date   CREATININE 1.80 (H) 12/03/2020   BUN 23 (H) 06/29/2020   NA 140 06/29/2020   K 4.3 06/29/2020   CL 105 06/29/2020   CO2 25 06/29/2020   Lab Results  Component Value Date   ALT 23 05/30/2020   AST 24 05/30/2020   ALKPHOS 85 05/30/2020   BILITOT 1.5 (H) 05/30/2020   No results found for: CHOL, HDL, LDLCALC, LDLDIRECT, TRIG, CHOLHDL  No results found for: HGBA1C  Assessment & Plan    1.  ***   Ricardo Grapes, NP 03/14/2022, 8:52 AM

## 2022-03-14 ENCOUNTER — Ambulatory Visit: Payer: Commercial Managed Care - PPO | Admitting: Nurse Practitioner

## 2022-03-14 DIAGNOSIS — I77810 Thoracic aortic ectasia: Secondary | ICD-10-CM

## 2022-03-14 DIAGNOSIS — I5042 Chronic combined systolic (congestive) and diastolic (congestive) heart failure: Secondary | ICD-10-CM

## 2022-03-14 DIAGNOSIS — I10 Essential (primary) hypertension: Secondary | ICD-10-CM

## 2022-03-14 NOTE — Telephone Encounter (Signed)
Called and notified patient of results and he voiced understanding. He is agreeable to use oxygen overnight.  Patient wanted to know if he should still keep f/u appt for July. Dr. Craige Cotta please advise.

## 2022-03-15 ENCOUNTER — Encounter: Payer: Self-pay | Admitting: Nurse Practitioner

## 2022-03-21 ENCOUNTER — Encounter: Payer: Self-pay | Admitting: Nurse Practitioner

## 2022-03-22 NOTE — Telephone Encounter (Signed)
He needs to keep appointment.  Need to have further discussion about whether additional testing needed to determine why his oxygen level is staying low at night.

## 2022-03-22 NOTE — Telephone Encounter (Signed)
ATC patient  

## 2022-03-23 NOTE — Telephone Encounter (Signed)
ATC patient. No answer but left detailed vm (ok per dpr) with recs from Dr. Craige Cotta and reminded him of appt. Advised on vm to call back for further questions or concerns, Nothing further is needed at this time.

## 2022-04-07 ENCOUNTER — Ambulatory Visit: Payer: Commercial Managed Care - PPO | Admitting: Nurse Practitioner

## 2022-04-07 NOTE — Progress Notes (Deleted)
Office Visit    Patient Name: Ricardo Evans Date of Encounter: 04/07/2022  Primary Care Provider:  [name], MD Primary Cardiologist:  Rollene Rotunda, MD  Chief Complaint    41 year old male with a history of chronic combined systolic and diastolic heart failure, hypertension, aortic root dilation, lymphadenopathy on CT, and CKD who presents for follow-up related to heart failure, hypertension, and aortic root dilation.  Past Medical History    Past Medical History:  Diagnosis Date   Hypertension    Hypertensive heart disease    Systolic HF (heart failure) (HCC)    Past Surgical History:  Procedure Laterality Date   SPIGELIAN HERNIA     TONSILLECTOMY AND ADENOIDECTOMY      Allergies  Allergies  Allergen Reactions   Cephalexin    Penicillins    Sulfonamide Derivatives     History of Present Illness    41 year old male with the above past medical history including chronic combined systolic and diastolic heart failure, hypertension, aortic root dilation, and CKD.   He was hospitalized in August 2021 with shortness of breath and hypertensive urgency.  Echo showed EF 45 to 50%, severe LVH.  He was started on carvedilol and amlodipine.  Of note, there was no evidence of RAS on CT abdomen/pelvis/MRA abdomen. CT angio of the chest showed a 4.2 cm ascending thoracic aorta.  He was not seen by cardiology during that visit.  He was last seen in the office on 12/08/2020 and was stable from a cardiac standpoint.  BP was well controlled.  Follow-up CT of chest in March 2022 showed ascending aorta measuring 3.9 cm nodules, lymphadenopathy.  Repeat CT was recommended in 2 years.  He presents today for follow-up.  Since his last visit  Chronic combined systolic and diastolic heart failure:  Echo in 05/2020 showed EF 45 to 50%, severe LVH.   Repeat echocardiogram Hypertension: BP well controlled. Continue current antihypertensive regimen.   Aortic root dilation: CT of chest in  March 2022 showed ascending aorta measuring 3.9 cm nodules, lymphadenopathy.  Repeat CT recommended in March 2024.  CKD: Abnormal CT/nocturnal hypoxemia: Noted to have mediastinal lymphadenopathy on CT chest from August 2021 concerning for sarcoidosis.  Follow-up CT chest in March 2022 showed resolution of adenopathy.  Sleep study was negative for OSA, however, he does have evidence of nocturnal hypoxia.  Nocturnal home oxygen.  Follows with pulmonology. Disposition: Follow-up in  Home Medications    Current Outpatient Medications  Medication Sig Dispense Refill   acetaminophen (TYLENOL) 325 MG tablet Take 2 tablets (650 mg total) by mouth every 6 (six) hours as needed for mild pain (or Fever >/= 101). 12 tablet 0   carvedilol (COREG) 12.5 MG tablet TAKE 1 TABLET BY MOUTH TWICE DAILY WITH A MEAL FOR BLOOD PRESSURE 180 tablet 1   hydrALAZINE (APRESOLINE) 50 MG tablet Take 1 tablet (50 mg total) by mouth 3 (three) times daily. 90 tablet 0   methocarbamol (ROBAXIN) 500 MG tablet Take 500 mg by mouth at bedtime.     Olmesartan-amLODIPine-HCTZ 40-10-25 MG TABS Take 1 tablet by mouth once daily 90 tablet 1   spironolactone (ALDACTONE) 50 MG tablet Take 1 tablet (50 mg total) by mouth daily. Please schedule appt for future refills. 1st attempt 15 tablet 0   No current facility-administered medications for this visit.     Review of Systems    ***.  All other systems reviewed and are otherwise negative except as noted above.  Physical Exam    VS:  There were no vitals taken for this visit. , BMI There is no height or weight on file to calculate BMI.     GEN: Well nourished, well developed, in no acute distress. HEENT: normal. Neck: Supple, no JVD, carotid bruits, or masses. Cardiac: RRR, no murmurs, rubs, or gallops. No clubbing, cyanosis, edema.  Radials/DP/PT 2+ and equal bilaterally.  Respiratory:  Respirations regular and unlabored, clear to auscultation bilaterally. GI: Soft, nontender,  nondistended, BS + x 4. MS: no deformity or atrophy. Skin: warm and dry, no rash. Neuro:  Strength and sensation are intact. Psych: Normal affect.  Accessory Clinical Findings    ECG personally reviewed by me today - *** - no acute changes.  Lab Results  Component Value Date   WBC 7.0 05/30/2020   HGB 14.2 05/30/2020   HCT 42.4 05/30/2020   MCV 92.0 05/30/2020   PLT 171 05/30/2020   Lab Results  Component Value Date   CREATININE 1.80 (H) 12/03/2020   BUN 23 (H) 06/29/2020   NA 140 06/29/2020   K 4.3 06/29/2020   CL 105 06/29/2020   CO2 25 06/29/2020   Lab Results  Component Value Date   ALT 23 05/30/2020   AST 24 05/30/2020   ALKPHOS 85 05/30/2020   BILITOT 1.5 (H) 05/30/2020   No results found for: "CHOL", "HDL", "LDLCALC", "LDLDIRECT", "TRIG", "CHOLHDL"  No results found for: "HGBA1C"  Assessment & Plan    1.  ***   Joylene Grapes, NP 04/07/2022, 6:44 AM

## 2022-04-12 ENCOUNTER — Ambulatory Visit: Payer: Commercial Managed Care - PPO | Admitting: Nurse Practitioner

## 2022-04-19 NOTE — Progress Notes (Unsigned)
Cardiology Office Note   Date:  04/20/2022   ID:  Ricardo Evans, DOB [birth year], MRN [id number]  PCP:  [name], MD  Cardiologist:   Rollene Rotunda, MD   Chief Complaint  Patient presents with   Cardiomyopathy     History of Present Illness: Ricardo Evans is a 41 y.o. male who is referred from the hospital with HTN and acute systolic and diastolic heart failure.  He was in the hospital in August 2021.   He presented with SOB and hypertensive urgency.  Echo EF was 45 - 50% with severe left ventricular hypertrophy.  He was sent home in increased dose of Coreg and amlodipine.  Of note there was no evidence of RAS on CT.  He was not seen by cardiology during that visit.   In the hospital he was noted to have a BNP of 265.  Potassium initially was low at 3.1.  He did have some evidence of vascular prominence suggestive of volume overload on his chest x-ray.  He was treated with IV labetalol.  An MRA with no evidence of renal artery stenosis.  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  There was a mention of aortic atherosclerosis.  MRA demonstrated widely patent renal arteries.  There were 2 on the right and a single on the left.  He returns for follow up.  Since I last saw him he has had weight gain 40 pounds over about 3 years.  He has had no new shortness of breath although he has been diagnosed with sleep apnea and says he gets 2 L of oxygen at night.  He is not describing new PND or orthopnea.  He is denying any chest pressure, neck or arm discomfort.  He has had some intermittent lower extremity swelling but is down today.  He says he watches his diet and in particular salt.  He says his blood pressure has been much better controlled on the meds as listed.  His creatinine has come down as listed below  Of note he has been canceled or no-show for multiple appointments because of insurance.   Past Medical History:  Diagnosis Date   Hypertension    Hypertensive heart  disease    Systolic HF (heart failure) (HCC)     Past Surgical History:  Procedure Laterality Date   SPIGELIAN HERNIA     TONSILLECTOMY AND ADENOIDECTOMY       Current Outpatient Medications  Medication Sig Dispense Refill   acetaminophen (TYLENOL) 325 MG tablet Take 2 tablets (650 mg total) by mouth every 6 (six) hours as needed for mild pain (or Fever >/= 101). 12 tablet 0   carvedilol (COREG) 12.5 MG tablet TAKE 1 TABLET BY MOUTH TWICE DAILY WITH A MEAL FOR BLOOD PRESSURE 180 tablet 1   furosemide (LASIX) 20 MG tablet Take 20 mg by mouth daily.     hydrALAZINE (APRESOLINE) 50 MG tablet Take 1 tablet (50 mg total) by mouth 3 (three) times daily. 90 tablet 0   Olmesartan-amLODIPine-HCTZ 40-10-25 MG TABS Take 1 tablet by mouth once daily 90 tablet 1   methocarbamol (ROBAXIN) 500 MG tablet Take 500 mg by mouth at bedtime. (Patient not taking: Reported on 04/20/2022)     No current facility-administered medications for this visit.    Allergies:   Cephalexin, Penicillins, and Sulfonamide derivatives    Social History:  The patient   ROS:  Please see the history of present illness.   Otherwise, review  of systems are positive for none.   All other systems are reviewed and negative.    PHYSICAL EXAM: VS:  BP 132/84   Pulse 86   Ht 6' (1.829 m)   Wt 258 lb 4.8 oz (117.2 kg)   SpO2 96%   BMI 35.03 kg/m  , BMI Body mass index is 35.03 kg/m. GENERAL:  Well appearing NECK:  No jugular venous distention, waveform within normal limits, carotid upstroke brisk and symmetric, no bruits, no thyromegaly LUNGS:  Clear to auscultation bilaterally CHEST:  Unremarkable HEART:  PMI not displaced or sustained,S1 and S2 within normal limits, no S3, no S4, no clicks, no rubs, no murmurs ABD:  Flat, positive bowel sounds normal in frequency in pitch, no bruits, no rebound, no guarding, no midline pulsatile mass, no hepatomegaly, no splenomegaly EXT:  2 plus pulses throughout, mild bilateral lower  extremity edema, no cyanosis no clubbing    EKG:  EKG is  ordered today. Sinus rhythm, rate 86, axis within normal limits, intervals within normal limits, no acute ST-T wave changes.   Recent Labs: No results found for requested labs within last 365 days.    Lipid Panel No results found for: "CHOL", "TRIG", "HDL", "CHOLHDL", "VLDL", "LDLCALC", "LDLDIRECT"    Wt Readings from Last 3 Encounters:  04/20/22 258 lb 4.8 oz (117.2 kg)  02/22/22 254 lb (115.2 kg)  12/08/20 237 lb (107.5 kg)      Other studies Reviewed: Additional studies/ records that were reviewed today include: Pulmonary records Review of the above records demonstrates:  Please see elsewhere in the note.     ASSESSMENT AND PLAN:  SYSTOLIC HF:   He does not seem to have exceptional volume overload at this point.  However, he is overdue for an echocardiogram and I will follow-up on this.  Further management will be based on these results.  HTN:   His blood pressure is controlled.  And he can continue the meds as listed.   DILATED AORTIC ROOT: His aortic root was only 39 mm on CT in March of last year.  No change in therapy.  CKD:   Creat was 1.58 which was down from 1.8 .  He is followed by nephrology.  No change  OBESITY: He has gained weight and we talked about diet     Labs/ tests ordered today include:   Orders Placed This Encounter  Procedures   EKG 12-Lead   ECHOCARDIOGRAM COMPLETE     Disposition:   FU with me in 12 months or sooner pending the results of the echo  Signed, Rollene Rotunda, MD  04/20/2022 3:34 PM    [facility]

## 2022-04-20 ENCOUNTER — Ambulatory Visit: Payer: Commercial Managed Care - PPO | Admitting: Cardiology

## 2022-04-20 ENCOUNTER — Encounter: Payer: Self-pay | Admitting: Cardiology

## 2022-04-20 VITALS — BP 132/84 | HR 86 | Ht 72.0 in | Wt 258.3 lb

## 2022-04-20 DIAGNOSIS — R9389 Abnormal findings on diagnostic imaging of other specified body structures: Secondary | ICD-10-CM

## 2022-04-20 DIAGNOSIS — I429 Cardiomyopathy, unspecified: Secondary | ICD-10-CM

## 2022-04-20 DIAGNOSIS — I5021 Acute systolic (congestive) heart failure: Secondary | ICD-10-CM | POA: Diagnosis not present

## 2022-04-20 DIAGNOSIS — N1832 Chronic kidney disease, stage 3b: Secondary | ICD-10-CM

## 2022-04-20 DIAGNOSIS — I10 Essential (primary) hypertension: Secondary | ICD-10-CM

## 2022-04-20 DIAGNOSIS — I77810 Thoracic aortic ectasia: Secondary | ICD-10-CM

## 2022-04-20 NOTE — Patient Instructions (Signed)
Medication Instructions:  Your physician recommends that you continue on your current medications as directed. Please refer to the Current Medication list given to you today.  *If you need a refill on your cardiac medications before your next appointment, please call your pharmacy*   Lab Work: None If you have labs (blood work) drawn today and your tests are completely normal, you will receive your results only by: MyChart Message (if you have MyChart) OR A paper copy in the mail If you have any lab test that is abnormal or we need to change your treatment, we will call you to review the results.   Testing/Procedures: Your physician has requested that you have an echocardiogram. Echocardiography is a painless test that uses sound waves to create images of your heart. It provides your doctor with information about the size and shape of your heart and how well your heart's chambers and valves are working. This procedure takes approximately one hour. There are no restrictions for this procedure.    Follow-Up: At [facility], you and your health needs are our priority.  As part of our continuing mission to provide you with exceptional heart care, we have created designated Provider Care Teams.  These Care Teams include your primary Cardiologist (physician) and Advanced Practice Providers (APPs -  Physician Assistants and Nurse Practitioners) who all work together to provide you with the care you need, when you need it.  We recommend signing up for the patient portal called "MyChart".  Sign up information is provided on this After Visit Summary.  MyChart is used to connect with patients for Virtual Visits (Telemedicine).  Patients are able to view lab/test results, encounter notes, upcoming appointments, etc.  Non-urgent messages can be sent to your provider as well.   To learn more about what you can do with MyChart, go to [email or web address].    Your next appointment:   1 year(s)  The  format for your next appointment:   In Person  Provider:   Rollene Rotunda, MD     Other Instructions   Important Information About Sugar

## 2022-04-21 ENCOUNTER — Other Ambulatory Visit: Payer: Self-pay | Admitting: Cardiology

## 2022-04-24 ENCOUNTER — Ambulatory Visit: Payer: Commercial Managed Care - PPO | Admitting: Pulmonary Disease

## 2022-04-28 ENCOUNTER — Ambulatory Visit (INDEPENDENT_AMBULATORY_CARE_PROVIDER_SITE_OTHER): Payer: Commercial Managed Care - PPO

## 2022-04-28 DIAGNOSIS — I5022 Chronic systolic (congestive) heart failure: Secondary | ICD-10-CM

## 2022-04-28 DIAGNOSIS — I429 Cardiomyopathy, unspecified: Secondary | ICD-10-CM

## 2022-04-28 LAB — ECHOCARDIOGRAM COMPLETE
Area-P 1/2: 3.76 cm2
Calc EF: 71.7 %
MV M vel: 1.74 m/s
MV Peak grad: 12.1 mmHg
S' Lateral: 2.3 cm
Single Plane A2C EF: 72.1 %
Single Plane A4C EF: 72.2 %

## 2022-06-13 ENCOUNTER — Other Ambulatory Visit: Payer: Self-pay

## 2022-06-13 ENCOUNTER — Encounter: Payer: Self-pay | Admitting: Pulmonary Disease

## 2022-06-13 ENCOUNTER — Ambulatory Visit: Payer: Commercial Managed Care - PPO | Admitting: Pulmonary Disease

## 2022-06-13 VITALS — BP 110/80 | HR 96 | Ht 72.0 in | Wt 266.6 lb

## 2022-06-13 DIAGNOSIS — R0902 Hypoxemia: Secondary | ICD-10-CM | POA: Diagnosis not present

## 2022-06-13 NOTE — Addendum Note (Signed)
Addended by: Carleene Mains D on: 06/13/2022 04:07 PM   Modules accepted: Orders

## 2022-06-13 NOTE — Patient Instructions (Signed)
Will schedule ventilation perfusion lung scan and pulmonary function test at [facility]  Follow up in 2 months

## 2022-06-13 NOTE — Progress Notes (Signed)
[facility]  Chief Complaint  Patient presents with   Follow-up    Follow-up    Past Surgical History:  He  has a past surgical history that includes Spigelian hernia and Tonsillectomy and adenoidectomy.  Past Medical History:  Hypertension, Systolic CHF  Constitutional:  BP 110/80 (BP Location: Right Arm)   Pulse 96   Ht 6' (1.829 m)   Wt 266 lb 9.6 oz (120.9 kg)   SpO2 95%   BMI 36.16 kg/m   Brief Summary:  Ricardo Evans is a 41 y.o. male with nocturnal hypoxemia.      Subjective:   His overnight oximetry showed low oxygen.  He is wearing 2 liters oxygen at night.    Not having cough, wheeze, or sputum.  No breathing trouble during the day.  He still snores.  Has gained some weight.  No leg swelling.    Physical Exam:   Appearance - well kempt   ENMT - no sinus tenderness, no oral exudate, no LAN, Mallampati 3 airway, no stridor  Respiratory - equal breath sounds bilaterally, no wheezing or rales  CV - s1s2 regular rate and rhythm, no murmurs  Ext - no clubbing, no edema  Skin - no rashes  Psych - normal mood and affect    Pulmonary testing:    Chest Imaging:  CT angio chest 05/30/20 >> atherosclerosis, 4.2 cm ascending thoracic aorta, PA 3.6 cm diameter, mediastinal LAN up to 1.6 cm, bronchial wall thickening, mild mosaic attenuation, 4 mm nodule LUL CT chest 01/05/21 >> decreased size of mediastinal LN, stable 4 mm nodule LUL, enlarged PA  Sleep Tests:  HST 10/11/21 >> AHI 2.5, SpO2 low 78%; Spent 71.3 min with an SpO2 < 88% ONO with RA 02/28/22 >> test time 6 hrs 42 min.  Baseline SpO2 92%, low SpO2 81%.  Spent 17 min 14 sec with an SpO2 < 88%.  Cardiac Tests:  Echo 04/28/22 >> EF 60 to 65%, mild LVH, grade 1 DD  Social History:  He  reports that he has never smoked. He has never used smokeless tobacco. He reports current alcohol use. He reports that he does not use drugs.  Family History:  His family  history includes COPD in his father; Heart disease in his maternal grandmother; Suicidality in his mother.     Assessment/Plan:   Nocturnal hypoxemia. - still unclear what cause is - continue 2 liters oxygen at night - will arrange for V/Q scan with chest xray, and pulmonary function test - might need to have repeat sleep study, but would likely need to do this in the sleep lab  CKD 3a. - followed by Dr. Celso Amy with [facility]  Chronic combined CHF, Aortic root dilation, Hypertension. - followed by Dr. Antoine Poche with cardiology  Time Spent Involved in Patient Care on Day of Examination:  25 minutes  Follow up:   Patient Instructions  Will schedule ventilation perfusion lung scan and pulmonary function test at [facility]  Follow up in 2 months  Medication List:   Allergies as of 06/13/2022       Reactions   Cephalexin    Penicillins    Sulfa Antibiotics    Sulfonamide Derivatives         Medication List        Accurate as of June 13, 2022  2:49 PM. If you have any questions, ask your nurse or doctor.  acetaminophen 325 MG tablet Commonly known as: TYLENOL Take 2 tablets (650 mg total) by mouth every 6 (six) hours as needed for mild pain (or Fever >/= 101).   carvedilol 12.5 MG tablet Commonly known as: COREG TAKE 1 TABLET BY MOUTH TWICE DAILY WITH A MEAL FOR BLOOD PRESSURE PLEASE  SCHEDULE  AN  APPOINTMENT  FOR  FUTURE  REFILLS   furosemide 20 MG tablet Commonly known as: LASIX Take 20 mg by mouth daily.   hydrALAZINE 50 MG tablet Commonly known as: APRESOLINE TAKE 1 TABLET BY MOUTH THREE TIMES DAILY   methocarbamol 500 MG tablet Commonly known as: ROBAXIN Take 500 mg by mouth at bedtime.   Olmesartan-amLODIPine-HCTZ 40-10-25 MG Tabs Take 1 tablet by mouth once daily        Signature:  Coralyn Helling, MD [facility]monary/Critical Care Pager - [phone] 06/13/2022, 2:49 PM

## 2022-06-20 ENCOUNTER — Ambulatory Visit (HOSPITAL_COMMUNITY)
Admission: RE | Admit: 2022-06-20 | Discharge: 2022-06-20 | Disposition: A | Discharge status: Home / Self Care | Payer: Commercial Managed Care - PPO | Source: Ambulatory Visit | Attending: Pulmonary Disease | Admitting: Pulmonary Disease

## 2022-06-20 ENCOUNTER — Encounter (HOSPITAL_COMMUNITY): Payer: Self-pay

## 2022-06-20 DIAGNOSIS — R0902 Hypoxemia: Secondary | ICD-10-CM | POA: Diagnosis present

## 2022-06-20 LAB — PULMONARY FUNCTION TEST
DL/VA % pred: 116 %
DL/VA: 5.33 ml/min/mmHg/L
DLCO unc % pred: 82 %
DLCO unc: 27.14 ml/min/mmHg
FEF 25-75 Post: 2.74 L/sec
FEF 25-75 Pre: 2.92 L/sec
FEF2575-%Change-Post: -6 %
FEF2575-%Pred-Post: 66 %
FEF2575-%Pred-Pre: 71 %
FEV1-%Change-Post: -4 %
FEV1-%Pred-Post: 64 %
FEV1-%Pred-Pre: 67 %
FEV1-Post: 2.89 L
FEV1-Pre: 3.01 L
FEV1FVC-%Change-Post: -4 %
FEV1FVC-%Pred-Pre: 100 %
FEV6-%Change-Post: 0 %
FEV6-%Pred-Post: 68 %
FEV6-%Pred-Pre: 68 %
FEV6-Post: 3.76 L
FEV6-Pre: 3.76 L
FEV6FVC-%Pred-Post: 102 %
FEV6FVC-%Pred-Pre: 102 %
FVC-%Change-Post: 0 %
FVC-%Pred-Post: 67 %
FVC-%Pred-Pre: 67 %
FVC-Post: 3.77 L
FVC-Pre: 3.76 L
Post FEV1/FVC ratio: 77 %
Post FEV6/FVC ratio: 100 %
Pre FEV1/FVC ratio: 80 %
Pre FEV6/FVC Ratio: 100 %
RV % pred: 165 %
RV: 3.25 L
TLC % pred: 88 %
TLC: 6.46 L

## 2022-06-20 MED ORDER — ALBUTEROL SULFATE (2.5 MG/3ML) 0.083% IN NEBU
2.5000 mg | INHALATION_SOLUTION | Freq: Once | RESPIRATORY_TRACT | Status: AC
  Administered 2022-06-20: 2.5 mg via RESPIRATORY_TRACT

## 2022-06-20 MED ORDER — TECHNETIUM TO 99M ALBUMIN AGGREGATED
4.0000 | Freq: Once | INTRAVENOUS | Status: AC | PRN
  Administered 2022-06-20: 4.2 via INTRAVENOUS

## 2022-06-28 ENCOUNTER — Other Ambulatory Visit: Payer: Self-pay | Admitting: Cardiology

## 2022-07-27 ENCOUNTER — Other Ambulatory Visit: Payer: Self-pay | Admitting: Cardiology

## 2022-09-06 ENCOUNTER — Other Ambulatory Visit: Payer: Self-pay | Admitting: Cardiology

## 2022-09-20 ENCOUNTER — Encounter: Payer: Self-pay | Admitting: Pulmonary Disease

## 2022-09-20 ENCOUNTER — Ambulatory Visit: Payer: Commercial Managed Care - PPO | Admitting: Pulmonary Disease

## 2022-09-20 VITALS — BP 136/86 | HR 94 | Ht 72.0 in | Wt 263.4 lb

## 2022-09-20 DIAGNOSIS — Z23 Encounter for immunization: Secondary | ICD-10-CM | POA: Diagnosis not present

## 2022-09-20 DIAGNOSIS — G4734 Idiopathic sleep related nonobstructive alveolar hypoventilation: Secondary | ICD-10-CM

## 2022-09-20 NOTE — Progress Notes (Signed)
[facility]  Chief Complaint  Patient presents with   Follow-up    Pt f/u doing well, still wearing 2L at night    Past Surgical History:  He  has a past surgical history that includes Spigelian hernia and Tonsillectomy and adenoidectomy.  Past Medical History:  Hypertension, Systolic CHF  Constitutional:  BP 136/86   Pulse 94   Ht 6' (1.829 m)   Wt 263 lb 6.4 oz (119.5 kg)   SpO2 100%   BMI 35.72 kg/m   Brief Summary:  Ricardo Evans is a 41 y.o. male with nocturnal hypoxemia.      Subjective:   PFT and V/Q scan were unremarkable.  He is using 2 liters at night.  Sleeps well.  Gets up once to use the bathroom.  Feels rested.  Not having issues with snoring, or shortness of breath at night.  Physical Exam:   Appearance - well kempt   ENMT - no sinus tenderness, no oral exudate, no LAN, Mallampati 3 airway, no stridor  Respiratory - equal breath sounds bilaterally, no wheezing or rales  CV - s1s2 regular rate and rhythm, no murmurs  Ext - no clubbing, no edema  Skin - no rashes  Psych - normal mood and affect    Pulmonary testing:  PFT 06/20/22 >> FEV1 3.01 (67%), FEV1% 80, TLC 6.46 (88%), DLCO 82%  Chest Imaging:  CT angio chest 05/30/20 >> atherosclerosis, 4.2 cm ascending thoracic aorta, PA 3.6 cm diameter, mediastinal LAN up to 1.6 cm, bronchial wall thickening, mild mosaic attenuation, 4 mm nodule LUL CT chest 01/05/21 >> decreased size of mediastinal LN, stable 4 mm nodule LUL, enlarged PA V/Q scan 06/20/22 >> normal  Sleep Tests:  HST 10/11/21 >> AHI 2.5, SpO2 low 78%; Spent 71.3 min with an SpO2 < 88% ONO with RA 02/28/22 >> test time 6 hrs 42 min.  Baseline SpO2 92%, low SpO2 81%.  Spent 17 min 14 sec with an SpO2 < 88%.  Cardiac Tests:  Echo 04/28/22 >> EF 60 to 65%, mild LVH, grade 1 DD  Social History:  He  reports that he has never smoked. He has never used smokeless tobacco. He reports current  alcohol use. He reports that he does not use drugs.  Family History:  His family history includes COPD in his father; Heart disease in his maternal grandmother; Suicidality in his mother.     Assessment/Plan:   Nocturnal hypoxemia. - continue 2 liters oxygen at night - could arrange for in lab sleep study if he gets more trouble with his sleep  CKD 3a. - followed by Dr. Celso Amy with [facility]  Chronic combined CHF, Aortic root dilation, Hypertension. - followed by Dr. Antoine Poche with cardiology  Time Spent Involved in Patient Care on Day of Examination:  15 minutes  Follow up:   Patient Instructions  Flu shot today  Follow up in 1 year  Medication List:   Allergies as of 09/20/2022       Reactions   Cephalexin    Penicillins    Sulfa Antibiotics    Sulfonamide Derivatives         Medication List        Accurate as of September 20, 2022 10:44 AM. If you have any questions, ask your nurse or doctor.          acetaminophen 325 MG tablet Commonly known as: TYLENOL Take 2 tablets (650 mg total) by  mouth every 6 (six) hours as needed for mild pain (or Fever >/= 101).   carvedilol 12.5 MG tablet Commonly known as: COREG Take 1 tablet (12.5 mg total) by mouth 2 (two) times daily with a meal.   furosemide 20 MG tablet Commonly known as: LASIX Take 20 mg by mouth daily.   hydrALAZINE 50 MG tablet Commonly known as: APRESOLINE TAKE 1 TABLET BY MOUTH THREE TIMES DAILY   methocarbamol 500 MG tablet Commonly known as: ROBAXIN Take 500 mg by mouth at bedtime.   Olmesartan-amLODIPine-HCTZ 40-10-25 MG Tabs Take 1 tablet by mouth once daily        Signature:  Coralyn Helling, MD [facility] Pulmonary/Critical Care Pager - [phone] 09/20/2022, 10:44 AM

## 2022-09-20 NOTE — Patient Instructions (Signed)
Flu shot today Follow up in 1 year 

## 2023-01-23 ENCOUNTER — Other Ambulatory Visit: Payer: Self-pay | Admitting: Cardiology

## 2023-02-11 ENCOUNTER — Other Ambulatory Visit: Payer: Self-pay

## 2023-02-11 ENCOUNTER — Emergency Department (HOSPITAL_BASED_OUTPATIENT_CLINIC_OR_DEPARTMENT_OTHER)
Admission: EM | Admit: 2023-02-11 | Discharge: 2023-02-11 | Disposition: A | Discharge status: Home / Self Care | Payer: Commercial Managed Care - PPO | Attending: Emergency Medicine | Admitting: Emergency Medicine

## 2023-02-11 ENCOUNTER — Emergency Department (HOSPITAL_BASED_OUTPATIENT_CLINIC_OR_DEPARTMENT_OTHER): Payer: Commercial Managed Care - PPO

## 2023-02-11 DIAGNOSIS — K5792 Diverticulitis of intestine, part unspecified, without perforation or abscess without bleeding: Secondary | ICD-10-CM

## 2023-02-11 DIAGNOSIS — K5732 Diverticulitis of large intestine without perforation or abscess without bleeding: Secondary | ICD-10-CM | POA: Insufficient documentation

## 2023-02-11 DIAGNOSIS — I10 Essential (primary) hypertension: Secondary | ICD-10-CM | POA: Diagnosis not present

## 2023-02-11 DIAGNOSIS — Z79899 Other long term (current) drug therapy: Secondary | ICD-10-CM | POA: Insufficient documentation

## 2023-02-11 DIAGNOSIS — R1032 Left lower quadrant pain: Secondary | ICD-10-CM | POA: Diagnosis present

## 2023-02-11 LAB — LIPASE, BLOOD: Lipase: 23 U/L (ref 11–51)

## 2023-02-11 LAB — COMPREHENSIVE METABOLIC PANEL
ALT: 20 U/L (ref 0–44)
AST: 14 U/L — ABNORMAL LOW (ref 15–41)
Albumin: 4.1 g/dL (ref 3.5–5.0)
Alkaline Phosphatase: 67 U/L (ref 38–126)
Anion gap: 9 (ref 5–15)
BUN: 20 mg/dL (ref 6–20)
CO2: 24 mmol/L (ref 22–32)
Calcium: 9.4 mg/dL (ref 8.9–10.3)
Chloride: 102 mmol/L (ref 98–111)
Creatinine, Ser: 1.23 mg/dL (ref 0.61–1.24)
GFR, Estimated: >60 mL/min (ref >60)
Glucose, Bld: 124 mg/dL — ABNORMAL HIGH (ref 70–99)
Potassium: 3.8 mmol/L (ref 3.5–5.1)
Sodium: 135 mmol/L (ref 135–145)
Total Bilirubin: 1.2 mg/dL (ref 0.3–1.2)
Total Protein: 7.5 g/dL (ref 6.5–8.1)

## 2023-02-11 LAB — CBC
HCT: 41.4 % (ref 39.0–52.0)
Hemoglobin: 14.6 g/dL (ref 13.0–17.0)
MCH: 31.9 pg (ref 26.0–34.0)
MCHC: 35.3 g/dL (ref 30.0–36.0)
MCV: 90.4 fL (ref 80.0–100.0)
Platelets: 155 10*3/uL (ref 150–400)
RBC: 4.58 MIL/uL (ref 4.22–5.81)
RDW: 13.5 % (ref 11.5–15.5)
WBC: 11.6 10*3/uL — ABNORMAL HIGH (ref 4.0–10.5)
nRBC: 0 % (ref 0.0–0.2)

## 2023-02-11 MED ORDER — IOHEXOL 300 MG/ML  SOLN
100.0000 mL | Freq: Once | INTRAMUSCULAR | Status: AC | PRN
  Administered 2023-02-11: 100 mL via INTRAVENOUS

## 2023-02-11 MED ORDER — METRONIDAZOLE 500 MG PO TABS: 500.0000 mg | ORAL_TABLET | Freq: Three times a day (TID) | ORAL | 0 refills | Status: AC

## 2023-02-11 MED ORDER — OXYCODONE-ACETAMINOPHEN 5-325 MG PO TABS
1.0000 | ORAL_TABLET | Freq: Once | ORAL | Status: AC
  Administered 2023-02-11: 1 via ORAL
  Filled 2023-02-11: qty 1

## 2023-02-11 MED ORDER — MORPHINE SULFATE (PF) 4 MG/ML IV SOLN
4.0000 mg | Freq: Once | INTRAVENOUS | Status: AC
  Administered 2023-02-11: 4 mg via INTRAVENOUS
  Filled 2023-02-11: qty 1

## 2023-02-11 MED ORDER — CIPROFLOXACIN HCL 500 MG PO TABS
500.0000 mg | ORAL_TABLET | Freq: Once | ORAL | Status: AC
  Administered 2023-02-11: 500 mg via ORAL
  Filled 2023-02-11: qty 1

## 2023-02-11 MED ORDER — CIPROFLOXACIN HCL 500 MG PO TABS: 500.0000 mg | ORAL_TABLET | Freq: Two times a day (BID) | ORAL | 0 refills | Status: AC

## 2023-02-11 MED ORDER — METRONIDAZOLE 500 MG PO TABS
500.0000 mg | ORAL_TABLET | Freq: Once | ORAL | Status: AC
  Administered 2023-02-11: 500 mg via ORAL
  Filled 2023-02-11: qty 1

## 2023-02-11 MED ORDER — OXYCODONE HCL 5 MG PO TABS: 5.0000 mg | ORAL_TABLET | Freq: Four times a day (QID) | ORAL | 0 refills | Status: DC | PRN

## 2023-02-11 NOTE — ED Provider Notes (Signed)
[place] EMERGENCY DEPARTMENT AT [facility] Provider Note   CSN: [id number] Arrival date & time: 02/11/23  4098     History  Chief Complaint  Patient presents with   Abdominal Pain    Ricardo Evans is a 42 y.o. male.  Patient is a 42 year old male with a past medical history of hypertension presenting to the emergency department with left lower quadrant pain.  The patient states that he has had pain in his left lower quadrant since Friday.  He states that it has been constant and has been worsening.  He states he has associated diarrhea but denies any black or bloody stools.  He denies any nausea or vomiting.  He denies any known fevers but states that he has felt chilled.  He denies any dysuria or hematuria.  He denies any prior abdominal surgeries.  He states that he has had 1 kidney stone in the past but is unsure if this feels similar.  The history is provided by the patient and the spouse.  Abdominal Pain      Home Medications Prior to Admission medications   Medication Sig Start Date End Date Taking? Authorizing Provider  ciprofloxacin (CIPRO) 500 MG tablet Take 1 tablet (500 mg total) by mouth every 12 (twelve) hours for 7 days. 02/11/23 02/18/23 Yes Theresia Lo, Benetta Spar K, DO  metroNIDAZOLE (FLAGYL) 500 MG tablet Take 1 tablet (500 mg total) by mouth 3 (three) times daily for 7 days. 02/11/23 02/18/23 Yes Theresia Lo, Benetta Spar K, DO  oxyCODONE (ROXICODONE) 5 MG immediate release tablet Take 1 tablet (5 mg total) by mouth every 6 (six) hours as needed for Evans pain. 02/11/23  Yes Elayne Snare K, DO  acetaminophen (TYLENOL) 325 MG tablet Take 2 tablets (650 mg total) by mouth every 6 (six) hours as needed for mild pain (or Fever >/= 101). 06/02/20   Shon Hale, MD  carvedilol (COREG) 12.5 MG tablet Take 1 tablet (12.5 mg total) by mouth 2 (two) times daily with a meal. 09/06/22   Rollene Rotunda, MD  furosemide (LASIX) 20 MG tablet Take 20 mg by mouth daily.  03/03/22   [provider]  hydrALAZINE (APRESOLINE) 50 MG tablet TAKE 1 TABLET BY MOUTH THREE TIMES DAILY 01/23/23   Rollene Rotunda, MD  methocarbamol (ROBAXIN) 500 MG tablet Take 500 mg by mouth at bedtime. 11/16/20   [provider]  Olmesartan-amLODIPine-HCTZ 40-10-25 MG TABS Take 1 tablet by mouth once daily 07/27/22   Rollene Rotunda, MD      Allergies    Cephalexin, Penicillins, Sulfa antibiotics, and Sulfonamide derivatives    Review of Systems   Review of Systems  Gastrointestinal:  Positive for abdominal pain.    Physical Exam Updated Vital Signs BP 138/80 (BP Location: Right Arm)   Pulse 96   Temp 98.9 F (37.2 C) (Oral)   Resp (!) 21   Ht 6' (1.829 m)   Wt 117.9 kg   SpO2 95%   BMI 35.26 kg/m  Physical Exam Vitals and nursing note reviewed.  Constitutional:      General: He is not in acute distress.    Appearance: He is well-developed.  HENT:     Head: Normocephalic and atraumatic.     Mouth/Throat:     Mouth: Mucous membranes are moist.     Pharynx: Oropharynx is clear.  Eyes:     Extraocular Movements: Extraocular movements intact.  Cardiovascular:     Rate and Rhythm: Normal rate and regular rhythm.  Heart sounds: Normal heart sounds.  Pulmonary:     Effort: Pulmonary effort is normal.     Breath sounds: Normal breath sounds.  Abdominal:     General: Abdomen is flat.     Palpations: Abdomen is soft.     Tenderness: There is abdominal tenderness in the left lower quadrant. There is no guarding or rebound.  Skin:    General: Skin is warm and dry.  Neurological:     General: No focal deficit present.     Mental Status: He is alert and oriented to person, place, and time.  Psychiatric:        Mood and Affect: Mood normal.        Behavior: Behavior normal.     ED Results / Procedures / Treatments   Labs (all labs ordered are listed, but only abnormal results are displayed) Labs Reviewed  COMPREHENSIVE METABOLIC PANEL -  Abnormal; Notable for the following components:      Result Value   Glucose, Bld 124 (*)    AST 14 (*)    All other components within normal limits  CBC - Abnormal; Notable for the following components:   WBC 11.6 (*)    All other components within normal limits  LIPASE, BLOOD    EKG None  Radiology CT ABDOMEN PELVIS W CONTRAST  Result Date: 02/11/2023 CLINICAL DATA:  Left lower quadrant pain. EXAM: CT ABDOMEN AND PELVIS WITH CONTRAST TECHNIQUE: Multidetector CT imaging of the abdomen and pelvis was performed using the standard protocol following bolus administration of intravenous contrast. RADIATION DOSE REDUCTION: This exam was performed according to the departmental dose-optimization program which includes automated exposure control, adjustment of the mA and/or kV according to patient size and/or use of iterative reconstruction technique. CONTRAST:  OMNIPAQUE IOHEXOL 300 MG/ML  SOLN COMPARISON:  12/03/2020 FINDINGS: Lower Chest: No acute findings. Hepatobiliary: No hepatic masses identified. Mild diffuse hepatic steatosis is noted. Gallbladder is unremarkable. No evidence of biliary ductal dilatation. Pancreas:  No mass or inflammatory changes. Spleen: Within normal limits in size and appearance. Adrenals/Urinary Tract: No suspicious masses identified. No evidence of ureteral calculi or hydronephrosis. Stomach/Bowel: Mild to moderate diverticulitis is seen involving the descending colon. No evidence of perforation or abscess. Vascular/Lymphatic: No pathologically enlarged lymph nodes. No acute vascular findings. Reproductive:  No mass or other significant abnormality. Other:  None. Musculoskeletal:  No suspicious bone lesions identified. IMPRESSION: Mild to moderate diverticulitis involving the descending colon. No evidence of perforation, abscess, or other complication. Mild hepatic steatosis. Electronically Signed   By: Danae Orleans M.D.   On: 02/11/2023 08:49    Procedures Procedures     Medications Ordered in ED Medications  oxyCODONE-acetaminophen (PERCOCET/ROXICET) 5-325 MG per tablet 1 tablet (has no administration in time range)  metroNIDAZOLE (FLAGYL) tablet 500 mg (has no administration in time range)  ciprofloxacin (CIPRO) tablet 500 mg (has no administration in time range)  morphine (PF) 4 MG/ML injection 4 mg (4 mg Intravenous Given 02/11/23 0808)  iohexol (OMNIPAQUE) 300 MG/ML solution 100 mL (100 mLs Intravenous Contrast Given 02/11/23 0831)    ED Course/ Medical Decision Making/ A&P Clinical Course as of 02/11/23 0909  Sun Feb 11, 2023  0855 CTAP with diverticulitis without complication. He does have a penicillin allergy and will be treated with cipro flagyl. He was recommended PCP follow up and follow up colonoscopy. [VK]    Clinical Course User Index [VK] Rexford Maus, DO  Medical Decision Making This patient presents to the ED with chief complaint(s) of abdominal pain with pertinent past medical history of hypertension which further complicates the presenting complaint. The complaint involves an extensive differential diagnosis and also carries with it a high risk of complications and morbidity.    The differential diagnosis includes diverticulitis, gastroenteritis, nephrolithiasis, pyelonephritis, muscle strain, no rash making shingles unlikely, other intra-abdominal infection  Additional history obtained: Additional history obtained from spouse Records reviewed outpatient pulm/nephro records  ED Course and Reassessment: On patient's arrival to the emergency department he is well-appearing in no acute distress.  He was initially evaluated by triage and had labs ordered including CBC, CMP and lipase.  Patient will additionally have CT abdomen and pelvis performed to evaluate for possible causes of his pain.  He was given morphine for pain control and will be closely reassessed.  Independent labs interpretation:   The following labs were independently interpreted: mild leukocytosis otherwise within normal range  Independent visualization of imaging: - I independently visualized the following imaging with scope of interpretation limited to determining acute life threatening conditions related to emergency care: CTAP, which revealed diverticulitis without complication  Consultation: - Consulted or discussed management/test interpretation w/ external professional: N/A  Consideration for admission or further workup: Patient has no emergent conditions requiring admission or further work-up at this time and is stable for discharge home with primary care follow-up  Social Determinants of health: N/A    Amount and/or Complexity of Data Reviewed Labs: ordered. Radiology: ordered.  Risk Prescription drug management.          Final Clinical Impression(s) / ED Diagnoses Final diagnoses:  Diverticulitis    Rx / DC Orders ED Discharge Orders          Ordered    metroNIDAZOLE (FLAGYL) 500 MG tablet  3 times daily        02/11/23 0905    ciprofloxacin (CIPRO) 500 MG tablet  Every 12 hours        02/11/23 0905    oxyCODONE (ROXICODONE) 5 MG immediate release tablet  Every 6 hours PRN        02/11/23 0905              Rexford Maus, DO 02/11/23 [phone]

## 2023-02-11 NOTE — ED Triage Notes (Addendum)
Arrives with complaints of worsening LLQ abdominal pain x3 days. Patient report no relief with tylenol. No vomiting, diarrhea, or fevers. Reports hx of kidney stones several years ago.

## 2023-02-11 NOTE — Discharge Instructions (Addendum)
You were seen in the emergency department for your abdominal pain.  Your workup shows that you have diverticulitis.  You can get outpouchings in your colon usually related to diet and constipation and these can become infected.  You had no signs of any complications.  I have given you antibiotics and you should complete these as prescribed.  He can take Tylenol every 6 hours as needed for pain and I have given you oxycodone to take for breakthrough pain.  Do not take this before driving, working, operating heavy machinery or watching small children alone.  You can follow-up with your primary doctor in the next few days to have your symptoms rechecked and once your infection has cleared up you should follow-up with GI as you often need a colonoscopy.  You should return to the emergency department if you are having fevers despite the antibiotics, repetitive vomiting, significantly worsening pain or if you have any other new or concerning symptoms.

## 2023-02-15 ENCOUNTER — Emergency Department (HOSPITAL_COMMUNITY): Payer: Commercial Managed Care - PPO

## 2023-02-15 ENCOUNTER — Other Ambulatory Visit: Payer: Self-pay

## 2023-02-15 ENCOUNTER — Emergency Department (HOSPITAL_COMMUNITY)
Admission: EM | Admit: 2023-02-15 | Discharge: 2023-02-16 | Disposition: A | Discharge status: Home / Self Care | Payer: Commercial Managed Care - PPO | Attending: Emergency Medicine | Admitting: Emergency Medicine

## 2023-02-15 DIAGNOSIS — R109 Unspecified abdominal pain: Secondary | ICD-10-CM | POA: Diagnosis present

## 2023-02-15 DIAGNOSIS — K5732 Diverticulitis of large intestine without perforation or abscess without bleeding: Secondary | ICD-10-CM | POA: Insufficient documentation

## 2023-02-15 DIAGNOSIS — K5792 Diverticulitis of intestine, part unspecified, without perforation or abscess without bleeding: Secondary | ICD-10-CM

## 2023-02-15 LAB — COMPREHENSIVE METABOLIC PANEL
ALT: 24 U/L (ref 0–44)
AST: 22 U/L (ref 15–41)
Albumin: 4.2 g/dL (ref 3.5–5.0)
Alkaline Phosphatase: 60 U/L (ref 38–126)
Anion gap: 10 (ref 5–15)
BUN: 30 mg/dL — ABNORMAL HIGH (ref 6–20)
CO2: 25 mmol/L (ref 22–32)
Calcium: 9.4 mg/dL (ref 8.9–10.3)
Chloride: 102 mmol/L (ref 98–111)
Creatinine, Ser: 1.43 mg/dL — ABNORMAL HIGH (ref 0.61–1.24)
GFR, Estimated: >60 mL/min (ref >60)
Glucose, Bld: 92 mg/dL (ref 70–99)
Potassium: 3.8 mmol/L (ref 3.5–5.1)
Sodium: 137 mmol/L (ref 135–145)
Total Bilirubin: 0.8 mg/dL (ref 0.3–1.2)
Total Protein: 8.1 g/dL (ref 6.5–8.1)

## 2023-02-15 LAB — LIPASE, BLOOD: Lipase: 43 U/L (ref 11–51)

## 2023-02-15 LAB — URINALYSIS, ROUTINE W REFLEX MICROSCOPIC
Bacteria, UA: NONE SEEN
Bilirubin Urine: NEGATIVE
Glucose, UA: NEGATIVE mg/dL
Hgb urine dipstick: NEGATIVE
Ketones, ur: NEGATIVE mg/dL
Nitrite: NEGATIVE
Protein, ur: NEGATIVE mg/dL
Specific Gravity, Urine: 1.019 (ref 1.005–1.030)
pH: 5 (ref 5.0–8.0)

## 2023-02-15 LAB — CBC
HCT: 43.2 % (ref 39.0–52.0)
Hemoglobin: 14.6 g/dL (ref 13.0–17.0)
MCH: 31.5 pg (ref 26.0–34.0)
MCHC: 33.8 g/dL (ref 30.0–36.0)
MCV: 93.3 fL (ref 80.0–100.0)
Platelets: 186 10*3/uL (ref 150–400)
RBC: 4.63 MIL/uL (ref 4.22–5.81)
RDW: 13.4 % (ref 11.5–15.5)
WBC: 5.8 10*3/uL (ref 4.0–10.5)
nRBC: 0 % (ref 0.0–0.2)

## 2023-02-15 MED ORDER — ETODOLAC 400 MG PO TABS: 400.0000 mg | ORAL_TABLET | Freq: Two times a day (BID) | ORAL | 0 refills | Status: DC

## 2023-02-15 MED ORDER — SODIUM CHLORIDE 0.9 % IV BOLUS
1000.0000 mL | Freq: Once | INTRAVENOUS | Status: AC
  Administered 2023-02-15: 1000 mL via INTRAVENOUS

## 2023-02-15 MED ORDER — IOHEXOL 300 MG/ML  SOLN
100.0000 mL | Freq: Once | INTRAMUSCULAR | Status: AC | PRN
  Administered 2023-02-15: 100 mL via INTRAVENOUS

## 2023-02-15 MED ORDER — KETOROLAC TROMETHAMINE 30 MG/ML IJ SOLN
30.0000 mg | Freq: Once | INTRAMUSCULAR | Status: AC
  Administered 2023-02-15: 30 mg via INTRAVENOUS
  Filled 2023-02-15: qty 1

## 2023-02-15 NOTE — Discharge Instructions (Signed)
The CT scan shows that your diverticulitis is improving but not resolved.  Take the antibiotics as prescribed.  Rest drink plenty of fluids.  Follow-up with a GI doctor to be rechecked with

## 2023-02-15 NOTE — ED Triage Notes (Signed)
Pt was at PCP today as a follow up from appointment from Sunday when diagnosed with diverticulitis. Pt advised from PCP to come to ER for further workup. Pt states pain is not any better. Hurts worse with movement and abd is more distended today.

## 2023-02-15 NOTE — ED Provider Notes (Addendum)
[place] EMERGENCY DEPARTMENT AT [facility] Provider Note   CSN: [id number] Arrival date & time: 02/15/23  4098     History  Chief Complaint  Patient presents with   Abdominal Pain    Ricardo Evans is a 42 y.o. male.   Abdominal Pain    Patient has history of hypertension who presents ED for evaluation of persistent abdominal pain.Patient was seen in the emergency room on April 21.  Patient had a CT scan that showed evidence of diverticulitis.  Patient was discharged home with prescriptions for ciprofloxacin and metronidazole.  Patient states he has been taking his pain medications as well as his antibiotics.  He went to see his primary care doctor today because he was continuing to have pain in his abdomen.  They symptom to the ED for further evaluation.  Patient denies any trouble with any fevers.  No vomiting.  He has felt chilled at times  Home Medications Prior to Admission medications   Medication Sig Start Date End Date Taking? Authorizing Provider  etodolac (LODINE) 400 MG tablet Take 1 tablet (400 mg total) by mouth 2 (two) times daily. 02/15/23  Yes Linwood Dibbles, MD  acetaminophen (TYLENOL) 325 MG tablet Take 2 tablets (650 mg total) by mouth every 6 (six) hours as needed for mild pain (or Fever >/= 101). 06/02/20   Shon Hale, MD  carvedilol (COREG) 12.5 MG tablet Take 1 tablet (12.5 mg total) by mouth 2 (two) times daily with a meal. 09/06/22   Rollene Rotunda, MD  ciprofloxacin (CIPRO) 500 MG tablet Take 1 tablet (500 mg total) by mouth every 12 (twelve) hours for 7 days. 02/11/23 02/18/23  Elayne Snare K, DO  furosemide (LASIX) 20 MG tablet Take 20 mg by mouth daily. 03/03/22   [provider]  hydrALAZINE (APRESOLINE) 50 MG tablet TAKE 1 TABLET BY MOUTH THREE TIMES DAILY 01/23/23   Rollene Rotunda, MD  methocarbamol (ROBAXIN) 500 MG tablet Take 500 mg by mouth at bedtime. 11/16/20   [provider]  metroNIDAZOLE (FLAGYL) 500 MG tablet  Take 1 tablet (500 mg total) by mouth 3 (three) times daily for 7 days. 02/11/23 02/18/23  Rexford Maus, DO  Olmesartan-amLODIPine-HCTZ 40-10-25 MG TABS Take 1 tablet by mouth once daily 07/27/22   Rollene Rotunda, MD  oxyCODONE (ROXICODONE) 5 MG immediate release tablet Take 1 tablet (5 mg total) by mouth every 6 (six) hours as needed for severe pain. 02/11/23   Rexford Maus, DO      Allergies    Cephalexin, Penicillins, Sulfa antibiotics, and Sulfonamide derivatives    Review of Systems   Review of Systems  Gastrointestinal:  Positive for abdominal pain.    Physical Exam Updated Vital Signs BP 107/79 (BP Location: Left Arm)   Pulse 79   Temp 98.1 F (36.7 C) (Oral)   Resp 17   Wt 117.5 kg   SpO2 97%   BMI 35.13 kg/m  Physical Exam Vitals and nursing note reviewed.  Constitutional:      General: He is not in acute distress.    Appearance: He is well-developed.  HENT:     Head: Normocephalic and atraumatic.     Right Ear: External ear normal.     Left Ear: External ear normal.  Eyes:     General: No scleral icterus.       Right eye: No discharge.        Left eye: No discharge.     Conjunctiva/sclera: Conjunctivae  normal.  Neck:     Trachea: No tracheal deviation.  Cardiovascular:     Rate and Rhythm: Normal rate and regular rhythm.  Pulmonary:     Effort: Pulmonary effort is normal. No respiratory distress.     Breath sounds: Normal breath sounds. No stridor. No wheezing or rales.  Abdominal:     General: Bowel sounds are normal. There is no distension.     Palpations: Abdomen is soft.     Tenderness: There is abdominal tenderness in the left lower quadrant. There is no guarding or rebound.  Musculoskeletal:        General: No tenderness or deformity.     Cervical back: Neck supple.  Skin:    General: Skin is warm and dry.     Findings: No rash.  Neurological:     General: No focal deficit present.     Mental Status: He is alert.     Cranial  Nerves: No cranial nerve deficit, dysarthria or facial asymmetry.     Sensory: No sensory deficit.     Motor: No abnormal muscle tone or seizure activity.     Coordination: Coordination normal.  Psychiatric:        Mood and Affect: Mood normal.     ED Results / Procedures / Treatments   Labs (all labs ordered are listed, but only abnormal results are displayed) Labs Reviewed  COMPREHENSIVE METABOLIC PANEL - Abnormal; Notable for the following components:      Result Value   BUN 30 (*)    Creatinine, Ser 1.43 (*)    All other components within normal limits  URINALYSIS, ROUTINE W REFLEX MICROSCOPIC - Abnormal; Notable for the following components:   Leukocytes,Ua TRACE (*)    All other components within normal limits  LIPASE, BLOOD  CBC    EKG None  Radiology CT ABDOMEN PELVIS W CONTRAST  Result Date: 02/15/2023 CLINICAL DATA:  Diverticulitis, complication suspected EXAM: CT ABDOMEN AND PELVIS WITH CONTRAST TECHNIQUE: Multidetector CT imaging of the abdomen and pelvis was performed using the standard protocol following bolus administration of intravenous contrast. RADIATION DOSE REDUCTION: This exam was performed according to the departmental dose-optimization program which includes automated exposure control, adjustment of the mA and/or kV according to patient size and/or use of iterative reconstruction technique. CONTRAST:  OMNIPAQUE IOHEXOL 300 MG/ML  SOLN COMPARISON:  02/11/2023 FINDINGS: Lower chest: No acute findings Hepatobiliary: No focal hepatic abnormality. Gallbladder unremarkable. Pancreas: No focal abnormality or ductal dilatation. Spleen: No focal abnormality.  Normal size. Adrenals/Urinary Tract: No adrenal abnormality. No focal renal abnormality. No stones or hydronephrosis. Urinary bladder is unremarkable. Stomach/Bowel: Diverticulosis involving the descending colon. There is mild inflammatory stranding adjacent to the mid descending colon compatible with  diverticulitis. This is improved slightly since prior study. No abscess or complicating feature. No bowel obstruction. Vascular/Lymphatic: No evidence of aneurysm or adenopathy. Reproductive: No visible focal abnormality. Other: No free fluid or free air. Musculoskeletal: No acute bony abnormality. IMPRESSION: Continued changes of diverticulitis involving the mid descending colon. This is improved slightly since prior study. No complicating feature. Electronically Signed   By: Charlett Nose M.D.   On: 02/15/2023 23:44    Procedures Procedures    Medications Ordered in ED Medications  ketorolac (TORADOL) 30 MG/ML injection 30 mg (30 mg Intravenous Given 02/15/23 2305)  sodium chloride 0.9 % bolus 1,000 mL (1,000 mLs Intravenous New Bag/Given 02/15/23 2304)  iohexol (OMNIPAQUE) 300 MG/ML solution 100 mL (100 mLs Intravenous Contrast Given 02/15/23  2323)    ED Course/ Medical Decision Making/ A&P Clinical Course as of 02/15/23 2357  Thu Feb 15, 2023  2251 Labs reviewed.  Normal CBC metabolic panel.  Normal lipase.  Urinalysis unremarkable. [JK]  2347 CT scan shows persistent changes of diverticulitis although improving since previous CT scan [JK]    Clinical Course User Index [JK] Linwood Dibbles, MD                             Medical Decision Making Amount and/or Complexity of Data Reviewed Labs: ordered. Radiology: ordered.  Risk Prescription drug management.   Patient presented to the ED for evaluation of persistent abdominal pain.  Patient was recently diagnosed with diverticulitis.  He has been on antibiotics for a few days now.  No signs of leukocytosis but with his persistent pain and no improvement it is possible he may have a complication such as diverticular abscess.  Will proceed with repeat CT scan to assess for any complications.    CT scan shows persistent diverticulitis although improving.  No complicating features such as abscess or perforation.  Patient appears appropriate  to continue with his outpatient management of diverticulitis  Evaluation and diagnostic testing in the emergency department does not suggest an emergent condition requiring admission or immediate intervention beyond what has been performed at this time.  The patient is safe for discharge and has been instructed to return immediately for worsening symptoms, change in symptoms or any other concerns.   Final Clinical Impression(s) / ED Diagnoses Final diagnoses:  Diverticulitis    Rx / DC Orders ED Discharge Orders          Ordered    etodolac (LODINE) 400 MG tablet  2 times daily        02/15/23 2356             Linwood Dibbles, MD 02/15/23 2357

## 2023-02-15 NOTE — ED Notes (Signed)
Patient transported to CT 

## 2023-02-20 ENCOUNTER — Encounter: Payer: Self-pay | Admitting: Physician Assistant

## 2023-02-21 NOTE — Progress Notes (Unsigned)
GI Office Note    Referring Provider: [facility] Primary Care Physician:  [facility]  Primary Gastroenterologist: Gerrit Friends.Rourk, MD   Chief Complaint   Chief Complaint  Patient presents with   ER follow up     Diverticulitis twice, ER recommending colonoscopy   History of Present Illness   Ricardo Evans is a 42 y.o. male presenting today at the request of [facility] for ED follow up of diverticulitis.  Recent ED visit 02/11/2023.  Complaint of left lower quadrant abdominal pain for few days, is constant and worsening.  Having associated diarrhea but denies melena or BRBPR.  Denies any known fevers.  Has had some chills.  No prior abdominal surgeries.  Stated history of kidney stones in the past.  Patient had CT scan as outlined below.  Provided with morphine for pain control.  Discharged home with Cipro and Flagyl.  Labs with mild leukocytosis of WBC 11.6.  CMP and lipase unremarkable.  CT A/P 02/11/2023: -Mild to moderate descending colonic diverticulitis -Mild hepatic steatosis -Gallbladder unremarkable, no biliary ductal dilation.  Subsequent ED visit 02/15/2023.  Reported persistent abdominal pain.  Taking Cipro and Flagyl as well as pain medication.  Still feeling chills at times.  Underwent CT as outlined below. Labs with normal CBC.  Creatinine elevated at 1.43, BUN 30, otherwise unremarkable.  Normal lipase.  UA with trace leukocytes.  CT 02/15/2023: -Continued changes of diverticulitis involving the mid descending colon, slightly improved from prior.  No complicating features. -No evidence of bowel obstruction. -No focal hepatic abnormality noted.  Gallbladder unremarkable.   Today:  Pain is improved. Still having some discomfort. Eating bland diet currently. Has some intermittent morning nausea but no vomiting. Has had some mild bleeding the last couple days with BM, with wiping. No fevers or chills the last couple of days.  This past Sunday he was not feeling great at all. Appetite slowly improving. Eating chicken, [place], eggs, rice, etc. Jello makes her feel . Like ginger ale.   No dysphagia. Has lost a few pounds from lack of appetite. Mildly scared to eat.   Has a vacation scheduled in July.   Has worked all week this week except Monday.   Reports history of heart failure in the past. No le swelling. No shortness of breath. A little over a year ago he was given supplemental oxygen but has not needed it. Had a sleep study done and told he does not have sleep apnea. Denies any increased chest pain above baseline.   Does not typically have constipation. Last BM today but BM before that was the other day. Notices he has been going less frequent. Used to go 2-3 times daily now once daily. No change in consistency.  Reports diverticulitis runs in his family.   Social drinking. No alcohol in 3 weeks.   Current Outpatient Medications  Medication Sig Dispense Refill   acetaminophen (TYLENOL) 325 MG tablet Take 2 tablets (650 mg total) by mouth every 6 (six) hours as needed for mild pain (or Fever >/= 101). 12 tablet 0   carvedilol (COREG) 12.5 MG tablet Take 1 tablet (12.5 mg total) by mouth 2 (two) times daily with a meal. 180 tablet 3   etodolac (LODINE) 400 MG tablet Take 1 tablet (400 mg total) by mouth 2 (two) times daily. 14 tablet 0   furosemide (LASIX) 20 MG tablet Take 20 mg by mouth daily.     hydrALAZINE (APRESOLINE) 50 MG  tablet TAKE 1 TABLET BY MOUTH THREE TIMES DAILY 90 tablet 0   Olmesartan-amLODIPine-HCTZ 40-10-25 MG TABS Take 1 tablet by mouth once daily 90 tablet 2   No current facility-administered medications for this visit.    Past Medical History:  Diagnosis Date   Hypertension    Hypertensive heart disease    Systolic HF (heart failure) (HCC)     Past Surgical History:  Procedure Laterality Date   SPIGELIAN HERNIA     TONSILLECTOMY AND ADENOIDECTOMY      Family History   Problem Relation Age of Onset   Suicidality Mother    COPD Father    Heart disease Maternal Grandmother     Allergies as of 02/22/2023 - Review Complete 02/22/2023  Allergen Reaction Noted   Cephalexin     Penicillins     Sulfa antibiotics  08/04/2021   Sulfonamide derivatives      Social History   Socioeconomic History   Marital status: Single    Spouse name: Not on file   Number of children: Not on file   Years of education: Not on file   Highest education level: Not on file  Occupational History   Not on file  Tobacco Use   Smoking status: Never   Smokeless tobacco: Never  Vaping Use   Vaping Use: Never used  Substance and Sexual Activity   Alcohol use: Yes   Drug use: Never   Sexual activity: Yes  Other Topics Concern   Not on file  Social History Narrative   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     Social Determinants of Health   Financial Resource Strain: Not on file  Food Insecurity: Not on file  Transportation Needs: Not on file  Physical Activity: Not on file  Stress: Not on file  Social Connections: Not on file  Intimate Partner Violence: Not on file     Review of Systems   Gen: Denies any fever, chills, fatigue, weight loss, lack of appetite.  CV: Denies chest pain, heart palpitations, peripheral edema, syncope.  Resp: Denies shortness of breath at rest or with exertion. Denies wheezing or cough.  GI: see HPI GU : Denies urinary burning, urinary frequency, urinary hesitancy MS: Denies joint pain, muscle weakness, cramps, or limitation of movement.  Derm: Denies rash, itching, dry skin Psych: Denies depression, anxiety, memory loss, and confusion Heme: Denies bruising, bleeding, and enlarged lymph nodes.   Physical Exam   BP 101/68 (BP Location: Right Arm, Patient Position: Sitting, Cuff Size: Large)   Pulse 73   Temp 97.6 F (36.4 C) (Oral)   Ht 6' (1.829 m)   Wt 256 lb 12.8 oz (116.5 kg)   SpO2 96%   BMI 34.83 kg/m   General:    Alert and oriented. Pleasant and cooperative. Well-nourished and well-developed.  Head:  Normocephalic and atraumatic. Eyes:  Without icterus, sclera clear and conjunctiva pink.  Ears:  Normal auditory acuity. Mouth:  No deformity or lesions, oral mucosa pink.  Lungs:  Clear to auscultation bilaterally. No wheezes, rales, or rhonchi. No distress.  Heart:  S1, S2 present without murmurs appreciated.  Abdomen:  +BS, soft, non-distended. No HSM noted. No guarding or rebound. No masses appreciated.  Rectal:  Deferred  Msk:  Symmetrical without gross deformities. Normal posture. Extremities:  Without edema. Neurologic:  Alert and  oriented x4;  grossly normal neurologically. Skin:  Intact without significant lesions or rashes. Psych:  Alert and cooperative. Normal mood and affect.  Assessment   Ricardo Evans is a 42 y.o. male with a history of systolic heart failure, HTN, CKD presenting today for ED follow up of diverticulitis.   Diverticulitis, scant hematochezia: Seen in the ED twice within the last 2 weeks for diverticulitis.  CT scans consistent with mid descending diverticulitis.  Most recent scan with noted improvement on antibiotics.  Just recently completed course of Cipro and Flagyl.  Overall not having any pain currently just some mild abdominal discomfort and some occasional nausea.  Slowly increasing his diet.  Currently following normal bland diet.  No evidence of overt constipation however does note a mild decrease in frequency from baseline.  Weight loss secondary to poor appetite that is improving.  Discussed slowly increasing diet and adding more fiber, separate handout/education provided.  No prior colonoscopy on file.  Denies any family history of colon cancer or colon polyps but does note a family history of diverticulitis.  Advised that he may use Gas-X for as needed bloating and to ensure he stays well-hydrated.  He is to notify for any worsening pain between now and planned  colonoscopy in 4 to 6 weeks.  Of note he has had some intermittent scant tissue hematochezia possibly related to diverticulitis versus hemorrhoids.  Will notify if this worsens.  PLAN   Proceed with colonoscopy with propofol by Dr. Jena Gauss in near future: the risks, benefits, and alternatives have been discussed with the patient in detail. The patient states understanding and desires to proceed. ASA 3 (4-6 weeks) Trilyte prep Slowly advance diet and begin increasing fiber Diverticulitis handout provided Drink adequate amounts of water. Gas ex as needed Follow up to be determined after procedure   Brooke Bonito, MSN, FNP-BC, AGACNP-BC [facility]

## 2023-02-22 ENCOUNTER — Encounter: Payer: Self-pay | Admitting: *Deleted

## 2023-02-22 ENCOUNTER — Ambulatory Visit: Payer: Commercial Managed Care - PPO | Admitting: Gastroenterology

## 2023-02-22 ENCOUNTER — Other Ambulatory Visit: Payer: Self-pay | Admitting: *Deleted

## 2023-02-22 ENCOUNTER — Encounter: Payer: Self-pay | Admitting: Gastroenterology

## 2023-02-22 VITALS — BP 101/68 | HR 73 | Temp 97.6°F | Ht 72.0 in | Wt 256.8 lb

## 2023-02-22 DIAGNOSIS — K921 Melena: Secondary | ICD-10-CM | POA: Diagnosis not present

## 2023-02-22 DIAGNOSIS — K5732 Diverticulitis of large intestine without perforation or abscess without bleeding: Secondary | ICD-10-CM

## 2023-02-22 MED ORDER — PEG 3350-KCL-NA BICARB-NACL 420 G PO SOLR: 4000.0000 mL | Freq: Once | ORAL | 0 refills | Status: AC

## 2023-02-22 NOTE — Patient Instructions (Addendum)
We are scheduling you for a colonoscopy in the near future with Dr. Jena Gauss.  We need to wait 4 to 6 weeks from now.  Will plan for early to mid June.  Continue following up with diet for another week, slowly reintroducing your typical foods.  You should start incorporating more fiber into your diet slowly.  I am attaching a handout about diverticulitis for you and a separate handout on high fiber foods.   Ensure you are drinking an adequate amount of water.  Please reach out to give any worsening abdominal pain between now and the colonoscopy.  May use gas ex for bloating and pepcid as needed for heartburn.   It was a pleasure to see you today. I want to create trusting relationships with patients. If you receive a survey regarding your visit,  I greatly appreciate you taking time to fill this out on paper or through your MyChart. I value your feedback.  Brooke Bonito, MSN, FNP-BC, AGACNP-BC [facility]

## 2023-04-02 NOTE — Patient Instructions (Signed)
Ricardo Evans  04/02/2023     @PREFPERIOPPHARMACY @   Your procedure is scheduled on  04/05/2023.   Report to [facility] at  0600  A.M.   Call this number if you have problems the morning of surgery:  [phone]  If you experience any cold or flu symptoms such as cough, fever, chills, shortness of breath, etc. between now and your scheduled surgery, please notify us at the above number.   Remember:  Follow the diet and prep instructions given to you by the office.     Take these medicines the morning of surgery with A SIP OF WATER                                          carvedilol.     Do not wear jewelry, make-up or nail polish, including gel polish,  artificial nails, or any other type of covering on natural nails (fingers and  toes).  Do not wear lotions, powders, or perfumes, or deodorant.  Do not shave 48 hours prior to surgery.  Men may shave face and neck.  Do not bring valuables to the hospital.  [facility] is not responsible for any belongings or valuables.  Contacts, dentures or bridgework may not be worn into surgery.  Leave your suitcase in the car.  After surgery it may be brought to your room.  For patients admitted to the hospital, discharge time will be determined by your treatment team.  Patients discharged the day of surgery will not be allowed to drive home and must have someone with them for 24 hours.    Special instructions:  DO NOT smoke tobacco or vape for 24 hours before your procedure.  Please read over the following fact sheets that you were given. Anesthesia Post-op Instructions and Care and Recovery After Surgery       Colonoscopy, Adult, Care After The following information offers guidance on how to care for yourself after your procedure. Your health care provider may also give you more specific instructions. If you have problems or questions, contact your health care provider. What can I expect after the procedure? After  the procedure, it is common to have: A small amount of blood in your stool for 24 hours after the procedure. Some gas. Mild cramping or bloating of your abdomen. Follow these instructions at home: Eating and drinking  Drink enough fluid to keep your urine pale yellow. Follow instructions from your health care provider about eating or drinking restrictions. Resume your normal diet as told by your health care provider. Avoid heavy or fried foods that are hard to digest. Activity Rest as told by your health care provider. Avoid sitting for a long time without moving. Get up to take short walks every 1-2 hours. This is important to improve blood flow and breathing. Ask for help if you feel weak or unsteady. Return to your normal activities as told by your health care provider. Ask your health care provider what activities are safe for you. Managing cramping and bloating  Try walking around when you have cramps or feel bloated. If directed, apply heat to your abdomen as told by your health care provider. Use the heat source that your health care provider recommends, such as a moist heat pack or a heating pad. Place a towel between your skin and the heat source.  Leave the heat on for 20-30 minutes. Remove the heat if your skin turns bright red. This is especially important if you are unable to feel pain, heat, or cold. You have a greater risk of getting burned. General instructions If you were given a sedative during the procedure, it can affect you for several hours. Do not drive or operate machinery until your health care provider says that it is safe. For the first 24 hours after the procedure: Do not sign important documents. Do not drink alcohol. Do your regular daily activities at a slower pace than normal. Eat soft foods that are easy to digest. Take over-the-counter and prescription medicines only as told by your health care provider. Keep all follow-up visits. This is  important. Contact a health care provider if: You have blood in your stool 2-3 days after the procedure. Get help right away if: You have more than a small spotting of blood in your stool. You have large blood clots in your stool. You have swelling of your abdomen. You have nausea or vomiting. You have a fever. You have increasing pain in your abdomen that is not relieved with medicine. These symptoms may be an emergency. Get help right away. Call 911. Do not wait to see if the symptoms will go away. Do not drive yourself to the hospital. Summary After the procedure, it is common to have a small amount of blood in your stool. You may also have mild cramping and bloating of your abdomen. If you were given a sedative during the procedure, it can affect you for several hours. Do not drive or operate machinery until your health care provider says that it is safe. Get help right away if you have a lot of blood in your stool, nausea or vomiting, a fever, or increased pain in your abdomen. This information is not intended to replace advice given to you by your health care provider. Make sure you discuss any questions you have with your health care provider. Document Revised: 11/21/2022 Document Reviewed: 06/01/2021 Elsevier Patient Education  2024 [employer] Monitored Anesthesia Care, Care After The following information offers guidance on how to care for yourself after your procedure. Your health care provider may also give you more specific instructions. If you have problems or questions, contact your health care provider. What can I expect after the procedure? After the procedure, it is common to have: Tiredness. Little or no memory about what happened during or after the procedure. Impaired judgment when it comes to making decisions. Nausea or vomiting. Some trouble with balance. Follow these instructions at home: For the time period you were told by your health care  provider:  Rest. Do not participate in activities where you could fall or become injured. Do not drive or use machinery. Do not drink alcohol. Do not take sleeping pills or medicines that cause drowsiness. Do not make important decisions or sign legal documents. Do not take care of children on your own. Medicines Take over-the-counter and prescription medicines only as told by your health care provider. If you were prescribed antibiotics, take them as told by your health care provider. Do not stop using the antibiotic even if you start to feel better. Eating and drinking Follow instructions from your health care provider about what you may eat and drink. Drink enough fluid to keep your urine pale yellow. If you vomit: Drink clear fluids slowly and in small amounts as you are able. Clear fluids include water, ice chips, low-calorie sports  drinks, and fruit juice that has water added to it (diluted fruit juice). Eat light and bland foods in small amounts as you are able. These foods include bananas, applesauce, rice, lean meats, toast, and crackers. General instructions  Have a responsible adult stay with you for the time you are told. It is important to have someone help care for you until you are awake and alert. If you have sleep apnea, surgery and some medicines can increase your risk for breathing problems. Follow instructions from your health care provider about wearing your sleep device: When you are sleeping. This includes during daytime naps. While taking prescription pain medicines, sleeping medicines, or medicines that make you drowsy. Do not use any products that contain nicotine or tobacco. These products include cigarettes, chewing tobacco, and vaping devices, such as e-cigarettes. If you need help quitting, ask your health care provider. Contact a health care provider if: You feel nauseous or vomit every time you eat or drink. You feel light-headed. You are still sleepy or  having trouble with balance after 24 hours. You get a rash. You have a fever. You have redness or swelling around the IV site. Get help right away if: You have trouble breathing. You have new confusion after you get home. These symptoms may be an emergency. Get help right away. Call 911. Do not wait to see if the symptoms will go away. Do not drive yourself to the hospital. This information is not intended to replace advice given to you by your health care provider. Make sure you discuss any questions you have with your health care provider. Document Revised: 03/06/2022 Document Reviewed: 03/06/2022 Elsevier Patient Education  2024 [employer].

## 2023-04-03 ENCOUNTER — Other Ambulatory Visit: Payer: Self-pay

## 2023-04-03 ENCOUNTER — Encounter (HOSPITAL_COMMUNITY)
Admission: RE | Admit: 2023-04-03 | Discharge: 2023-04-03 | Disposition: A | Discharge status: Home / Self Care | Payer: Commercial Managed Care - PPO | Source: Ambulatory Visit | Attending: Internal Medicine | Admitting: Internal Medicine

## 2023-04-03 ENCOUNTER — Encounter (HOSPITAL_COMMUNITY): Payer: Self-pay

## 2023-04-03 ENCOUNTER — Other Ambulatory Visit: Payer: Self-pay | Admitting: Cardiology

## 2023-04-03 VITALS — BP 100/70 | HR 66 | Temp 97.6°F | Resp 18 | Ht 72.0 in | Wt 256.8 lb

## 2023-04-03 DIAGNOSIS — Z01812 Encounter for preprocedural laboratory examination: Secondary | ICD-10-CM | POA: Diagnosis not present

## 2023-04-03 DIAGNOSIS — Z79899 Other long term (current) drug therapy: Secondary | ICD-10-CM | POA: Diagnosis not present

## 2023-04-03 LAB — BASIC METABOLIC PANEL
Anion gap: 12 (ref 5–15)
BUN: 30 mg/dL — ABNORMAL HIGH (ref 6–20)
CO2: 21 mmol/L — ABNORMAL LOW (ref 22–32)
Calcium: 9 mg/dL (ref 8.9–10.3)
Chloride: 104 mmol/L (ref 98–111)
Creatinine, Ser: 1.57 mg/dL — ABNORMAL HIGH (ref 0.61–1.24)
GFR, Estimated: 56 mL/min — ABNORMAL LOW (ref >60)
Glucose, Bld: 98 mg/dL (ref 70–99)
Potassium: 3.9 mmol/L (ref 3.5–5.1)
Sodium: 137 mmol/L (ref 135–145)

## 2023-04-05 ENCOUNTER — Encounter (HOSPITAL_COMMUNITY)
Admission: RE | Disposition: A | Discharge status: Home / Self Care | Payer: Self-pay | Source: Home / Self Care | Attending: Internal Medicine

## 2023-04-05 ENCOUNTER — Ambulatory Visit (HOSPITAL_BASED_OUTPATIENT_CLINIC_OR_DEPARTMENT_OTHER): Payer: Commercial Managed Care - PPO | Admitting: Anesthesiology

## 2023-04-05 ENCOUNTER — Encounter (HOSPITAL_COMMUNITY): Payer: Self-pay | Admitting: Internal Medicine

## 2023-04-05 ENCOUNTER — Ambulatory Visit (HOSPITAL_COMMUNITY)
Admission: RE | Admit: 2023-04-05 | Discharge: 2023-04-05 | Disposition: A | Discharge status: Home / Self Care | Payer: Commercial Managed Care - PPO | Attending: Internal Medicine | Admitting: Internal Medicine

## 2023-04-05 ENCOUNTER — Ambulatory Visit (HOSPITAL_COMMUNITY): Payer: Commercial Managed Care - PPO | Admitting: Anesthesiology

## 2023-04-05 DIAGNOSIS — R933 Abnormal findings on diagnostic imaging of other parts of digestive tract: Secondary | ICD-10-CM | POA: Diagnosis not present

## 2023-04-05 DIAGNOSIS — I10 Essential (primary) hypertension: Secondary | ICD-10-CM | POA: Diagnosis not present

## 2023-04-05 DIAGNOSIS — D126 Benign neoplasm of colon, unspecified: Secondary | ICD-10-CM

## 2023-04-05 DIAGNOSIS — K5730 Diverticulosis of large intestine without perforation or abscess without bleeding: Secondary | ICD-10-CM | POA: Diagnosis not present

## 2023-04-05 DIAGNOSIS — I5022 Chronic systolic (congestive) heart failure: Secondary | ICD-10-CM | POA: Insufficient documentation

## 2023-04-05 DIAGNOSIS — K5792 Diverticulitis of intestine, part unspecified, without perforation or abscess without bleeding: Secondary | ICD-10-CM

## 2023-04-05 DIAGNOSIS — D122 Benign neoplasm of ascending colon: Secondary | ICD-10-CM

## 2023-04-05 DIAGNOSIS — I110 Hypertensive heart disease with heart failure: Secondary | ICD-10-CM | POA: Insufficient documentation

## 2023-04-05 HISTORY — PX: POLYPECTOMY: SHX5525

## 2023-04-05 HISTORY — PX: COLONOSCOPY WITH PROPOFOL: SHX5780

## 2023-04-05 SURGERY — COLONOSCOPY WITH PROPOFOL
Anesthesia: General

## 2023-04-05 MED ORDER — PROPOFOL 10 MG/ML IV BOLUS
INTRAVENOUS | Status: DC | PRN
  Administered 2023-04-05: 30 mg via INTRAVENOUS
  Administered 2023-04-05: 40 mg via INTRAVENOUS
  Administered 2023-04-05: 110 mg via INTRAVENOUS

## 2023-04-05 MED ORDER — LIDOCAINE HCL (PF) 2 % IJ SOLN
INTRAMUSCULAR | Status: AC
  Filled 2023-04-05: qty 5

## 2023-04-05 MED ORDER — LACTATED RINGERS IV SOLN: INTRAVENOUS | Status: DC

## 2023-04-05 MED ORDER — PROPOFOL 500 MG/50ML IV EMUL
INTRAVENOUS | Status: DC | PRN
  Administered 2023-04-05: 150 ug/kg/min via INTRAVENOUS

## 2023-04-05 MED ORDER — PROPOFOL 500 MG/50ML IV EMUL
INTRAVENOUS | Status: AC
  Filled 2023-04-05: qty 100

## 2023-04-05 MED ORDER — LIDOCAINE HCL (PF) 2 % IJ SOLN
INTRAMUSCULAR | Status: AC
  Filled 2023-04-05: qty 10

## 2023-04-05 MED ORDER — LIDOCAINE HCL (CARDIAC) PF 100 MG/5ML IV SOSY
PREFILLED_SYRINGE | INTRAVENOUS | Status: DC | PRN
  Administered 2023-04-05: 80 mg via INTRAVENOUS

## 2023-04-05 MED ORDER — STERILE WATER FOR IRRIGATION IR SOLN
Status: DC | PRN
  Administered 2023-04-05: 120 mL

## 2023-04-05 NOTE — Anesthesia Postprocedure Evaluation (Signed)
Anesthesia Post Note  Patient: Ricardo Evans  Procedure(s) Performed: COLONOSCOPY WITH PROPOFOL POLYPECTOMY  Patient location during evaluation: Phase II Anesthesia Type: General Level of consciousness: awake and alert and oriented Pain management: pain level controlled Vital Signs Assessment: post-procedure vital signs reviewed and stable Respiratory status: spontaneous breathing, nonlabored ventilation and respiratory function stable Cardiovascular status: blood pressure returned to baseline and stable Postop Assessment: no apparent nausea or vomiting Anesthetic complications: no  No notable events documented.   Last Vitals:  Vitals:   04/05/23 0645 04/05/23 0753  BP: (!) 124/93 (!) 94/58  Pulse: 69 80  Resp: 16 16  Temp: 36.5 C 36.6 C  SpO2: 97% 98%    Last Pain:  Vitals:   04/05/23 0753  TempSrc: Oral  PainSc:                  Elly Haffey C Teia Freitas

## 2023-04-05 NOTE — Transfer of Care (Addendum)
Immediate Anesthesia Transfer of Care Note  Patient: SANDEEP RADELL  Procedure(s) Performed: COLONOSCOPY WITH PROPOFOL POLYPECTOMY  Patient Location: PACU and Short Stay  Anesthesia Type:General  Level of Consciousness: awake, drowsy, and patient cooperative  Airway & Oxygen Therapy: Patient Spontanous Breathing  Post-op Assessment: Report given to RN and Post -op Vital signs reviewed and stable  Post vital signs: Reviewed and stable  Last Vitals:  Vitals Value Taken Time  BP 94/58 04/05/2023    0753  /Temp 36.6 04/05/2023    0753  Pulse 80 04/05/2023    0753  Resp 16 04/05/2023    0753  SpO2 98% 04/05/2023    0753    Last Pain:  Vitals:   04/05/23 0733  TempSrc:   PainSc: 0-No pain         Complications: No notable events documented. / /

## 2023-04-05 NOTE — H&P (Signed)
@LOGO @   Primary Care Physician:  [facility] Primary Gastroenterologist:  Dr. Jena Gauss  Pre-Procedure History & Physical: HPI:  Ricardo Evans is a 42 y.o. male here for further evaluation of abnormal CT.  Treated for diverticulitis previously.  Clinically recovered.  CT suggestive of diverticulitis.  Past Medical History:  Diagnosis Date   Hypertension    Hypertensive heart disease    Systolic HF (heart failure) (HCC)     Past Surgical History:  Procedure Laterality Date   SPIGELIAN HERNIA     TONSILLECTOMY AND ADENOIDECTOMY      Prior to Admission medications   Medication Sig Start Date End Date Taking? Authorizing Provider  carvedilol (COREG) 12.5 MG tablet Take 1 tablet (12.5 mg total) by mouth 2 (two) times daily with a meal. 09/06/22  Yes Hochrein, Fayrene Fearing, MD  hydrALAZINE (APRESOLINE) 50 MG tablet TAKE 1 TABLET BY MOUTH THREE TIMES DAILY 04/03/23  Yes Rollene Rotunda, MD  Multiple Vitamins-Minerals (MULTIVITAMIN WITH MINERALS) tablet Take 1 tablet by mouth daily. Mega Men   Yes [provider]  Olmesartan-amLODIPine-HCTZ 40-10-25 MG TABS Take 1 tablet by mouth once daily 07/27/22  Yes Hochrein, Fayrene Fearing, MD  etodolac (LODINE) 400 MG tablet Take 1 tablet (400 mg total) by mouth 2 (two) times daily. Patient not taking: Reported on 03/28/2023 02/15/23   Linwood Dibbles, MD    Allergies as of 02/22/2023 - Review Complete 02/22/2023  Allergen Reaction Noted   Cephalexin     Penicillins     Sulfa antibiotics  08/04/2021   Sulfonamide derivatives      Family History  Problem Relation Age of Onset   Suicidality Mother    COPD Father    Heart disease Maternal Grandmother    Diverticulitis Maternal Uncle    Cancer - Colon Neg Hx    Colon polyps Neg Hx     Social History   Socioeconomic History   Marital status: Single    Spouse name: Not on file   Number of children: Not on file   Years of education: Not on file   Highest education level: Not on file   Occupational History   Not on file  Tobacco Use   Smoking status: Never   Smokeless tobacco: Never  Vaping Use   Vaping Use: Never used  Substance and Sexual Activity   Alcohol use: Not Currently   Drug use: Never   Sexual activity: Yes  Other Topics Concern   Not on file  Social History Narrative   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     Social Determinants of Health   Financial Resource Strain: Not on file  Food Insecurity: Not on file  Transportation Needs: Not on file  Physical Activity: Not on file  Stress: Not on file  Social Connections: Not on file  Intimate Partner Violence: Not on file    Review of Systems: See HPI, otherwise negative ROS  Physical Exam: BP (!) 124/93   Pulse 69   Temp 97.7 F (36.5 C) (Oral)   Resp 16   Ht 6' (1.829 m)   Wt 116.5 kg   SpO2 97%   BMI 34.83 kg/m  General:   Alert,  Well-developed, well-nourished, pleasant and cooperative in NAD Neck:  Supple; no masses or thyromegaly. No significant cervical adenopathy. Lungs:  Clear throughout to auscultation.   No wheezes, crackles, or rhonchi. No acute distress. Heart:  Regular rate and rhythm; no murmurs, clicks, rubs,  or gallops. Abdomen: Non-distended, normal bowel  sounds.  Soft and nontender without appreciable mass or hepatosplenomegaly.   Impression/Plan: 42 year old gentleman with abnormal CT recently treated for diverticulitis clinically he improved.  He is here for diagnostic colonoscopy to further evaluate abnormal colon on CT. The risks, benefits, limitations, alternatives and imponderables have been reviewed with the patient. Questions have been answered. All parties are agreeable.       Notice: This dictation was prepared with Dragon dictation along with smaller phrase technology. Any transcriptional errors that result from this process are unintentional and may not be corrected upon review.

## 2023-04-05 NOTE — Op Note (Signed)
[facility] Patient Name: Ricardo Evans Procedure Date: 04/05/2023 7:10 AM MRN: [id number] Date of Birth: 04-Jan-1981 Attending MD: Gennette Pac , MD, [id number] CSN: [id number] Age: 42 Admit Type: Outpatient Procedure:                Colonoscopy Indications:              Abnormal CT of the GI tract Providers:                Gennette Pac, MD, Zena Amos, Sheran Fava Referring MD:              Medicines:                Propofol per Anesthesia Complications:            No immediate complications. Estimated Blood Loss:     Estimated blood loss was minimal. Procedure:                Pre-Anesthesia Assessment:                           - Prior to the procedure, a History and Physical                            was performed, and patient medications and                            allergies were reviewed. The patient's tolerance of                            previous anesthesia was also reviewed. The risks                            and benefits of the procedure and the sedation                            options and risks were discussed with the patient.                            All questions were answered, and informed consent                            was obtained. Prior Anticoagulants: The patient has                            taken no anticoagulant or antiplatelet agents. ASA                            Grade Assessment: III - A patient with severe                            systemic disease. After reviewing the risks and  benefits, the patient was deemed in satisfactory                            condition to undergo the procedure.                           After obtaining informed consent, the colonoscope                            was passed under direct vision. Throughout the                            procedure, the patient's blood pressure, pulse, and                            oxygen  saturations were monitored continuously. The                            [id number]) scope was introduced through the                            anus and advanced to the the cecum, identified by                            appendiceal orifice and ileocecal valve. The                            colonoscopy was performed without difficulty. The                            patient tolerated the procedure well. The quality                            of the bowel preparation was adequate. The                            ileocecal valve, appendiceal orifice, and rectum                            were photographed. The entire colon was well                            visualized. Scope In: 7:37:21 AM Scope Out: 7:47:51 AM Scope Withdrawal Time: 0 hours 7 minutes 2 seconds  Total Procedure Duration: 0 hours 10 minutes 30 seconds  Findings:      The perianal and digital rectal examinations were normal.      A 4 mm polyp was found in the mid ascending colon. The polyp was       sessile. The polyp was removed with a cold snare. Resection and       retrieval were complete. Estimated blood loss was minimal.      Scattered medium-mouthed diverticula were found in the entire colon.       Scattered petechiae around the several descending colon diverticula.      The exam was otherwise  without abnormality on direct and retroflexion       views. Impression:               - One 4 mm polyp in the mid ascending colon,                            removed with a cold snare. Resected and retrieved.                           - Diverticulosis in the entire examined colon.                           - The examination was otherwise normal on direct                            and retroflexion views. Patient most likely                            experienced a recent bout of uncomplicated,                            self-limiting diverticulitis Moderate Sedation:      Moderate (conscious) sedation was personally  administered by an       anesthesia professional. The following parameters were monitored: oxygen       saturation, heart rate, blood pressure, respiratory rate, EKG, adequacy       of pulmonary ventilation, and response to care. Recommendation:           - Patient has a contact number available for                            emergencies. The signs and symptoms of potential                            delayed complications were discussed with the                            patient. Return to normal activities tomorrow.                            Written discharge instructions were provided to the                            patient.                           - Advance diet as tolerated.                           - Continue present medications. Daily Benefiber                            recommended.                           - Repeat colonoscopy date to be determined after  pending pathology results are reviewed for                            surveillance.                           - Return to GI office (date not yet determined). Procedure Code(s):        --- Professional ---                           [phone], Colonoscopy, flexible; with removal of                            tumor(s), polyp(s), or other lesion(s) by snare                            technique Diagnosis Code(s):        --- Professional ---                           D12.2, Benign neoplasm of ascending colon                           K57.30, Diverticulosis of large intestine without                            perforation or abscess without bleeding                           R93.3, Abnormal findings on diagnostic imaging of                            other parts of digestive tract CPT copyright 2022 American [employer] Association. All rights reserved. The codes documented in this report are preliminary and upon coder review may  be revised to meet current compliance requirements. Gerrit Friends. Adelyn Roscher,  MD Gennette Pac, MD 04/05/2023 8:03:50 AM This report has been signed electronically. Number of Addenda: 0

## 2023-04-05 NOTE — Discharge Instructions (Signed)
  Colonoscopy Discharge Instructions  Read the instructions outlined below and refer to this sheet in the next few weeks. These discharge instructions provide you with general information on caring for yourself after you leave the hospital. Your doctor may also give you specific instructions. While your treatment has been planned according to the most current medical practices available, unavoidable complications occasionally occur. If you have any problems or questions after discharge, call Dr. Jena Gauss at [phone]. ACTIVITY You may resume your regular activity, but move at a slower pace for the next 24 hours.  Take frequent rest periods for the next 24 hours.  Walking will help get rid of the air and reduce the bloated feeling in your belly (abdomen).  No driving for 24 hours (because of the medicine (anesthesia) used during the test).   Do not sign any important legal documents or operate any machinery for 24 hours (because of the anesthesia used during the test).  NUTRITION Drink plenty of fluids.  You may resume your normal diet as instructed by your doctor.  Begin with a light meal and progress to your normal diet. Heavy or fried foods are harder to digest and may make you feel sick to your stomach (nauseated).  Avoid alcoholic beverages for 24 hours or as instructed.  MEDICATIONS You may resume your normal medications unless your doctor tells you otherwise.  WHAT YOU CAN EXPECT TODAY Some feelings of bloating in the abdomen.  Passage of more gas than usual.  Spotting of blood in your stool or on the toilet paper.  IF YOU HAD POLYPS REMOVED DURING THE COLONOSCOPY: No aspirin products for 7 days or as instructed.  No alcohol for 7 days or as instructed.  Eat a soft diet for the next 24 hours.  FINDING OUT THE RESULTS OF YOUR TEST Not all test results are available during your visit. If your test results are not back during the visit, make an appointment with your caregiver to find out the  results. Do not assume everything is normal if you have not heard from your caregiver or the medical facility. It is important for you to follow up on all of your test results.  SEEK IMMEDIATE MEDICAL ATTENTION IF: You have more than a spotting of blood in your stool.  Your belly is swollen (abdominal distention).  You are nauseated or vomiting.  You have a temperature over 101.  You have abdominal pain or discomfort that is severe or gets worse throughout the day.      You do have diverticulosis-and likely had an attack of diverticulitis recently.  You also had a polyp that was removed  Begin Benefiber 1 tablespoon daily for 3 weeks; then increase to 2 tablespoons daily thereafter.  Take every day like a One-A-Day vitamin.  Further recommendations to follow pending review of pathology report  At patient request I called Melvenia Needles at [phone] -reviewed findings and recommendations

## 2023-04-05 NOTE — Anesthesia Preprocedure Evaluation (Signed)
Anesthesia Evaluation  Patient identified by MRN, date of birth, ID band Patient awake    Reviewed: Allergy & Precautions, NPO status , Patient's Chart, lab work & pertinent test results  History of Anesthesia Complications Negative for: history of anesthetic complications  Airway Mallampati: II  TM Distance: >3 FB Neck ROM: Full    Dental  (+) Missing,    Pulmonary neg shortness of breath, Not current smoker   breath sounds clear to auscultation       Cardiovascular hypertension (Well controlled.), Pt. on medications  Rhythm:Regular Rate:Normal     Neuro/Psych negative neurological ROS  negative psych ROS   GI/Hepatic Neg liver ROS,neg GERD  ,,  Endo/Other  negative endocrine ROS    Renal/GU negative Renal ROS Bladder dysfunction: H/O kidney stones in 2000's..      Musculoskeletal   Abdominal   Peds  Hematology   Anesthesia Other Findings   Reproductive/Obstetrics                             Anesthesia Physical Anesthesia Plan  ASA: 2  Anesthesia Plan: General   Post-op Pain Management:    Induction: Intravenous  PONV Risk Score and Plan: 0 and TIVA  Airway Management Planned: Natural Airway and Simple Face Mask  Additional Equipment:   Intra-op Plan:   Post-operative Plan:   Informed Consent: I have reviewed the patients History and Physical, chart, labs and discussed the procedure including the risks, benefits and alternatives for the proposed anesthesia with the patient or authorized representative who has indicated his/her understanding and acceptance.     Dental advisory given  Plan Discussed with: CRNA and Anesthesiologist  Anesthesia Plan Comments:        Anesthesia Quick Evaluation

## 2023-04-06 LAB — SURGICAL PATHOLOGY

## 2023-04-11 ENCOUNTER — Encounter: Payer: Self-pay | Admitting: Internal Medicine

## 2023-04-11 ENCOUNTER — Encounter (HOSPITAL_COMMUNITY): Payer: Self-pay | Admitting: Internal Medicine

## 2023-04-15 ENCOUNTER — Encounter (HOSPITAL_BASED_OUTPATIENT_CLINIC_OR_DEPARTMENT_OTHER): Payer: Self-pay

## 2023-04-15 ENCOUNTER — Emergency Department (HOSPITAL_BASED_OUTPATIENT_CLINIC_OR_DEPARTMENT_OTHER)
Admission: EM | Admit: 2023-04-15 | Discharge: 2023-04-15 | Disposition: A | Discharge status: Home / Self Care | Payer: Commercial Managed Care - PPO | Attending: Emergency Medicine | Admitting: Emergency Medicine

## 2023-04-15 ENCOUNTER — Emergency Department (HOSPITAL_BASED_OUTPATIENT_CLINIC_OR_DEPARTMENT_OTHER): Payer: Commercial Managed Care - PPO

## 2023-04-15 DIAGNOSIS — M25471 Effusion, right ankle: Secondary | ICD-10-CM | POA: Insufficient documentation

## 2023-04-15 DIAGNOSIS — M25571 Pain in right ankle and joints of right foot: Secondary | ICD-10-CM | POA: Diagnosis present

## 2023-04-15 MED ORDER — IBUPROFEN 400 MG PO TABS: 400.0000 mg | ORAL_TABLET | Freq: Three times a day (TID) | ORAL | 0 refills | Status: DC | PRN

## 2023-04-15 MED ORDER — HYDROCODONE-ACETAMINOPHEN 5-325 MG PO TABS: 1.0000 | ORAL_TABLET | ORAL | 0 refills | Status: DC | PRN

## 2023-04-15 MED ORDER — HYDROCODONE-ACETAMINOPHEN 5-325 MG PO TABS
1.0000 | ORAL_TABLET | Freq: Once | ORAL | Status: AC
  Administered 2023-04-15: 1 via ORAL
  Filled 2023-04-15: qty 1

## 2023-04-15 MED ORDER — IBUPROFEN 400 MG PO TABS
400.0000 mg | ORAL_TABLET | Freq: Once | ORAL | Status: AC
  Administered 2023-04-15: 400 mg via ORAL
  Filled 2023-04-15: qty 1

## 2023-04-15 NOTE — ED Provider Notes (Signed)
[place] EMERGENCY DEPARTMENT AT [facility] Provider Note   CSN: [id number] Arrival date & time: 04/15/23  1018     History  Chief Complaint  Patient presents with   Ankle Pain    Ricardo Evans is a 42 y.o. male.  HPI 42 year old male presents with right ankle swelling and pain.  History is from patient and wife.  A couple weeks ago, he had a similar presentation but not as bad.  He had been playing golf and when he got home his right ankle was hurting and swollen.  Played golf 2 days ago and after he got home noticed recurrent swelling and pain and then it seems to be getting worse.  He has not had a fever, redness.  No other specific injury.  He is having a hard time due to the pain.  Hurts to walk on it.  Home Medications Prior to Admission medications   Medication Sig Start Date End Date Taking? Authorizing Provider  HYDROcodone-acetaminophen (NORCO) 5-325 MG tablet Take 1 tablet by mouth every 4 (four) hours as needed for severe pain. 04/15/23  Yes Pricilla Loveless, MD  ibuprofen (ADVIL) 400 MG tablet Take 1 tablet (400 mg total) by mouth every 8 (eight) hours as needed. 04/15/23  Yes Pricilla Loveless, MD  carvedilol (COREG) 12.5 MG tablet Take 1 tablet (12.5 mg total) by mouth 2 (two) times daily with a meal. 09/06/22   Rollene Rotunda, MD  hydrALAZINE (APRESOLINE) 50 MG tablet TAKE 1 TABLET BY MOUTH THREE TIMES DAILY 04/03/23   Rollene Rotunda, MD  Multiple Vitamins-Minerals (MULTIVITAMIN WITH MINERALS) tablet Take 1 tablet by mouth daily. [employer]    [provider]  Olmesartan-amLODIPine-HCTZ 40-10-25 MG TABS Take 1 tablet by mouth once daily 07/27/22   Rollene Rotunda, MD      Allergies    Penicillins, Cephalexin, Sulfa antibiotics, and Sulfonamide derivatives    Review of Systems   Review of Systems  Constitutional:  Negative for fever.  Musculoskeletal:  Positive for arthralgias and joint swelling.  Neurological:  Negative for weakness and numbness.     Physical Exam Updated Vital Signs BP 105/82 (BP Location: Left Arm)   Pulse 82   Temp 97.8 F (36.6 C)   Resp 16   Ht 6' (1.829 m)   Wt 103.4 kg   SpO2 96%   BMI 30.92 kg/m  Physical Exam Vitals and nursing note reviewed.  Constitutional:      Appearance: He is well-developed.  HENT:     Head: Normocephalic and atraumatic.  Cardiovascular:     Rate and Rhythm: Normal rate and regular rhythm.     Pulses:          Dorsalis pedis pulses are 2+ on the right side.  Pulmonary:     Effort: Pulmonary effort is normal.  Musculoskeletal:     Right ankle: Swelling (right lateral) present. Tenderness present over the lateral malleolus.     Right Achilles Tendon: No tenderness or defects.     Comments: Tenderness and some mild swelling near lateral malleolus. No erythema, fluctuance.   Skin:    General: Skin is warm and dry.  Neurological:     Mental Status: He is alert.     ED Results / Procedures / Treatments   Labs (all labs ordered are listed, but only abnormal results are displayed) Labs Reviewed - No data to display  EKG None  Radiology DG Ankle Complete Right  Result Date: 04/15/2023 CLINICAL DATA:  non-traumatic  pain and swelling EXAM: RIGHT ANKLE - COMPLETE 3+ VIEW COMPARISON:  None Available. FINDINGS: There is no evidence of fracture or dislocation. Probable small tibiotalar joint effusion. Degenerative spurring within the lateral gutter. Mild talonavicular joint osteoarthritis. Generalized soft tissue swelling about the ankle. IMPRESSION: 1. Mild degenerative changes.  No acute osseous abnormality. 2. Generalized soft tissue swelling about the ankle. 3. Probable small tibiotalar joint effusion, nonspecific. Electronically Signed   By: Duanne Guess D.O.   On: 04/15/2023 10:55    Procedures Procedures    Medications Ordered in ED Medications  HYDROcodone-acetaminophen (NORCO/VICODIN) 5-325 MG per tablet 1 tablet (has no administration in time range)   ibuprofen (ADVIL) tablet 400 mg (has no administration in time range)    ED Course/ Medical Decision Making/ A&P                             Medical Decision Making Amount and/or Complexity of Data Reviewed Radiology: ordered and independent interpretation performed.    Details: No ankle fracture/bony abnormalities  Risk Prescription drug management.   Patient presents with right ankle pain.  Seems to be a recurrent issue over the last couple weeks.  I suspect this is from doing significant activity such as when he is golfing and walking around a lot on his feet.  Likely inflammatory.  He denies a specific history of gout.  X-ray was obtained and does not show any obvious bony abnormalities though there is a possible joint effusion that small.  I did briefly discuss risk/benefits of evaluating with arthrocentesis though at this point my clinical suspicion for septic arthritis is pretty low.  We decided to hold off together and treat conservatively with pain control and short course of NSAIDs.  Otherwise, we will have him follow-up with orthopedics.  Either way we will have him watch for worsening symptoms, redness, fever, etc.        Final Clinical Impression(s) / ED Diagnoses Final diagnoses:  Right ankle swelling    Rx / DC Orders ED Discharge Orders          Ordered    HYDROcodone-acetaminophen (NORCO) 5-325 MG tablet  Every 4 hours PRN        04/15/23 1113    ibuprofen (ADVIL) 400 MG tablet  Every 8 hours PRN        04/15/23 1113              Pricilla Loveless, MD 04/15/23 1118

## 2023-04-15 NOTE — ED Triage Notes (Signed)
He c/o non-traumatic right ankle pain and swelling, which is gradually worsening for about a week now.

## 2023-04-15 NOTE — Discharge Instructions (Addendum)
You likely have arthritis/inflammation to your ankle.  For now, treat conservatively with the ibuprofen prescribed and take the hydrocodone for breakthrough pain.  Be sure to elevate your leg, apply ice and use the compression wrap.  If at any point the pain seems to worsen, you notice more swelling, have fever, redness to the joint, or any other new/concerning symptoms then return to the ER or call 911.  Otherwise follow-up with the orthopedist.

## 2023-04-16 ENCOUNTER — Telehealth: Payer: Self-pay | Admitting: Orthopedic Surgery

## 2023-04-16 ENCOUNTER — Encounter: Payer: Self-pay | Admitting: Orthopedic Surgery

## 2023-04-16 ENCOUNTER — Ambulatory Visit: Payer: Commercial Managed Care - PPO | Admitting: Orthopedic Surgery

## 2023-04-16 DIAGNOSIS — M25571 Pain in right ankle and joints of right foot: Secondary | ICD-10-CM | POA: Diagnosis not present

## 2023-04-16 MED ORDER — PREDNISONE 5 MG (21) PO TBPK: ORAL_TABLET | ORAL | 0 refills | Status: DC

## 2023-04-16 NOTE — Progress Notes (Signed)
Office Visit Note   Patient: Ricardo Evans           Date of Birth: 01-08-1981           MRN: [id number] Visit Date: 04/16/2023 Requested by: [facility] [address] [place],  [place] [address] PCP: [facility]  Subjective: Chief Complaint  Patient presents with   Right Ankle - Pain    HPI: EMETT STAPEL is a 42 y.o. male who presents to the office reporting right ankle pain of 2 to 3 weeks duration.  No history of injury.  Hard for him to walk.  Pretty debilitating at times.  Has had 2 alter his work schedule because of the pain.  Not present all the time and he has had 1-2 episodes where the ankle was not painful.  No personal or family history of gout or pseudogout.  Patient describes swelling laterally.  He does use a brace and takes ibuprofen which helps some.  Biofreeze minimal relief.  He is getting to the point where it is difficult for him to weight-bear..                ROS: All systems reviewed are negative as they relate to the chief complaint within the history of present illness.  Patient denies fevers or chills.  Assessment & Plan: Visit Diagnoses:  1. Pain in right ankle and joints of right foot     Plan: Impression is right lateral ankle pain with diminished range of motion and focal swelling.  No history of trauma.  Radiographs unremarkable.  Differential would be gout versus pseudogout versus occult peroneal tendon rupture versus stress fracture of the distal fibula.  Plan is knee scooter prescription in 6-day Medrol Dosepak with MRI scan of the right ankle to evaluate this atraumatic lateral swelling which is pretty debilitating.  Follow-up after that study.  Follow-Up Instructions: No follow-ups on file.   Orders:  Orders Placed This Encounter  Procedures   MR Ankle Right w/o contrast   Meds ordered this encounter  Medications   predniSONE (STERAPRED UNI-PAK 21 TAB) 5 MG (21) TBPK tablet    Sig: Take dosepak as directed     Dispense:  21 tablet    Refill:  0      Procedures: No procedures performed   Clinical Data: No additional findings.  Objective: Vital Signs: There were no vitals taken for this visit.  Physical Exam:  Constitutional: Patient appears well-developed HEENT:  Head: Normocephalic Eyes:EOM are normal Neck: Normal range of motion Cardiovascular: Normal rate Pulmonary/chest: Effort normal Neurologic: Patient is alert Skin: Skin is warm Psychiatric: Patient has normal mood and affect  Ortho Exam: Ortho exam demonstrates palpable intact nontender anterior tib posterior tib Achilles tendon but a lot of tenderness and dysfunctional eversion strength on the lateral side of the right ankle.  Swelling is present in the sinus Tarsi region which is asymmetric right versus left.  Pedal pulses palpable.  Patient has diminished ankle dorsiflexion to about 5 degrees past neutral on the right compared to 20 on the left.  Also diminished subtalar range of motion right versus left with transverse tarsal motion is maintained.  No paresthesias on the dorsal or plantar aspect of the foot  Specialty Comments:  No specialty comments available.  Imaging: No results found.   PMFS History: Patient Active Problem List   Diagnosis Date Noted   Chronic systolic HF (heart failure) (HCC) 07/26/2021   Essential hypertension 06/29/2020  Abnormal CT lung screening 06/29/2020   Acute combined systolic and diastolic HF (heart failure) (HCC) 06/18/2020   Aortic root dilatation (HCC) 06/18/2020   Cardiomyopathy (HCC) 06/18/2020   Acute respiratory failure with hypoxemia (HCC) 05/31/2020   Hilar adenopathy 05/31/2020   Dyspnea and respiratory abnormalities 05/31/2020   Dyspnea 05/30/2020   Hypertensive urgency 05/30/2020   SPRAIN AND STRAIN OF METACARPOPHALANGEAL OF HAND 01/17/2010   Past Medical History:  Diagnosis Date   Hypertension    Hypertensive heart disease    Systolic HF (heart failure) (HCC)      Family History  Problem Relation Age of Onset   Suicidality Mother    COPD Father    Heart disease Maternal Grandmother    Diverticulitis Maternal Uncle    Cancer - Colon Neg Hx    Colon polyps Neg Hx     Past Surgical History:  Procedure Laterality Date   COLONOSCOPY WITH PROPOFOL N/A 04/05/2023   Procedure: COLONOSCOPY WITH PROPOFOL;  Surgeon: Corbin Ade, MD;  Location: [facility];  Service: Endoscopy;  Laterality: N/A;  7:30 AM, ASA 3   POLYPECTOMY  04/05/2023   Procedure: POLYPECTOMY;  Surgeon: Corbin Ade, MD;  Location: [facility];  Service: Endoscopy;;   SPIGELIAN HERNIA     TONSILLECTOMY AND ADENOIDECTOMY     Social History   Occupational History   Not on file  Tobacco Use   Smoking status: Never   Smokeless tobacco: Never  Vaping Use   Vaping Use: Never used  Substance and Sexual Activity   Alcohol use: Not Currently   Drug use: Never   Sexual activity: Yes

## 2023-04-16 NOTE — Telephone Encounter (Signed)
Pt would like to get MRI in [place] please advise

## 2023-04-17 NOTE — Telephone Encounter (Signed)
Referral was changed to [facility]

## 2023-05-02 ENCOUNTER — Ambulatory Visit: Payer: Commercial Managed Care - PPO | Admitting: Physician Assistant

## 2023-05-02 NOTE — Progress Notes (Deleted)
05/02/2023 Ricardo Evans [id number] 1981-08-06  Referring provider: [facility] Primary GI doctor: {acdocs:27040}  ASSESSMENT AND PLAN:   There are no diagnoses linked to this encounter.   Patient Care Team: [facility] as PCP - General Rollene Rotunda, MD as PCP - Cardiology (Cardiology)  HISTORY OF PRESENT ILLNESS: 42 y.o. male with a past medical history of hypertension, systolic and diastolic heart failure, repeat echo 2023 EF 60 to 65%, diverticulitis, hepatic steatosis seen on CT 01/2023 and others listed below presents for evaluation of abdominal pain.  02/11/2023 ER visit for left lower quadrant abdominal pain found to have uncomplicated diverticulitis treated with Cipro and Flagyl. CT [facility] showed mild-moderate descending colonic diverticulitis, mild hepatic steatosis, gallbladder unremarkable no biliary ductal dilation 04/05/2023 colonoscopy 4 mm polyp ascending colon diverticulosis tubular adenomatous polyp recall 7 years   He {Actions; denies-reports:120008} blood thinner use.  He {Actions; denies-reports:120008} NSAID use.  He {Actions; denies-reports:120008} ETOH use.   He {Actions; denies-reports:120008} tobacco use.  He {Actions; denies-reports:120008} drug use.    He  reports that he has never smoked. He has never used smokeless tobacco. He reports that he does not currently use alcohol. He reports that he does not use drugs.  RELEVANT LABS AND IMAGING: CBC    Component Value Date/Time   WBC 5.8 02/15/2023 1801   RBC 4.63 02/15/2023 1801   HGB 14.6 02/15/2023 1801   HCT 43.2 02/15/2023 1801   PLT 186 02/15/2023 1801   MCV 93.3 02/15/2023 1801   MCH 31.5 02/15/2023 1801   MCHC 33.8 02/15/2023 1801   RDW 13.4 02/15/2023 1801   LYMPHSABS 0.9 05/30/2020 1224   MONOABS 0.6 05/30/2020 1224   EOSABS 0.0 05/30/2020 1224   BASOSABS 0.0 05/30/2020 1224   Recent Labs    02/11/23 0724 02/15/23 1801  HGB 14.6 14.6    CMP      Component Value Date/Time   NA 137 04/03/2023 0822   K 3.9 04/03/2023 0822   CL 104 04/03/2023 0822   CO2 21 (L) 04/03/2023 0822   GLUCOSE 98 04/03/2023 0822   BUN 30 (H) 04/03/2023 0822   CREATININE 1.57 (H) 04/03/2023 0822   CALCIUM 9.0 04/03/2023 0822   PROT 8.1 02/15/2023 1801   ALBUMIN 4.2 02/15/2023 1801   AST 22 02/15/2023 1801   ALT 24 02/15/2023 1801   ALKPHOS 60 02/15/2023 1801   BILITOT 0.8 02/15/2023 1801   GFRNONAA 56 (L) 04/03/2023 0822   GFRAA >60 06/29/2020 1315      Latest Ref Rng & Units 02/15/2023    6:01 PM 02/11/2023    7:24 AM 05/30/2020   12:24 PM  Hepatic Function  Total Protein 6.5 - 8.1 g/dL 8.1  7.5  8.2   Albumin 3.5 - 5.0 g/dL 4.2  4.1  4.4   AST 15 - 41 U/L 22  14  24    ALT 0 - 44 U/L 24  20  23    Alk Phosphatase 38 - 126 U/L 60  67  85   Total Bilirubin 0.3 - 1.2 mg/dL 0.8  1.2  1.5       Current Medications:   Current Outpatient Medications (Endocrine & Metabolic):    predniSONE (STERAPRED UNI-PAK 21 TAB) 5 MG (21) TBPK tablet, Take dosepak as directed  Current Outpatient Medications (Cardiovascular):    carvedilol (COREG) 12.5 MG tablet, Take 1 tablet (12.5 mg total) by mouth 2 (two) times daily with a meal.   hydrALAZINE (APRESOLINE)  50 MG tablet, TAKE 1 TABLET BY MOUTH THREE TIMES DAILY   Olmesartan-amLODIPine-HCTZ 40-10-25 MG TABS, Take 1 tablet by mouth once daily   Current Outpatient Medications (Analgesics):    HYDROcodone-acetaminophen (NORCO) 5-325 MG tablet, Take 1 tablet by mouth every 4 (four) hours as needed for severe pain.   ibuprofen (ADVIL) 400 MG tablet, Take 1 tablet (400 mg total) by mouth every 8 (eight) hours as needed.   Current Outpatient Medications (Other):    Multiple Vitamins-Minerals (MULTIVITAMIN WITH MINERALS) tablet, Take 1 tablet by mouth daily. Mega Men  Medical History:  Past Medical History:  Diagnosis Date   Hypertension    Hypertensive heart disease    Systolic HF (heart failure) (HCC)     Allergies:  Allergies  Allergen Reactions   Penicillins Anaphylaxis   Cephalexin Rash   Sulfa Antibiotics Rash   Sulfonamide Derivatives Rash     Surgical History:  He  has a past surgical history that includes Spigelian hernia; Tonsillectomy and adenoidectomy; Colonoscopy with propofol (N/A, 04/05/2023); and polypectomy (04/05/2023). Family History:  His family history includes COPD in his father; Diverticulitis in his maternal uncle; Heart disease in his maternal grandmother; Suicidality in his mother.  REVIEW OF SYSTEMS  : All other systems reviewed and negative except where noted in the History of Present Illness.  PHYSICAL EXAM: There were no vitals taken for this visit. General Appearance: Well nourished, in no apparent distress. Head:   Normocephalic and atraumatic. Eyes:  sclerae anicteric,conjunctive pink  Respiratory: Respiratory effort normal, BS equal bilaterally without rales, rhonchi, wheezing. Cardio: RRR with no MRGs. Peripheral pulses intact.  Abdomen: Soft,  {BlankSingle:19197::"Flat","Obese","Non-distended"} ,active bowel sounds. {actendernessAB:27319} tenderness {anatomy; site abdomen:5010}. {BlankMultiple:19196::"Without guarding","With guarding","Without rebound","With rebound"}. No masses. Rectal: {acrectalexam:27461} Musculoskeletal: Full ROM, {PSY - GAIT AND STATION:22860} gait. {With/Without:304960234} edema. Skin:  Dry and intact without significant lesions or rashes Neuro: Alert and  oriented x4;  No focal deficits. Psych:  Cooperative. Normal mood and affect.    Doree Albee, PA-C 2:17 PM

## 2023-05-03 ENCOUNTER — Ambulatory Visit (HOSPITAL_BASED_OUTPATIENT_CLINIC_OR_DEPARTMENT_OTHER)
Admission: RE | Admit: 2023-05-03 | Discharge: 2023-05-03 | Disposition: A | Discharge status: Home / Self Care | Payer: Commercial Managed Care - PPO | Source: Ambulatory Visit | Attending: Orthopedic Surgery | Admitting: Orthopedic Surgery

## 2023-05-03 DIAGNOSIS — M25571 Pain in right ankle and joints of right foot: Secondary | ICD-10-CM | POA: Insufficient documentation

## 2023-05-04 ENCOUNTER — Ambulatory Visit: Payer: Commercial Managed Care - PPO | Admitting: Physician Assistant

## 2023-05-16 ENCOUNTER — Ambulatory Visit: Payer: Commercial Managed Care - PPO | Admitting: Orthopedic Surgery

## 2023-05-16 ENCOUNTER — Other Ambulatory Visit: Payer: Self-pay

## 2023-05-16 DIAGNOSIS — M659 Synovitis and tenosynovitis, unspecified: Secondary | ICD-10-CM | POA: Diagnosis not present

## 2023-05-16 DIAGNOSIS — M25571 Pain in right ankle and joints of right foot: Secondary | ICD-10-CM | POA: Diagnosis not present

## 2023-05-18 ENCOUNTER — Encounter: Payer: Self-pay | Admitting: Orthopedic Surgery

## 2023-05-18 MED ORDER — METHYLPREDNISOLONE ACETATE 40 MG/ML IJ SUSP
40.0000 mg | INTRAMUSCULAR | Status: AC | PRN
Start: 2023-05-16 — End: 2023-05-16
  Administered 2023-05-16: 40 mg via INTRA_ARTICULAR

## 2023-05-18 MED ORDER — BUPIVACAINE HCL 0.5 % IJ SOLN
1.0000 mL | INTRAMUSCULAR | Status: AC | PRN
Start: 2023-05-16 — End: 2023-05-16
  Administered 2023-05-16: 1 mL via INTRA_ARTICULAR

## 2023-05-18 MED ORDER — LIDOCAINE HCL 1 % IJ SOLN
3.0000 mL | INTRAMUSCULAR | Status: AC | PRN
Start: 2023-05-16 — End: 2023-05-16
  Administered 2023-05-16: 3 mL

## 2023-05-18 NOTE — Progress Notes (Signed)
Office Visit Note   Patient: Ricardo Evans           Date of Birth: 1981/07/06           MRN: [id number] Visit Date: 05/16/2023 Requested by: [facility] [address] [place],  [place] [address] PCP: [facility]  Subjective: Chief Complaint  Patient presents with   Other     Scan review    HPI: Ricardo Evans is a 42 y.o. male who presents to the office reporting right ankle pain.  Since he was last seen he has had an MRI scan which is reviewed.  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  There is a small bone fragment at the tip of the lateral malleolus with some edema in that region.  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  Cannot bend the ankle.  He denies any instability in the ankle.  Hard for him to get around.  Tried ibuprofen which has not been helpful.  Also tried prednisone which was not helpful..                ROS: All systems reviewed are negative as they relate to the chief complaint within the history of present illness.  Patient denies fevers or chills.  Assessment & Plan: Visit Diagnoses:  1. Pain in right ankle and joints of right foot     Plan: Impression is extremely painful range of motion of the right ankle.  This looks almost like a "frozen ankle".  Does have some pathology present with the ligaments in the ankle but it does not really match up with the symptoms.  Ultrasound injection performed in the ankle joint today.  Out of work Thursday and Friday.  Would like to get Dr. Lajoyce Corners to offer his opinion on exactly what is going on with the ankle.  Unclear based on exam what his symptoms are.  I do not know if any type of arthroscopic synovectomy would be indicated for the ankle but would like to get Dr. Audrie Lia opinion regarding options for surgical management if any in this ankle.  Plan to see him back after that second opinion if surgical management not indicated by Dr.  Lajoyce Corners. Follow-Up Instructions: No follow-ups on file.   Orders:  Orders Placed This Encounter  Procedures   US Guided Needle Placement - No Linked Charges   No orders of the defined types were placed in this encounter.     Procedures: Medium Joint Inj: L ankle on 05/16/2023 7:07 AM Indications: pain, joint swelling and diagnostic evaluation Details: 22 G 1.5 in needle, ultrasound-guided anteromedial approach Medications: 3 mL lidocaine 1 %; 1 mL bupivacaine 0.5 %; 40 mg methylPREDNISolone acetate 40 MG/ML (1/2 mL bupivacaine 0.5 %) Outcome: tolerated well, no immediate complications Procedure, treatment alternatives, risks and benefits explained, specific risks discussed. Consent was given by the patient. Immediately prior to procedure a time out was called to verify the correct patient, procedure, equipment, support staff and site/side marked as required. Patient was prepped and draped in the usual sterile fashion.       Clinical Data: No additional findings.  Objective: Vital Signs: There were no vitals taken for this visit.  Physical Exam:  Constitutional: Patient appears well-developed HEENT:  Head: Normocephalic Eyes:EOM are normal Neck: Normal range of motion Cardiovascular: Normal rate Pulmonary/chest: Effort normal Neurologic: Patient is alert Skin: Skin is warm Psychiatric: Patient has normal  mood and affect  Ortho Exam: Ortho exam demonstrates limited ankle dorsiflexion and plantarflexion on the right ankle compared to the left.  Does have palpable intact and functional anterior to posterior to peroneal and Achilles tendons.  Does have a fair amount of swelling around the sinus Tarsi region on the right compared to the left.  No skin color changes or temperature differences right ankle versus left ankle.  Pedal pulses palpable.  Sensation intact on the dorsal and plantar aspect of the foot.  Specialty Comments:  No specialty comments available.  Imaging: No  results found.   PMFS History: Patient Active Problem List   Diagnosis Date Noted   Chronic systolic HF (heart failure) (HCC) 07/26/2021   Essential hypertension 06/29/2020   Abnormal CT lung screening 06/29/2020   Acute combined systolic and diastolic HF (heart failure) (HCC) 06/18/2020   Aortic root dilatation (HCC) 06/18/2020   Cardiomyopathy (HCC) 06/18/2020   Acute respiratory failure with hypoxemia (HCC) 05/31/2020   Hilar adenopathy 05/31/2020   Dyspnea and respiratory abnormalities 05/31/2020   Dyspnea 05/30/2020   Hypertensive urgency 05/30/2020   SPRAIN AND STRAIN OF METACARPOPHALANGEAL OF HAND 01/17/2010   Past Medical History:  Diagnosis Date   Hypertension    Hypertensive heart disease    Systolic HF (heart failure) (HCC)     Family History  Problem Relation Age of Onset   Suicidality Mother    COPD Father    Heart disease Maternal Grandmother    Diverticulitis Maternal Uncle    Cancer - Colon Neg Hx    Colon polyps Neg Hx     Past Surgical History:  Procedure Laterality Date   COLONOSCOPY WITH PROPOFOL N/A 04/05/2023   Procedure: COLONOSCOPY WITH PROPOFOL;  Surgeon: Corbin Ade, MD;  [facility];  Service: Endoscopy;  Laterality: N/A;  7:30 AM, ASA 3   POLYPECTOMY  04/05/2023   Procedure: POLYPECTOMY;  Surgeon: Corbin Ade, MD;  Location: [facility];  Service: Endoscopy;;   SPIGELIAN HERNIA     TONSILLECTOMY AND ADENOIDECTOMY     Social History   Occupational History   Not on file  Tobacco Use   Smoking status: Never   Smokeless tobacco: Never  Vaping Use   Vaping status: Never Used  Substance and Sexual Activity   Alcohol use: Not Currently   Drug use: Never   Sexual activity: Yes

## 2023-05-21 ENCOUNTER — Ambulatory Visit: Payer: Commercial Managed Care - PPO | Admitting: Orthopedic Surgery

## 2023-05-22 ENCOUNTER — Ambulatory Visit: Payer: Commercial Managed Care - PPO | Admitting: Gastroenterology

## 2023-05-28 ENCOUNTER — Ambulatory Visit: Payer: Commercial Managed Care - PPO | Admitting: Orthopedic Surgery

## 2023-05-28 DIAGNOSIS — M659 Synovitis and tenosynovitis, unspecified: Secondary | ICD-10-CM

## 2023-05-28 DIAGNOSIS — M25571 Pain in right ankle and joints of right foot: Secondary | ICD-10-CM

## 2023-05-30 ENCOUNTER — Encounter: Payer: Self-pay | Admitting: Orthopedic Surgery

## 2023-05-30 NOTE — Progress Notes (Signed)
Office Visit Note   Patient: Ricardo Evans           Date of Birth: 1981-02-25           MRN: [id number] Visit Date: 05/28/2023              Requested by: Cammy Copa, MD [address] [place],  [place] [address] PCP: [facility]  Chief Complaint  Patient presents with   Right Ankle - Pain      HPI: Patient is a 42 year old gentleman who is seen for anterior right ankle pain.  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  He complains of decreased range of motion and swelling with activities.  Assessment & Plan: Visit Diagnoses:  1. Pain in right ankle and joints of right foot   2. Synovitis of right ankle     Plan: With patient's anterior impingement syndrome and no relief with injection and conservative treatment will plan for right ankle arthroscopy for debridement of the synovitis.  Risks and benefits were discussed including persistent pain need for additional surgery.  Patient states he understands wished to proceed at this time.  Follow-Up Instructions: No follow-ups on file.   Ortho Exam  Patient is alert, oriented, no adenopathy, well-dressed, normal affect, normal respiratory effort. Examination patient has a good dorsalis pedis pulse he has no pain to palpation of the anterior talofibular ligament the anterior drawer is stable there is no pain to palpation over the peroneal tendons and posterior tibial tendon and these have good function.  Patient is tender directly over the anterior joint line patient has had temporary relief with a previous steroid injection.  Review of the MRI scan shows no edema within the distal tibia or talus.  There is increased fluid anteriorly with synovitis.  Imaging: No results found. No images are attached to the encounter.  Labs: Lab Results  Component Value Date   ESRSEDRATE 27 (H) 05/31/2020     Lab Results   Component Value Date   ALBUMIN 4.2 02/15/2023   ALBUMIN 4.1 02/11/2023   ALBUMIN 4.4 05/30/2020    Lab Results  Component Value Date   MG 1.7 05/30/2020   No results found for: "VD25OH"  No results found for: "PREALBUMIN"    Latest Ref Rng & Units 02/15/2023    6:01 PM 02/11/2023    7:24 AM 05/30/2020   12:24 PM  CBC EXTENDED  WBC 4.0 - 10.5 K/uL 5.8  11.6  7.0   RBC 4.22 - 5.81 MIL/uL 4.63  4.58  4.61   Hemoglobin 13.0 - 17.0 g/dL 19.1  47.8  29.5   HCT 39.0 - 52.0 % 43.2  41.4  42.4   Platelets 150 - 400 K/uL 186  155  171   NEUT# 1.7 - 7.7 K/uL   5.4   Lymph# 0.7 - 4.0 K/uL   0.9      There is no height or weight on file to calculate BMI.  Orders:  No orders of the defined types were placed in this encounter.  No orders of the defined types were placed in this encounter.    Procedures: No procedures performed  Clinical Data: No additional findings.  ROS:  All other systems negative, except as noted in the HPI. Review of Systems  Objective: Vital Signs: There were no vitals taken for this visit.  Specialty Comments:  No specialty comments available.  PMFS History: Patient Active Problem List   Diagnosis Date Noted   Chronic systolic HF (heart failure) (HCC) 07/26/2021   Essential hypertension 06/29/2020   Abnormal CT lung screening 06/29/2020   Acute combined systolic and diastolic HF (heart failure) (HCC) 06/18/2020   Aortic root dilatation (HCC) 06/18/2020   Cardiomyopathy (HCC) 06/18/2020   Acute respiratory failure with hypoxemia (HCC) 05/31/2020   Hilar adenopathy 05/31/2020   Dyspnea and respiratory abnormalities 05/31/2020   Dyspnea 05/30/2020   Hypertensive urgency 05/30/2020   SPRAIN AND STRAIN OF METACARPOPHALANGEAL OF HAND 01/17/2010   Past Medical History:  Diagnosis Date   Hypertension    Hypertensive heart disease    Systolic HF (heart failure) (HCC)     Family History  Problem Relation Age of Onset   Suicidality Mother     COPD Father    Heart disease Maternal Grandmother    Diverticulitis Maternal Uncle    Cancer - Colon Neg Hx    Colon polyps Neg Hx     Past Surgical History:  Procedure Laterality Date   COLONOSCOPY WITH PROPOFOL N/A 04/05/2023   Procedure: COLONOSCOPY WITH PROPOFOL;  Surgeon: Corbin Ade, MD;  [facility];  Service: Endoscopy;  Laterality: N/A;  7:30 AM, ASA 3   POLYPECTOMY  04/05/2023   Procedure: POLYPECTOMY;  Surgeon: Corbin Ade, MD;  [facility];  Service: Endoscopy;;   SPIGELIAN HERNIA     TONSILLECTOMY AND ADENOIDECTOMY     Social History   Occupational History   Not on file  Tobacco Use   Smoking status: Never   Smokeless tobacco: Never  Vaping Use   Vaping status: Never Used  Substance and Sexual Activity   Alcohol use: Not Currently   Drug use: Never   Sexual activity: Yes

## 2023-05-31 ENCOUNTER — Telehealth: Payer: Self-pay | Admitting: Pulmonary Disease

## 2023-05-31 DIAGNOSIS — R0902 Hypoxemia: Secondary | ICD-10-CM

## 2023-05-31 DIAGNOSIS — G4734 Idiopathic sleep related nonobstructive alveolar hypoventilation: Secondary | ICD-10-CM

## 2023-06-01 NOTE — Telephone Encounter (Signed)
Patient is requesting to d/c nocturnal O2.  LOV 09/20/22, ROV in 37yr  Please advise, thank you!

## 2023-06-01 NOTE — Telephone Encounter (Signed)
He is supposed to be using supplemental oxygen at night.  Would prefer to do overnight oximetry on room air to make sure he doesn't still oxygen therapy at night.  If he is okay with this plan, then please place order for overnight oximetry on room air and use nocturnal hypoxemia for diagnosis code.

## 2023-06-08 NOTE — Telephone Encounter (Signed)
Pt. Calling back dosen't want O2 anymore please advise to get D/C order to [facility] to pick up tanks he doesn't want to pay for them anymore

## 2023-06-08 NOTE — Telephone Encounter (Signed)
Spoke with paitent and advised of Dr Craige Cotta recommendations.  Order for ONO placed.

## 2023-06-21 ENCOUNTER — Other Ambulatory Visit: Payer: Self-pay | Admitting: Cardiology

## 2023-06-22 ENCOUNTER — Encounter (HOSPITAL_COMMUNITY): Payer: Self-pay | Admitting: Orthopedic Surgery

## 2023-06-22 NOTE — Progress Notes (Signed)
[facility] - [facility] Cardiologist - Rollene Rotunda, MD Renal - Manpreet Bhutani with [facility]    EKG - 02/2022.  Needs repeat on DOS Chest x-ray - 06/20/2022 ECHO - 04/28/2022 Cardiac Cath - Denies  Sleep Study- Yes CPAP - No.   DM= Denies   Blood Thinner Instructions: Denies Aspirin Instructions: Denies  ERAS Protcol - NPO COVID TEST- N/A  Anesthesia review: Yes.  Hx HTN,   -------------  SDW INSTRUCTIONS:  Your procedure is scheduled on Wednesday September 4th. Please report to [facility] Main Entrance "A" at 0800 A.M., and check in at the Admitting office. Call this number if you have problems the morning of surgery: [phone]   Remember: Do not eat or drink after midnight the night before your surgery     Medications to take morning of surgery with a sip of water include: carvedilol (COREG)   IF NEEDED traMADol (ULTRAM)   As of today, STOP taking any Aspirin (unless otherwise instructed by your surgeon), Aleve, Naproxen, Ibuprofen, Motrin, Advil, Goody's, BC's, all herbal medications, fish oil, and all vitamins.    The Morning of Surgery Do not wear jewelry, make-up or nail polish. Do not wear lotions, powders, or perfumes/colognes, or deodorant Do not bring valuables to the hospital. [facility] is not responsible for any belongings or valuables.  If you are a smoker, DO NOT Smoke 24 hours prior to surgery  If you wear a CPAP at night please bring your mask the morning of surgery   Remember that you must have someone to transport you home after your surgery, and remain with you for 24 hours if you are discharged the same day.  Please bring cases for contacts, glasses, hearing aids, dentures or bridgework because it cannot be worn into surgery.   Patients discharged the day of surgery will not be allowed to drive home.   Please shower the NIGHT BEFORE/MORNING OF SURGERY (use antibacterial soap like DIAL soap if possible). Wear  comfortable clothes the morning of surgery. Oral Hygiene is also important to reduce your risk of infection.  Remember - BRUSH YOUR TEETH THE MORNING OF SURGERY WITH YOUR REGULAR TOOTHPASTE  Patient denies shortness of breath, fever, cough and chest pain.

## 2023-06-26 MED ORDER — VANCOMYCIN HCL 1500 MG/300ML IV SOLN
1500.0000 mg | INTRAVENOUS | Status: AC
  Administered 2023-06-27: 1500 mg via INTRAVENOUS
  Filled 2023-06-26 (×2): qty 300

## 2023-06-27 ENCOUNTER — Encounter (HOSPITAL_COMMUNITY): Payer: Self-pay | Admitting: Orthopedic Surgery

## 2023-06-27 ENCOUNTER — Ambulatory Visit (HOSPITAL_COMMUNITY): Payer: Commercial Managed Care - PPO | Admitting: Certified Registered Nurse Anesthetist

## 2023-06-27 ENCOUNTER — Encounter (HOSPITAL_COMMUNITY)
Admission: RE | Disposition: A | Discharge status: Home / Self Care | Payer: Self-pay | Source: Home / Self Care | Attending: Orthopedic Surgery

## 2023-06-27 ENCOUNTER — Other Ambulatory Visit: Payer: Self-pay

## 2023-06-27 ENCOUNTER — Ambulatory Visit (HOSPITAL_COMMUNITY)
Admission: RE | Admit: 2023-06-27 | Discharge: 2023-06-27 | Disposition: A | Discharge status: Home / Self Care | Payer: Commercial Managed Care - PPO | Attending: Orthopedic Surgery | Admitting: Orthopedic Surgery

## 2023-06-27 ENCOUNTER — Ambulatory Visit (HOSPITAL_BASED_OUTPATIENT_CLINIC_OR_DEPARTMENT_OTHER): Payer: Commercial Managed Care - PPO | Admitting: Certified Registered Nurse Anesthetist

## 2023-06-27 DIAGNOSIS — M25871 Other specified joint disorders, right ankle and foot: Secondary | ICD-10-CM

## 2023-06-27 DIAGNOSIS — M24171 Other articular cartilage disorders, right ankle: Secondary | ICD-10-CM

## 2023-06-27 DIAGNOSIS — I5022 Chronic systolic (congestive) heart failure: Secondary | ICD-10-CM | POA: Insufficient documentation

## 2023-06-27 DIAGNOSIS — I110 Hypertensive heart disease with heart failure: Secondary | ICD-10-CM | POA: Insufficient documentation

## 2023-06-27 HISTORY — PX: ANKLE ARTHROSCOPY: SHX545

## 2023-06-27 LAB — BASIC METABOLIC PANEL
Anion gap: 12 (ref 5–15)
BUN: 15 mg/dL (ref 6–20)
CO2: 23 mmol/L (ref 22–32)
Calcium: 9.3 mg/dL (ref 8.9–10.3)
Chloride: 103 mmol/L (ref 98–111)
Creatinine, Ser: 1.06 mg/dL (ref 0.61–1.24)
GFR, Estimated: >60 mL/min (ref >60)
Glucose, Bld: 102 mg/dL — ABNORMAL HIGH (ref 70–99)
Potassium: 3.9 mmol/L (ref 3.5–5.1)
Sodium: 138 mmol/L (ref 135–145)

## 2023-06-27 LAB — CBC
HCT: 48.1 % (ref 39.0–52.0)
Hemoglobin: 16.1 g/dL (ref 13.0–17.0)
MCH: 30.8 pg (ref 26.0–34.0)
MCHC: 33.5 g/dL (ref 30.0–36.0)
MCV: 92.1 fL (ref 80.0–100.0)
Platelets: 148 10*3/uL — ABNORMAL LOW (ref 150–400)
RBC: 5.22 MIL/uL (ref 4.22–5.81)
RDW: 13.8 % (ref 11.5–15.5)
WBC: 6.9 10*3/uL (ref 4.0–10.5)
nRBC: 0 % (ref 0.0–0.2)

## 2023-06-27 SURGERY — ARTHROSCOPY, ANKLE
Anesthesia: General | Site: Ankle | Laterality: Right

## 2023-06-27 MED ORDER — DEXAMETHASONE SODIUM PHOSPHATE 10 MG/ML IJ SOLN
INTRAMUSCULAR | Status: AC
  Filled 2023-06-27: qty 1

## 2023-06-27 MED ORDER — SODIUM CHLORIDE 0.9 % IR SOLN
Status: DC | PRN
  Administered 2023-06-27: 6000 mL

## 2023-06-27 MED ORDER — 0.9 % SODIUM CHLORIDE (POUR BTL) OPTIME
TOPICAL | Status: DC | PRN
Start: 2023-06-27 — End: 2023-06-27
  Administered 2023-06-27: 1000 mL

## 2023-06-27 MED ORDER — LIDOCAINE 2% (20 MG/ML) 5 ML SYRINGE
INTRAMUSCULAR | Status: AC
  Filled 2023-06-27: qty 5

## 2023-06-27 MED ORDER — LACTATED RINGERS IV SOLN: INTRAVENOUS | Status: DC

## 2023-06-27 MED ORDER — FENTANYL CITRATE (PF) 100 MCG/2ML IJ SOLN
INTRAMUSCULAR | Status: AC
  Filled 2023-06-27: qty 2

## 2023-06-27 MED ORDER — ACETAMINOPHEN 10 MG/ML IV SOLN
INTRAVENOUS | Status: AC
  Filled 2023-06-27: qty 100

## 2023-06-27 MED ORDER — ONDANSETRON HCL 4 MG/2ML IJ SOLN
INTRAMUSCULAR | Status: DC | PRN
  Administered 2023-06-27: 4 mg via INTRAVENOUS

## 2023-06-27 MED ORDER — CHLORHEXIDINE GLUCONATE 0.12 % MT SOLN
15.0000 mL | Freq: Once | OROMUCOSAL | Status: AC
  Administered 2023-06-27: 15 mL via OROMUCOSAL
  Filled 2023-06-27: qty 15

## 2023-06-27 MED ORDER — PROPOFOL 10 MG/ML IV BOLUS
INTRAVENOUS | Status: AC
  Filled 2023-06-27: qty 20

## 2023-06-27 MED ORDER — ONDANSETRON HCL 4 MG/2ML IJ SOLN: 4.0000 mg | Freq: Once | INTRAMUSCULAR | Status: DC | PRN

## 2023-06-27 MED ORDER — MIDAZOLAM HCL 2 MG/2ML IJ SOLN
INTRAMUSCULAR | Status: DC | PRN
  Administered 2023-06-27: 2 mg via INTRAVENOUS

## 2023-06-27 MED ORDER — MIDAZOLAM HCL 2 MG/2ML IJ SOLN
INTRAMUSCULAR | Status: AC
  Filled 2023-06-27: qty 2

## 2023-06-27 MED ORDER — LIDOCAINE 2% (20 MG/ML) 5 ML SYRINGE
INTRAMUSCULAR | Status: DC | PRN
  Administered 2023-06-27: 100 mg via INTRAVENOUS

## 2023-06-27 MED ORDER — LEVOFLOXACIN IN D5W 500 MG/100ML IV SOLN
500.0000 mg | INTRAVENOUS | Status: AC
  Administered 2023-06-27: 500 mg via INTRAVENOUS
  Filled 2023-06-27: qty 100

## 2023-06-27 MED ORDER — ACETAMINOPHEN 10 MG/ML IV SOLN
1000.0000 mg | Freq: Once | INTRAVENOUS | Status: DC | PRN
  Administered 2023-06-27: 1000 mg via INTRAVENOUS

## 2023-06-27 MED ORDER — ONDANSETRON HCL 4 MG/2ML IJ SOLN
INTRAMUSCULAR | Status: AC
  Filled 2023-06-27: qty 2

## 2023-06-27 MED ORDER — PROPOFOL 10 MG/ML IV BOLUS
INTRAVENOUS | Status: DC | PRN
  Administered 2023-06-27: 150 mg via INTRAVENOUS
  Administered 2023-06-27 (×2): 50 mg via INTRAVENOUS

## 2023-06-27 MED ORDER — OXYCODONE HCL 5 MG/5ML PO SOLN: 5.0000 mg | Freq: Once | ORAL | Status: AC | PRN

## 2023-06-27 MED ORDER — DEXAMETHASONE SODIUM PHOSPHATE 4 MG/ML IJ SOLN
INTRAMUSCULAR | Status: DC | PRN
Start: 2023-06-27 — End: 2023-06-27
  Administered 2023-06-27: 10 mg via INTRAVENOUS

## 2023-06-27 MED ORDER — OXYCODONE-ACETAMINOPHEN 5-325 MG PO TABS
1.0000 | ORAL_TABLET | ORAL | 0 refills | Status: DC | PRN
Start: 2023-06-27 — End: 2023-12-20

## 2023-06-27 MED ORDER — ORAL CARE MOUTH RINSE: 15.0000 mL | Freq: Once | OROMUCOSAL | Status: AC

## 2023-06-27 MED ORDER — OXYCODONE HCL 5 MG PO TABS
5.0000 mg | ORAL_TABLET | Freq: Once | ORAL | Status: AC | PRN
  Administered 2023-06-27: 5 mg via ORAL

## 2023-06-27 MED ORDER — OXYCODONE HCL 5 MG PO TABS
ORAL_TABLET | ORAL | Status: AC
  Filled 2023-06-27: qty 1

## 2023-06-27 MED ORDER — FENTANYL CITRATE (PF) 100 MCG/2ML IJ SOLN
25.0000 ug | INTRAMUSCULAR | Status: DC | PRN
  Administered 2023-06-27 (×3): 50 ug via INTRAVENOUS

## 2023-06-27 MED ORDER — FENTANYL CITRATE (PF) 250 MCG/5ML IJ SOLN
INTRAMUSCULAR | Status: DC | PRN
  Administered 2023-06-27: 50 ug via INTRAVENOUS

## 2023-06-27 MED ORDER — CARVEDILOL 12.5 MG PO TABS
ORAL_TABLET | ORAL | Status: AC
  Filled 2023-06-27: qty 1

## 2023-06-27 MED ORDER — FENTANYL CITRATE (PF) 250 MCG/5ML IJ SOLN
INTRAMUSCULAR | Status: AC
  Filled 2023-06-27: qty 5

## 2023-06-27 MED ORDER — CARVEDILOL 12.5 MG PO TABS
12.5000 mg | ORAL_TABLET | Freq: Once | ORAL | Status: AC
  Administered 2023-06-27: 12.5 mg via ORAL

## 2023-06-27 SURGICAL SUPPLY — 42 items
BAG COUNTER SPONGE SURGICOUNT (BAG) ×1 IMPLANT
BAG SPNG CNTER NS LX DISP (BAG) ×1
BLADE CUDA 5.5 (BLADE) IMPLANT
BLADE EXCALIBUR 4.0X13 (MISCELLANEOUS) IMPLANT
BNDG GAUZE DERMACEA FLUFF 4 (GAUZE/BANDAGES/DRESSINGS) IMPLANT
BNDG GZE DERMACEA 4 6PLY (GAUZE/BANDAGES/DRESSINGS) ×1
BUR OVAL 4.0 (BURR) IMPLANT
COVER SURGICAL LIGHT HANDLE (MISCELLANEOUS) ×2 IMPLANT
CUFF TOURN SGL QUICK 34 (TOURNIQUET CUFF)
CUFF TOURN SGL QUICK 42 (TOURNIQUET CUFF) IMPLANT
CUFF TRNQT CYL 34X4.125X (TOURNIQUET CUFF) IMPLANT
DRAPE ARTHROSCOPY W/POUCH 114 (DRAPES) ×1 IMPLANT
DRAPE OEC MINIVIEW 54X84 (DRAPES) IMPLANT
DRAPE U-SHAPE 47X51 STRL (DRAPES) ×1 IMPLANT
DRSG EMULSION OIL 3X3 NADH (GAUZE/BANDAGES/DRESSINGS) ×1 IMPLANT
DURAPREP 26ML APPLICATOR (WOUND CARE) ×1 IMPLANT
GAUZE PAD ABD 8X10 STRL (GAUZE/BANDAGES/DRESSINGS) ×1 IMPLANT
GAUZE SPONGE 4X4 12PLY STRL (GAUZE/BANDAGES/DRESSINGS) ×1 IMPLANT
GLOVE BIOGEL PI IND STRL 9 (GLOVE) ×1 IMPLANT
GLOVE SURG ORTHO 9.0 STRL STRW (GLOVE) ×1 IMPLANT
GOWN STRL REUS W/ TWL XL LVL3 (GOWN DISPOSABLE) ×3 IMPLANT
GOWN STRL REUS W/TWL XL LVL3 (GOWN DISPOSABLE) ×3
KIT BASIN OR (CUSTOM PROCEDURE TRAY) ×1 IMPLANT
KIT TURNOVER KIT B (KITS) ×1 IMPLANT
MANIFOLD NEPTUNE II (INSTRUMENTS) ×1 IMPLANT
NDL 18GX1X1/2 (RX/OR ONLY) (NEEDLE) ×1 IMPLANT
NEEDLE 18GX1X1/2 (RX/OR ONLY) (NEEDLE) ×1 IMPLANT
PACK ARTHROSCOPY DSU (CUSTOM PROCEDURE TRAY) ×1 IMPLANT
PAD ARMBOARD 7.5X6 YLW CONV (MISCELLANEOUS) ×2 IMPLANT
PADDING CAST COTTON 6X4 STRL (CAST SUPPLIES) ×1 IMPLANT
PORT APPOLLO RF 90DEGREE MULTI (SURGICAL WAND) IMPLANT
SPONGE T-LAP 4X18 ~~LOC~~+RFID (SPONGE) ×1 IMPLANT
SUT ETHILON 2 0 PSLX (SUTURE) IMPLANT
SUT ETHILON 4 0 PS 2 18 (SUTURE) ×1 IMPLANT
SUT MNCRL AB 3-0 PS2 18 (SUTURE) IMPLANT
SUT VIC AB 2-0 CT1 27 (SUTURE)
SUT VIC AB 2-0 CT1 TAPERPNT 27 (SUTURE) IMPLANT
SYR 20ML LL LF (SYRINGE) ×1 IMPLANT
TOWEL GREEN STERILE (TOWEL DISPOSABLE) ×1 IMPLANT
TOWEL GREEN STERILE FF (TOWEL DISPOSABLE) ×1 IMPLANT
TUBING ARTHROSCOPY IRRIG 16FT (MISCELLANEOUS) ×1 IMPLANT
WATER STERILE IRR 1000ML POUR (IV SOLUTION) ×1 IMPLANT

## 2023-06-27 NOTE — Anesthesia Procedure Notes (Signed)
Procedure Name: LMA Insertion Date/Time: 06/27/2023 10:26 AM  Performed by: Shary Decamp, CRNAPre-anesthesia Checklist: Patient identified, Emergency Drugs available, Suction available and Patient being monitored Patient Re-evaluated:Patient Re-evaluated prior to induction Oxygen Delivery Method: Circle system utilized Preoxygenation: Pre-oxygenation with 100% oxygen Induction Type: IV induction Ventilation: Mask ventilation without difficulty LMA: LMA flexible inserted LMA Size: 5.0 Number of attempts: 1 Placement Confirmation: positive ETCO2 and breath sounds checked- equal and bilateral Tube secured with: Tape Dental Injury: Teeth and Oropharynx as per pre-operative assessment

## 2023-06-27 NOTE — Anesthesia Postprocedure Evaluation (Signed)
Anesthesia Post Note  Patient: Ricardo Evans  Procedure(s) Performed: RIGHT ANKLE ARTHROSCOPY AND DEBRIDEMENT (Right: Ankle)     Patient location during evaluation: PACU Anesthesia Type: General Level of consciousness: awake and alert Pain management: pain level controlled Vital Signs Assessment: post-procedure vital signs reviewed and stable Respiratory status: spontaneous breathing, nonlabored ventilation, respiratory function stable and patient connected to nasal cannula oxygen Cardiovascular status: blood pressure returned to baseline and stable Postop Assessment: no apparent nausea or vomiting Anesthetic complications: no   No notable events documented.  Last Vitals:  Vitals:   06/27/23 1115 06/27/23 1130  BP: (!) 138/100 (!) 139/98  Pulse: 75 70  Resp: 17 11  Temp:  36.4 C  SpO2: 97% 96%    Last Pain:  Vitals:   06/27/23 1130  PainSc: 0-No pain                 Mariann Barter

## 2023-06-27 NOTE — Anesthesia Preprocedure Evaluation (Signed)
Anesthesia Evaluation  Patient identified by MRN, date of birth, ID band Patient awake    Reviewed: Allergy & Precautions, NPO status , Patient's Chart, lab work & pertinent test results, reviewed documented beta blocker date and time   History of Anesthesia Complications Negative for: history of anesthetic complications  Airway Mallampati: II  TM Distance: >3 FB     Dental no notable dental hx.    Pulmonary neg shortness of breath, sleep apnea and Oxygen sleep apnea , neg COPD   breath sounds clear to auscultation       Cardiovascular hypertension, (-) angina +CHF  (-) CAD, (-) Past MI, (-) Cardiac Stents and (-) CABG  Rhythm:Regular Rate:Normal  Previously with systolic heart failure, EF now recovered   Neuro/Psych neg Seizures    GI/Hepatic ,neg GERD  ,,(+) neg Cirrhosis        Endo/Other    Renal/GU Renal disease     Musculoskeletal   Abdominal   Peds  Hematology  (+) Blood dyscrasia (mild thrombocytopenia)   Anesthesia Other Findings   Reproductive/Obstetrics                              Anesthesia Physical Anesthesia Plan  ASA: 2  Anesthesia Plan: General   Post-op Pain Management:    Induction: Intravenous  PONV Risk Score and Plan: 2 and Ondansetron and Dexamethasone  Airway Management Planned: LMA  Additional Equipment:   Intra-op Plan:   Post-operative Plan: Extubation in OR  Informed Consent: I have reviewed the patients History and Physical, chart, labs and discussed the procedure including the risks, benefits and alternatives for the proposed anesthesia with the patient or authorized representative who has indicated his/her understanding and acceptance.     Dental advisory given  Plan Discussed with: CRNA  Anesthesia Plan Comments:          Anesthesia Quick Evaluation

## 2023-06-27 NOTE — Transfer of Care (Signed)
Immediate Anesthesia Transfer of Care Note  Patient: NAKI WAX  Procedure(s) Performed: RIGHT ANKLE ARTHROSCOPY AND DEBRIDEMENT (Right: Ankle)  Patient Location: PACU  Anesthesia Type:General  Level of Consciousness: awake, alert , oriented, patient cooperative, and responds to stimulation  Airway & Oxygen Therapy: Patient Spontanous Breathing and Patient connected to face mask oxygen  Post-op Assessment: Report given to RN, Post -op Vital signs reviewed and stable, and Patient moving all extremities X 4  Post vital signs: Reviewed and stable  Last Vitals:  Vitals Value Taken Time  BP 144/102 06/27/23 1106  Temp    Pulse 76 06/27/23 1108  Resp 20 06/27/23 1108  SpO2 97 % 06/27/23 1108  Vitals shown include unfiled device data.  Last Pain:  Vitals:   06/27/23 0817  PainSc: 0-No pain         Complications: No notable events documented.

## 2023-06-27 NOTE — Interval H&P Note (Signed)
History and Physical Interval Note:  06/27/2023 9:45 AM  Ricardo Evans  has presented today for surgery, with the diagnosis of Impingement Right Ankle.  The various methods of treatment have been discussed with the patient and family. After consideration of risks, benefits and other options for treatment, the patient has consented to  Procedure(s): RIGHT ANKLE ARTHROSCOPY AND DEBRIDEMENT (Right) as a surgical intervention.  The patient's history has been reviewed, patient examined, no change in status, stable for surgery.  I have reviewed the patient's chart and labs.  Questions were answered to the patient's satisfaction.     Ricardo Evans

## 2023-06-27 NOTE — Op Note (Signed)
06/27/2023  11:11 AM  PATIENT:  Ricardo Evans    PRE-OPERATIVE DIAGNOSIS:  Impingement Right Ankle  POST-OPERATIVE DIAGNOSIS:  Same  PROCEDURE:  RIGHT ANKLE ARTHROSCOPY AND DEBRIDEMENT  SURGEON:  Nadara Mustard, MD  PHYSICIAN ASSISTANT:None ANESTHESIA:   General  PREOPERATIVE INDICATIONS:  Ricardo Evans is a  42 y.o. male with a diagnosis of Impingement Right Ankle who failed conservative measures and elected for surgical management.    The risks benefits and alternatives were discussed with the patient preoperatively including but not limited to the risks of infection, bleeding, nerve injury, cardiopulmonary complications, the need for revision surgery, among others, and the patient was willing to proceed.  OPERATIVE IMPLANTS:   * No implants in log *  @ENCIMAGES @  OPERATIVE FINDINGS: Patient had extensive synovitis with flap tears of the articular cartilage of the talus in multiple areas.  OPERATIVE PROCEDURE: Patient brought the operating room and underwent a general anesthetic.  After adequate levels anesthesia obtained patient's right lower extremity was prepped using DuraPrep draped into a sterile field a timeout was called.  The scope was inserted through the anterior medial portal and working portal was established anterior laterally.  The skin was incised blunt dissection was carried down through the skin to the joint fascia and a blunt trocar was used inserted into the joint for the arthroscopic portal.  With light from the arthroscopy visualization was used to place the anterior lateral portal.  18-gauge needle was inserted skin was incised blunt dissection was carried down to the capsule and a blunt trocar was used and inserted into the capsule.  There was extensive synovitis that filled the joint the electrical wand was used to initially start debriding the synovitis the shaver was then used once the joint was visualized.  The medial lateral gutters were cleansed anterior was  cleansed there is flap tearing anteriorly along the talus and this was debrided with a shaver.  Electrocautery was used for further debridement and hemostasis.  A survey of all compartments showed there to be no loose bodies.  The answer was removed portals closed using 2-0 nylon a sterile dressing was applied.  Patient was extubated taken the PACU in stable condition.   DISCHARGE PLANNING:  Antibiotic duration: Preoperative antibiotics  Weightbearing: Weightbearing as tolerated  Pain medication: Prescription for Percocet  Dressing care/ Wound VAC: Dry dressing change in 3 days  Ambulatory devices: Crutches and postoperative shoe  Discharge to: Home.  Follow-up: In the office 1 week post operative.

## 2023-06-27 NOTE — Progress Notes (Signed)
Orthopedic Tech Progress Note Patient Details:  Ricardo Evans [birth year] [id number]  PACU RN called requesting a POST OP SHOE for MD DUDA. Applied while in PACU  . Ortho Devices Type of Ortho Device: Postop shoe/boot Ortho Device/Splint Location: RLE Ortho Device/Splint Interventions: Ordered, Application, Adjustment   Post Interventions Patient Tolerated: Well Instructions Provided: Care of device  Donald Pore 06/27/2023, 11:43 AM

## 2023-06-27 NOTE — H&P (Signed)
KAIRAN KHIM is an 42 y.o. male.   Chief Complaint: Chronic right ankle pain. HPI: Patient is a 42 year old gentleman who is seen for anterior right ankle pain. 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 He complains of decreased range of motion and swelling with activities.   Past Medical History:  Diagnosis Date   Hypertension    Hypertensive heart disease    Systolic HF (heart failure) (HCC)     Past Surgical History:  Procedure Laterality Date   COLONOSCOPY WITH PROPOFOL N/A 04/05/2023   Procedure: COLONOSCOPY WITH PROPOFOL;  Surgeon: Corbin Ade, MD;  [facility];  Service: Endoscopy;  Laterality: N/A;  7:30 AM, ASA 3   POLYPECTOMY  04/05/2023   Procedure: POLYPECTOMY;  Surgeon: Corbin Ade, MD;  Location: [facility];  Service: Endoscopy;;   SPIGELIAN HERNIA     TONSILLECTOMY AND ADENOIDECTOMY      Family History  Problem Relation Age of Onset   Suicidality Mother    COPD Father    Heart disease Maternal Grandmother    Diverticulitis Maternal Uncle    Cancer - Colon Neg Hx    Colon polyps Neg Hx    Social History:  reports that he has never smoked. He has never used smokeless tobacco. He reports that he does not currently use alcohol. He reports that he does not use drugs.  Allergies:  Allergies  Allergen Reactions   Penicillins Anaphylaxis   Cephalexin Rash   Sulfa Antibiotics Rash   Sulfonamide Derivatives Rash    No medications prior to admission.    No results found for this or any previous visit (from the past 48 hour(s)). No results found.  Review of Systems  All other systems reviewed and are negative.   Height 6' (1.829 m), weight 103.4 kg. Physical Exam  Patient is alert, oriented, no adenopathy, well-dressed, normal affect, normal respiratory effort. Examination patient has a good dorsalis pedis pulse he has no pain to palpation of the  anterior talofibular ligament the anterior drawer is stable there is no pain to palpation over the peroneal tendons and posterior tibial tendon and these have good function.  Patient is tender directly over the anterior joint line patient has had temporary relief with a previous steroid injection.   Review of the MRI scan shows no edema within the distal tibia or talus.  There is increased fluid anteriorly with synovitis. Assessment/Plan 1. Pain in right ankle and joints of right foot   2. Synovitis of right ankle       Plan: With patient's anterior impingement syndrome and no relief with injection and conservative treatment will plan for right ankle arthroscopy for debridement of the synovitis.  Risks and benefits were discussed including persistent pain need for additional surgery.  Patient states he understands wished to proceed at this time.  Nadara Mustard, MD 06/27/2023, 6:59 AM

## 2023-06-28 ENCOUNTER — Other Ambulatory Visit: Payer: Self-pay

## 2023-06-28 ENCOUNTER — Encounter (HOSPITAL_COMMUNITY): Payer: Self-pay | Admitting: Orthopedic Surgery

## 2023-06-28 ENCOUNTER — Telehealth: Payer: Self-pay | Admitting: Orthopedic Surgery

## 2023-06-28 ENCOUNTER — Encounter: Payer: Self-pay | Admitting: Orthopedic Surgery

## 2023-06-28 NOTE — Telephone Encounter (Signed)
Responded to mychart message

## 2023-06-28 NOTE — Telephone Encounter (Signed)
This pt had an ankle scope and debridement yesterday. When can he return back to work/ regular activities? Just as tolerated?

## 2023-06-28 NOTE — Telephone Encounter (Signed)
Got pt scheduled and they had a few questions about PT and aftercare please advise when calling later

## 2023-06-28 NOTE — Telephone Encounter (Signed)
Ankle scopes can be sch at two weeks post op. Can you please call the pt to sch with Erin and I will call to address the return to work status after I speak with Dr. Lajoyce Corners. Thanks!

## 2023-06-28 NOTE — Telephone Encounter (Signed)
Pt had surgery on 06/27/23 needed 1 week post op visit no available appts and pt will need to know when he can return back to work or will need note stating he will be out until at least first appt, please advise call Spouse Shanda Bumps on file at [phone]

## 2023-06-28 NOTE — Telephone Encounter (Signed)
Patient's girlfriend called. She would like to know if patient can take a shower. [phone]

## 2023-06-28 NOTE — Telephone Encounter (Signed)
Patient's father Rosanne Ashing called asked when can his son return back to work? The number to contact Rosanne Ashing is [phone]

## 2023-07-03 ENCOUNTER — Ambulatory Visit (INDEPENDENT_AMBULATORY_CARE_PROVIDER_SITE_OTHER): Payer: Commercial Managed Care - PPO | Admitting: Orthopedic Surgery

## 2023-07-03 DIAGNOSIS — M659 Synovitis and tenosynovitis, unspecified: Secondary | ICD-10-CM

## 2023-07-03 DIAGNOSIS — M25571 Pain in right ankle and joints of right foot: Secondary | ICD-10-CM

## 2023-07-04 ENCOUNTER — Encounter: Payer: Self-pay | Admitting: Orthopedic Surgery

## 2023-07-04 NOTE — Progress Notes (Signed)
Office Visit Note   Patient: Ricardo Evans           Date of Birth: [birth year]           MRN: [id number] Visit Date: 07/03/2023              Requested by: [facility] [address] [place],  [place] [address] PCP: [facility]  Chief Complaint  Patient presents with   Right Ankle - Routine Post Op    06/27/23 right ankle scope and deb      HPI: Patient is a 42 year old gentleman status post right ankle arthroscopy and debridement for impingement with synovitis.  Patient states he has burning of pain across the the ball of his foot.  Patient complains of increased swelling.  Assessment & Plan: Visit Diagnoses:  1. Pain in right ankle and joints of right foot   2. Synovitis of right ankle     Plan: Will harvest the sutures set the patient up for physical therapy and [place].  Recommend range of motion of the toes to work on decrease swelling and elevation.  He is to start working on range of motion of the ankle.  Weightbearing as tolerated.  Follow-Up Instructions: Return in about 3 weeks (around 07/24/2023).   Ortho Exam  Patient is alert, oriented, no adenopathy, well-dressed, normal affect, normal respiratory effort. Examination there is no cellulitis there is swelling.  Patient has no tenderness and a negative Tinel's sign over the tarsal tunnel.  He has no decreased sensation over the dorsum of his foot he does have decreased sensation on the plantar aspect of the forefoot.  He has good range of motion of all toes.  He has dorsiflexion to neutral with swelling.  No deficit in the superficial peroneal nerve distribution or sural nerve distribution.  Imaging: No results found. No images are attached to the encounter.  Labs: Lab Results  Component Value Date   ESRSEDRATE 27 (H) 05/31/2020     Lab Results  Component Value Date   ALBUMIN 4.2 02/15/2023   ALBUMIN 4.1 02/11/2023   ALBUMIN 4.4 05/30/2020    Lab Results  Component Value Date   MG  1.7 05/30/2020   No results found for: "VD25OH"  No results found for: "PREALBUMIN"    Latest Ref Rng & Units 06/27/2023    8:32 AM 02/15/2023    6:01 PM 02/11/2023    7:24 AM  CBC EXTENDED  WBC 4.0 - 10.5 K/uL 6.9  5.8  11.6   RBC 4.22 - 5.81 MIL/uL 5.22  4.63  4.58   Hemoglobin 13.0 - 17.0 g/dL 19.1  47.8  29.5   HCT 39.0 - 52.0 % 48.1  43.2  41.4   Platelets 150 - 400 K/uL 148  186  155      There is no height or weight on file to calculate BMI.  Orders:  Orders Placed This Encounter  Procedures   Ambulatory referral to Physical Therapy   No orders of the defined types were placed in this encounter.    Procedures: No procedures performed  Clinical Data: No additional findings.  ROS:  All other systems negative, except as noted in the HPI. Review of Systems  Objective: Vital Signs: There were no vitals taken for this visit.  Specialty Comments:  No specialty comments available.  PMFS History: Patient Active Problem List   Diagnosis Date Noted   Impingement syndrome of right ankle 06/27/2023   Chronic systolic HF (  heart failure) (HCC) 07/26/2021   Essential hypertension 06/29/2020   Abnormal CT lung screening 06/29/2020   Acute combined systolic and diastolic HF (heart failure) (HCC) 06/18/2020   Aortic root dilatation (HCC) 06/18/2020   Cardiomyopathy (HCC) 06/18/2020   Acute respiratory failure with hypoxemia (HCC) 05/31/2020   Hilar adenopathy 05/31/2020   Dyspnea and respiratory abnormalities 05/31/2020   Dyspnea 05/30/2020   Hypertensive urgency 05/30/2020   SPRAIN AND STRAIN OF METACARPOPHALANGEAL OF HAND 01/17/2010   Past Medical History:  Diagnosis Date   Hypertension    Hypertensive heart disease    Systolic HF (heart failure) (HCC)     Family History  Problem Relation Age of Onset   Suicidality Mother    COPD Father    Heart disease Maternal Grandmother    Diverticulitis Maternal Uncle    Cancer - Colon Neg Hx    Colon polyps Neg Hx      Past Surgical History:  Procedure Laterality Date   ANKLE ARTHROSCOPY Right 06/27/2023   Procedure: RIGHT ANKLE ARTHROSCOPY AND DEBRIDEMENT;  Surgeon: Nadara Mustard, MD;  Location: [facility];  Service: Orthopedics;  Laterality: Right;   COLONOSCOPY WITH PROPOFOL N/A 04/05/2023   Procedure: COLONOSCOPY WITH PROPOFOL;  Surgeon: Corbin Ade, MD;  [facility];  Service: Endoscopy;  Laterality: N/A;  7:30 AM, ASA 3   POLYPECTOMY  04/05/2023   Procedure: POLYPECTOMY;  Surgeon: Corbin Ade, MD;  [facility]: [facility];  Service: Endoscopy;;   SPIGELIAN HERNIA     TONSILLECTOMY AND ADENOIDECTOMY     Social History   Occupational History   Not on file  Tobacco Use   Smoking status: Never   Smokeless tobacco: Never  Vaping Use   Vaping status: Never Used  Substance and Sexual Activity   Alcohol use: Not Currently   Drug use: Never   Sexual activity: Yes

## 2023-07-05 ENCOUNTER — Other Ambulatory Visit: Payer: Self-pay

## 2023-07-05 ENCOUNTER — Ambulatory Visit: Payer: Commercial Managed Care - PPO | Attending: Orthopedic Surgery

## 2023-07-05 DIAGNOSIS — M659 Synovitis and tenosynovitis, unspecified: Secondary | ICD-10-CM | POA: Diagnosis not present

## 2023-07-05 DIAGNOSIS — R600 Localized edema: Secondary | ICD-10-CM | POA: Insufficient documentation

## 2023-07-05 DIAGNOSIS — M25571 Pain in right ankle and joints of right foot: Secondary | ICD-10-CM | POA: Insufficient documentation

## 2023-07-05 DIAGNOSIS — M25671 Stiffness of right ankle, not elsewhere classified: Secondary | ICD-10-CM | POA: Insufficient documentation

## 2023-07-05 NOTE — Therapy (Signed)
OUTPATIENT PHYSICAL THERAPY LOWER EXTREMITY EVALUATION   Patient Name: Ricardo Evans MRN: [id number] DOB:[birth year], 42 y.o., male Today's Date: 07/05/2023  END OF SESSION:  PT End of Session - 07/05/23 1352     Visit Number 1    Number of Visits 12    Date for PT Re-Evaluation 09/21/23    PT Start Time 1353    PT Stop Time 1428    PT Time Calculation (min) 35 min    Activity Tolerance Patient tolerated treatment well    Behavior During Therapy [facility] for tasks assessed/performed             Past Medical History:  Diagnosis Date   Hypertension    Hypertensive heart disease    Systolic HF (heart failure) (HCC)    Past Surgical History:  Procedure Laterality Date   ANKLE ARTHROSCOPY Right 06/27/2023   Procedure: RIGHT ANKLE ARTHROSCOPY AND DEBRIDEMENT;  Surgeon: Nadara Mustard, MD;  Location: [facility] OR;  Service: Orthopedics;  Laterality: Right;   COLONOSCOPY WITH PROPOFOL N/A 04/05/2023   Procedure: COLONOSCOPY WITH PROPOFOL;  Surgeon: Corbin Ade, MD;  [facility]: [facility];  Service: Endoscopy;  Laterality: N/A;  7:30 AM, ASA 3   POLYPECTOMY  04/05/2023   Procedure: POLYPECTOMY;  Surgeon: Corbin Ade, MD;  [facility];  Service: Endoscopy;;   SPIGELIAN HERNIA     TONSILLECTOMY AND ADENOIDECTOMY     Patient Active Problem List   Diagnosis Date Noted   Impingement syndrome of right ankle 06/27/2023   Chronic systolic HF (heart failure) (HCC) 07/26/2021   Essential hypertension 06/29/2020   Abnormal CT lung screening 06/29/2020   Acute combined systolic and diastolic HF (heart failure) (HCC) 06/18/2020   Aortic root dilatation (HCC) 06/18/2020   Cardiomyopathy (HCC) 06/18/2020   Acute respiratory failure with hypoxemia (HCC) 05/31/2020   Hilar adenopathy 05/31/2020   Dyspnea and respiratory abnormalities 05/31/2020   Dyspnea 05/30/2020   Hypertensive urgency 05/30/2020   SPRAIN AND STRAIN OF METACARPOPHALANGEAL OF HAND 01/17/2010   REFERRING  PROVIDER: Nadara Mustard, MD   REFERRING DIAG: Pain in right ankle and joints of right foot; Synovitis of right ankle   THERAPY DIAG:  Stiffness of right ankle, not elsewhere classified  Pain in right ankle and joints of right foot  Localized edema  Rationale for Evaluation and Treatment: Rehabilitation  ONSET DATE: 06/27/23  SUBJECTIVE:   SUBJECTIVE STATEMENT: Patient reports that fractured and tore some ligaments in his right ankle and had surgery on 06/27/23. He notes that the only thing that really bothers him is putting pressure on his foot and moving his toes. He notes that elevation is really helping. Today is the first day he has been walking without crutches. He was told to do things as tolerated.   PERTINENT HISTORY: Hypertension and heart failure PAIN:  Are you having pain? Yes: NPRS scale: 5/10 Pain location: right plantar surface Pain description: sharp and pressure Aggravating factors: walking Relieving factors: elevating his foot  PRECAUTIONS: None  RED FLAGS: None   WEIGHT BEARING RESTRICTIONS: No  FALLS:  Has patient fallen in last 6 months? No  LIVING ENVIRONMENT: Lives with: lives alone Lives in: House/apartment Stairs: Yes: External: 3 steps; can reach both; reciprocal pattern ascending and step to pattern descending Has following equipment at home: None  OCCUPATION: planning to return to work on 07/09/23; job requires primarily walking on concrete floor, but is able to stop and sit if needed  PLOF: Independent  PATIENT GOALS: play golf and baseball (umpire), and be able to walk normally  NEXT MD VISIT: 07/23/23  OBJECTIVE:   PATIENT SURVEYS:  FOTO 39.39  COGNITION: Overall cognitive status: Within functional limits for tasks assessed     SENSATION: Patient reports no numbness or tingling  EDEMA:  Circumferential: L: 55.7 cm R: 60 cm   PALPATION: TTP: right plantar fascia   JOINT MOBILITY:  Right talocrural: hypomobile and  painful  Right subtalar: hypomobile and nonpainful   Right midfoot: hypomobile and painful   LOWER EXTREMITY ROM:  Active ROM Right eval Left eval  Hip flexion    Hip extension    Hip abduction    Hip adduction    Hip internal rotation    Hip external rotation    Knee flexion    Knee extension    Ankle dorsiflexion -7 5  Ankle plantarflexion 35 38  Ankle inversion 18 38  Ankle eversion 20 30   (Blank rows = not tested)  LOWER EXTREMITY MMT: not tested due to surgical condition  LOWER EXTREMITY SPECIAL TESTS:  Not tested due to surgical condition  GAIT: Assistive device utilized: None Level of assistance: Complete Independence Comments: Decreased stride length with decreased stance time on the right lower extremity, poor right foot toe off, in right foot flat at initial contact   TODAY'S TREATMENT:                                                                                                                              DATE:     PATIENT EDUCATION:  Education details: Plan of care, healing, prognosis, edema, benefits of ice and elevation, and goals for therapy Person educated: Patient Education method: Explanation Education comprehension: verbalized understanding  HOME EXERCISE PROGRAM:   ASSESSMENT:  CLINICAL IMPRESSION: Patient is a 42 y.o. male who was seen today for physical therapy evaluation and treatment for right ankle pain and stiffness secondary to a arthroscopy and debridement on 06/27/23. He presented with moderate pain severity and irritability with palpation and right ankle joint mobility assessment reproducing his familiar pain. He also exhibited reduced right ankle active range of motion compared to the left ankle. Recommend that he continue with skilled physical therapy to address his impairments to return to his prior level of function.  OBJECTIVE IMPAIRMENTS: Abnormal gait, decreased activity tolerance, decreased balance, decreased mobility,  difficulty walking, decreased ROM, decreased strength, hypomobility, increased edema, and pain.   ACTIVITY LIMITATIONS: carrying, lifting, standing, squatting, stairs, and locomotion level  PARTICIPATION LIMITATIONS: meal prep, cleaning, laundry, driving, shopping, community activity, occupation, and yard work  PERSONAL FACTORS: Transportation and 1-2 comorbidities: Hypertension and heart failure  are also affecting patient's functional outcome.   REHAB POTENTIAL: Good  CLINICAL DECISION MAKING: Stable/uncomplicated  EVALUATION COMPLEXITY: Low   GOALS: Goals reviewed with patient? Yes  SHORT TERM GOALS: Target date: 07/26/23 Patient will be independent with his initial HEP. Baseline: Goal status: INITIAL  2.  Patient will be able to complete his daily activities without his familiar pain exceeding 3/10. Baseline:  Goal status: INITIAL  3.  Patient will be able to demonstrate right ankle dorsiflexion to at least neutral for improved gait mechanics. Baseline:  Goal status: INITIAL  LONG TERM GOALS: Target date: 08/16/23  Patient will be independent with his advanced HEP. Baseline:  Goal status: INITIAL  2.  Patient will be able to ambulate with no significant gait deviations. Baseline:  Goal status: INITIAL  3.  Patient will be able to demonstrate at least 5 degrees of right ankle dorsiflexion for improved function squatting. Baseline:  Goal status: INITIAL  4.  Patient will be able to descend stairs with a reciprocal pattern for improved household mobility. Baseline:  Goal status: INITIAL  PLAN:  PT FREQUENCY: 2x/week  PT DURATION: 4 weeks  PLANNED INTERVENTIONS: Therapeutic exercises, Therapeutic activity, Neuromuscular re-education, Balance training, Gait training, Patient/Family education, Self Care, Joint mobilization, Stair training, Electrical stimulation, Cryotherapy, Moist heat, scar mobilization, Taping, Vasopneumatic device, Manual therapy, and  Re-evaluation  PLAN FOR NEXT SESSION: NuStep, manual therapy, provide HEP as able, ankle active and passive range of motion, and modalities as needed   Granville Lewis, PT 07/05/2023, 6:55 PM

## 2023-07-10 ENCOUNTER — Ambulatory Visit: Payer: Commercial Managed Care - PPO

## 2023-07-10 DIAGNOSIS — M25671 Stiffness of right ankle, not elsewhere classified: Secondary | ICD-10-CM

## 2023-07-10 DIAGNOSIS — M25571 Pain in right ankle and joints of right foot: Secondary | ICD-10-CM

## 2023-07-10 DIAGNOSIS — R600 Localized edema: Secondary | ICD-10-CM

## 2023-07-10 NOTE — Therapy (Signed)
OUTPATIENT PHYSICAL THERAPY LOWER EXTREMITY TREATMENT   Patient Name: Ricardo Evans MRN: [id number] DOB:May 23, 1981, 42 y.o., male Today's Date: 07/10/2023  END OF SESSION:  PT End of Session - 07/10/23 1605     Visit Number 2    Number of Visits 12    Date for PT Re-Evaluation 09/21/23    PT Start Time 1602    PT Stop Time 1651    PT Time Calculation (min) 49 min    Activity Tolerance Patient tolerated treatment well    Behavior During Therapy [facility] for tasks assessed/performed              Past Medical History:  Diagnosis Date   Hypertension    Hypertensive heart disease    Systolic HF (heart failure) (HCC)    Past Surgical History:  Procedure Laterality Date   ANKLE ARTHROSCOPY Right 06/27/2023   Procedure: RIGHT ANKLE ARTHROSCOPY AND DEBRIDEMENT;  Surgeon: Nadara Mustard, MD;  Location: [facility] OR;  Service: Orthopedics;  Laterality: Right;   COLONOSCOPY WITH PROPOFOL N/A 04/05/2023   Procedure: COLONOSCOPY WITH PROPOFOL;  Surgeon: Corbin Ade, MD;  Location: [facility];  Service: Endoscopy;  Laterality: N/A;  7:30 AM, ASA 3   POLYPECTOMY  04/05/2023   Procedure: POLYPECTOMY;  Surgeon: Corbin Ade, MD;  Location: [facility];  Service: Endoscopy;;   SPIGELIAN HERNIA     TONSILLECTOMY AND ADENOIDECTOMY     Patient Active Problem List   Diagnosis Date Noted   Impingement syndrome of right ankle 06/27/2023   Chronic systolic HF (heart failure) (HCC) 07/26/2021   Essential hypertension 06/29/2020   Abnormal CT lung screening 06/29/2020   Acute combined systolic and diastolic HF (heart failure) (HCC) 06/18/2020   Aortic root dilatation (HCC) 06/18/2020   Cardiomyopathy (HCC) 06/18/2020   Acute respiratory failure with hypoxemia (HCC) 05/31/2020   Hilar adenopathy 05/31/2020   Dyspnea and respiratory abnormalities 05/31/2020   Dyspnea 05/30/2020   Hypertensive urgency 05/30/2020   SPRAIN AND STRAIN OF METACARPOPHALANGEAL OF HAND 01/17/2010   REFERRING  PROVIDER: Nadara Mustard, MD   REFERRING DIAG: Pain in right ankle and joints of right foot; Synovitis of right ankle   THERAPY DIAG:  Stiffness of right ankle, not elsewhere classified  Pain in right ankle and joints of right foot  Localized edema  Rationale for Evaluation and Treatment: Rehabilitation  ONSET DATE: 06/27/23  SUBJECTIVE:   SUBJECTIVE STATEMENT: Patient notes that his ankle is really swollen today, but he isn't sure why.   PERTINENT HISTORY: Hypertension and heart failure PAIN:  Are you having pain? Yes: NPRS scale: 5/10 Pain location: right plantar surface Pain description: sharp and pressure Aggravating factors: walking Relieving factors: elevating his foot  PRECAUTIONS: None  RED FLAGS: None   WEIGHT BEARING RESTRICTIONS: No  FALLS:  Has patient fallen in last 6 months? No  LIVING ENVIRONMENT: Lives with: lives alone Lives in: House/apartment Stairs: Yes: External: 3 steps; can reach both; reciprocal pattern ascending and step to pattern descending Has following equipment at home: None  OCCUPATION: planning to return to work on 07/09/23; job requires primarily walking on concrete floor, but is able to stop and sit if needed  PLOF: Independent  PATIENT GOALS: play golf and baseball (umpire), and be able to walk normally  NEXT MD VISIT: 07/23/23  OBJECTIVE: all objective measures were assessed at her initial evaluation on 07/05/23 unless otherwise noted  PATIENT SURVEYS:  FOTO 39.39  COGNITION: Overall cognitive status: Within functional limits  for tasks assessed     SENSATION: Patient reports no numbness or tingling  EDEMA:  Circumferential: L: 55.7 cm R: 60 cm   PALPATION: TTP: right plantar fascia   JOINT MOBILITY:  Right talocrural: hypomobile and painful  Right subtalar: hypomobile and nonpainful   Right midfoot: hypomobile and painful   LOWER EXTREMITY ROM:  Active ROM Right eval Left eval  Hip flexion    Hip extension     Hip abduction    Hip adduction    Hip internal rotation    Hip external rotation    Knee flexion    Knee extension    Ankle dorsiflexion -7 5  Ankle plantarflexion 35 38  Ankle inversion 18 38  Ankle eversion 20 30   (Blank rows = not tested)  LOWER EXTREMITY MMT: not tested due to surgical condition  LOWER EXTREMITY SPECIAL TESTS:  Not tested due to surgical condition  GAIT: Assistive device utilized: None Level of assistance: Complete Independence Comments: Decreased stride length with decreased stance time on the right lower extremity, poor right foot toe off, in right foot flat at initial contact   TODAY'S TREATMENT:                                                                                                                              DATE:                                     07/10/23 EXERCISE LOG  Exercise Repetitions and Resistance Comments  Nustep  L3 x 15 minutes; seat 13    Rocker board  4 minutes    Circles  20 reps each  CW and CCW  BAPS  L2 x 2 minutes each  DF/PF and inversion/eversion       Blank cell = exercise not performed today  Modalities: no redness or adverse reaction to today's modalities  Date:  Vaso: Ankle, 34 degrees; low pressure, 15 mins, Pain and Edema  PATIENT EDUCATION:  Education details: Plan of care, healing, prognosis, edema, benefits of ice and elevation, and goals for therapy Person educated: Patient Education method: Explanation Education comprehension: verbalized understanding  HOME EXERCISE PROGRAM:   ASSESSMENT:  CLINICAL IMPRESSION: Patient was introduced to multiple new interventions for improved ankle mobility with moderate difficulty. He required moderate verbal and tactile cueing with BAPS inversion and eversion to prevent hip internal and external rotation to isolate right ankle range of motion. He experienced no significant increase in pain or discomfort with any of today's interventions. He reported that his  ankle felt better upon the conclusion of treatment. He continues to require skilled physical therapy to address his remaining impairments to return to his prior level of function.   OBJECTIVE IMPAIRMENTS: Abnormal gait, decreased activity tolerance, decreased balance, decreased mobility, difficulty walking, decreased ROM, decreased strength, hypomobility, increased edema, and pain.  ACTIVITY LIMITATIONS: carrying, lifting, standing, squatting, stairs, and locomotion level  PARTICIPATION LIMITATIONS: meal prep, cleaning, laundry, driving, shopping, community activity, occupation, and yard work  PERSONAL FACTORS: Transportation and 1-2 comorbidities: Hypertension and heart failure  are also affecting patient's functional outcome.   REHAB POTENTIAL: Good  CLINICAL DECISION MAKING: Stable/uncomplicated  EVALUATION COMPLEXITY: Low   GOALS: Goals reviewed with patient? Yes  SHORT TERM GOALS: Target date: 07/26/23 Patient will be independent with his initial HEP. Baseline: Goal status: INITIAL  2.  Patient will be able to complete his daily activities without his familiar pain exceeding 3/10. Baseline:  Goal status: INITIAL  3.  Patient will be able to demonstrate right ankle dorsiflexion to at least neutral for improved gait mechanics. Baseline:  Goal status: INITIAL  LONG TERM GOALS: Target date: 08/16/23  Patient will be independent with his advanced HEP. Baseline:  Goal status: INITIAL  2.  Patient will be able to ambulate with no significant gait deviations. Baseline:  Goal status: INITIAL  3.  Patient will be able to demonstrate at least 5 degrees of right ankle dorsiflexion for improved function squatting. Baseline:  Goal status: INITIAL  4.  Patient will be able to descend stairs with a reciprocal pattern for improved household mobility. Baseline:  Goal status: INITIAL  PLAN:  PT FREQUENCY: 2x/week  PT DURATION: 4 weeks  PLANNED INTERVENTIONS: Therapeutic  exercises, Therapeutic activity, Neuromuscular re-education, Balance training, Gait training, Patient/Family education, Self Care, Joint mobilization, Stair training, Electrical stimulation, Cryotherapy, Moist heat, scar mobilization, Taping, Vasopneumatic device, Manual therapy, and Re-evaluation  PLAN FOR NEXT SESSION: NuStep, manual therapy, provide HEP as able, ankle active and passive range of motion, and modalities as needed   Granville Lewis, PT 07/10/2023, 5:13 PM

## 2023-07-11 ENCOUNTER — Encounter: Payer: Commercial Managed Care - PPO | Admitting: Family

## 2023-07-12 ENCOUNTER — Ambulatory Visit: Payer: Commercial Managed Care - PPO

## 2023-07-12 DIAGNOSIS — R600 Localized edema: Secondary | ICD-10-CM

## 2023-07-12 DIAGNOSIS — M25671 Stiffness of right ankle, not elsewhere classified: Secondary | ICD-10-CM

## 2023-07-12 DIAGNOSIS — M25571 Pain in right ankle and joints of right foot: Secondary | ICD-10-CM

## 2023-07-12 NOTE — Therapy (Signed)
OUTPATIENT PHYSICAL THERAPY LOWER EXTREMITY TREATMENT   Patient Name: Ricardo Evans MRN: [id number] DOB:1981-03-29, 42 y.o., male Today's Date: 07/12/2023  END OF SESSION:  PT End of Session - 07/12/23 1612     Visit Number 3    Number of Visits 12    Date for PT Re-Evaluation 09/21/23    PT Start Time 1600    PT Stop Time 1638   Patient requested to leave early for another appointment   PT Time Calculation (min) 38 min    Activity Tolerance Patient tolerated treatment well    Behavior During Therapy [facility] for tasks assessed/performed               Past Medical History:  Diagnosis Date   Hypertension    Hypertensive heart disease    Systolic HF (heart failure) (HCC)    Past Surgical History:  Procedure Laterality Date   ANKLE ARTHROSCOPY Right 06/27/2023   Procedure: RIGHT ANKLE ARTHROSCOPY AND DEBRIDEMENT;  Surgeon: Nadara Mustard, MD;  Location: [facility] OR;  Service: Orthopedics;  Laterality: Right;   COLONOSCOPY WITH PROPOFOL N/A 04/05/2023   Procedure: COLONOSCOPY WITH PROPOFOL;  Surgeon: Corbin Ade, MD;  [facility];  Service: Endoscopy;  Laterality: N/A;  7:30 AM, ASA 3   POLYPECTOMY  04/05/2023   Procedure: POLYPECTOMY;  Surgeon: Corbin Ade, MD;  Location: [facility];  Service: Endoscopy;;   SPIGELIAN HERNIA     TONSILLECTOMY AND ADENOIDECTOMY     Patient Active Problem List   Diagnosis Date Noted   Impingement syndrome of right ankle 06/27/2023   Chronic systolic HF (heart failure) (HCC) 07/26/2021   Essential hypertension 06/29/2020   Abnormal CT lung screening 06/29/2020   Acute combined systolic and diastolic HF (heart failure) (HCC) 06/18/2020   Aortic root dilatation (HCC) 06/18/2020   Cardiomyopathy (HCC) 06/18/2020   Acute respiratory failure with hypoxemia (HCC) 05/31/2020   Hilar adenopathy 05/31/2020   Dyspnea and respiratory abnormalities 05/31/2020   Dyspnea 05/30/2020   Hypertensive urgency 05/30/2020   SPRAIN AND STRAIN OF  METACARPOPHALANGEAL OF HAND 01/17/2010   REFERRING PROVIDER: Nadara Mustard, MD   REFERRING DIAG: Pain in right ankle and joints of right foot; Synovitis of right ankle   THERAPY DIAG:  Stiffness of right ankle, not elsewhere classified  Pain in right ankle and joints of right foot  Localized edema  Rationale for Evaluation and Treatment: Rehabilitation  ONSET DATE: 06/27/23  SUBJECTIVE:   SUBJECTIVE STATEMENT: Patient reports that his swelling better today and he is able to walk better as well. He notes that he has been elevating his foot at night.    PERTINENT HISTORY: Hypertension and heart failure PAIN:  Are you having pain? Yes: NPRS scale: 3/10 Pain location: right plantar surface Pain description: sharp and pressure Aggravating factors: walking Relieving factors: elevating his foot  PRECAUTIONS: None  RED FLAGS: None   WEIGHT BEARING RESTRICTIONS: No  FALLS:  Has patient fallen in last 6 months? No  LIVING ENVIRONMENT: Lives with: lives alone Lives in: House/apartment Stairs: Yes: External: 3 steps; can reach both; reciprocal pattern ascending and step to pattern descending Has following equipment at home: None  OCCUPATION: planning to return to work on 07/09/23; job requires primarily walking on concrete floor, but is able to stop and sit if needed  PLOF: Independent  PATIENT GOALS: play golf and baseball (umpire), and be able to walk normally  NEXT MD VISIT: 07/23/23  OBJECTIVE: all objective measures were  assessed at her initial evaluation on 07/05/23 unless otherwise noted  PATIENT SURVEYS:  FOTO 39.39  COGNITION: Overall cognitive status: Within functional limits for tasks assessed     SENSATION: Patient reports no numbness or tingling  EDEMA:  Circumferential: L: 55.7 cm R: 60 cm   PALPATION: TTP: right plantar fascia   JOINT MOBILITY:  Right talocrural: hypomobile and painful  Right subtalar: hypomobile and nonpainful   Right  midfoot: hypomobile and painful   LOWER EXTREMITY ROM:  Active ROM Right eval Left eval  Hip flexion    Hip extension    Hip abduction    Hip adduction    Hip internal rotation    Hip external rotation    Knee flexion    Knee extension    Ankle dorsiflexion -7 5  Ankle plantarflexion 35 38  Ankle inversion 18 38  Ankle eversion 20 30   (Blank rows = not tested)  LOWER EXTREMITY MMT: not tested due to surgical condition  LOWER EXTREMITY SPECIAL TESTS:  Not tested due to surgical condition  GAIT: Assistive device utilized: None Level of assistance: Complete Independence Comments: Decreased stride length with decreased stance time on the right lower extremity, poor right foot toe off, in right foot flat at initial contact   TODAY'S TREATMENT:                                                                                                                              DATE:                                     07/12/23 EXERCISE LOG  Exercise Repetitions and Resistance Comments  Nustep  L3 x 15 minutes; seat 11   Rocker board  2.5 minutes each  DF/PF and inversion/eversion  Tandem stance on foam  3 x 30 seconds each  Intermittent UE support   Lunge onto step  6" step x 10 reps Limited by sharp pain   Ankle circles  20 reps each  CW and CCW  R ankle PF  Green t-band x 20 reps     Blank cell = exercise not performed today                                    07/10/23 EXERCISE LOG  Exercise Repetitions and Resistance Comments  Nustep  L3 x 15 minutes; seat 13    Rocker board  4 minutes    Circles  20 reps each  CW and CCW  BAPS  L2 x 2 minutes each  DF/PF and inversion/eversion       Blank cell = exercise not performed today  Modalities: no redness or adverse reaction to today's modalities  Date:  Vaso: Ankle, 34 degrees; low pressure, 15 mins, Pain and Edema  PATIENT EDUCATION:  Education details: Plan of care, healing, prognosis, edema, benefits of ice and elevation,  and goals for therapy Person educated: Patient Education method: Explanation Education comprehension: verbalized understanding  HOME EXERCISE PROGRAM:   ASSESSMENT:  CLINICAL IMPRESSION: Patient was introduced to multiple new interventions for improved ankle mobility. He required minimal cueing with resisted ankle plantar flexion for improved eccentric control to maximize gastrocnemius engagement. He experienced a brief increase in pain with lunges onto a step which resulted in this intervention being held at this time. However, this did not inhibit his ability to complete any of today's other interventions. He reported that his ankle felt a little tired upon the conclusion of treatment. He continues to require skilled physical therapy to address his remaining impairments to return to his prior level of function.   OBJECTIVE IMPAIRMENTS: Abnormal gait, decreased activity tolerance, decreased balance, decreased mobility, difficulty walking, decreased ROM, decreased strength, hypomobility, increased edema, and pain.   ACTIVITY LIMITATIONS: carrying, lifting, standing, squatting, stairs, and locomotion level  PARTICIPATION LIMITATIONS: meal prep, cleaning, laundry, driving, shopping, community activity, occupation, and yard work  PERSONAL FACTORS: Transportation and 1-2 comorbidities: Hypertension and heart failure  are also affecting patient's functional outcome.   REHAB POTENTIAL: Good  CLINICAL DECISION MAKING: Stable/uncomplicated  EVALUATION COMPLEXITY: Low   GOALS: Goals reviewed with patient? Yes  SHORT TERM GOALS: Target date: 07/26/23 Patient will be independent with his initial HEP. Baseline: Goal status: INITIAL  2.  Patient will be able to complete his daily activities without his familiar pain exceeding 3/10. Baseline:  Goal status: INITIAL  3.  Patient will be able to demonstrate right ankle dorsiflexion to at least neutral for improved gait mechanics. Baseline:   Goal status: INITIAL  LONG TERM GOALS: Target date: 08/16/23  Patient will be independent with his advanced HEP. Baseline:  Goal status: INITIAL  2.  Patient will be able to ambulate with no significant gait deviations. Baseline:  Goal status: INITIAL  3.  Patient will be able to demonstrate at least 5 degrees of right ankle dorsiflexion for improved function squatting. Baseline:  Goal status: INITIAL  4.  Patient will be able to descend stairs with a reciprocal pattern for improved household mobility. Baseline:  Goal status: INITIAL  PLAN:  PT FREQUENCY: 2x/week  PT DURATION: 4 weeks  PLANNED INTERVENTIONS: Therapeutic exercises, Therapeutic activity, Neuromuscular re-education, Balance training, Gait training, Patient/Family education, Self Care, Joint mobilization, Stair training, Electrical stimulation, Cryotherapy, Moist heat, scar mobilization, Taping, Vasopneumatic device, Manual therapy, and Re-evaluation  PLAN FOR NEXT SESSION: NuStep, manual therapy, provide HEP as able, ankle active and passive range of motion, and modalities as needed   Granville Lewis, PT 07/12/2023, 6:08 PM

## 2023-07-17 ENCOUNTER — Ambulatory Visit: Payer: Commercial Managed Care - PPO

## 2023-07-17 DIAGNOSIS — M25571 Pain in right ankle and joints of right foot: Secondary | ICD-10-CM

## 2023-07-17 DIAGNOSIS — M25671 Stiffness of right ankle, not elsewhere classified: Secondary | ICD-10-CM | POA: Diagnosis not present

## 2023-07-17 DIAGNOSIS — R600 Localized edema: Secondary | ICD-10-CM

## 2023-07-17 NOTE — Therapy (Signed)
OUTPATIENT PHYSICAL THERAPY LOWER EXTREMITY TREATMENT   Patient Name: Ricardo Evans MRN: [id number] DOB:[birth year], 42 y.o., male Today's Date: 07/17/2023  END OF SESSION:  PT End of Session - 07/17/23 1518     Visit Number 4    Number of Visits 12    Date for PT Re-Evaluation 09/21/23    PT Start Time 1515    PT Stop Time 1600    PT Time Calculation (min) 45 min    Activity Tolerance Patient tolerated treatment well    Behavior During Therapy [facility] for tasks assessed/performed               Past Medical History:  Diagnosis Date   Hypertension    Hypertensive heart disease    Systolic HF (heart failure) (HCC)    Past Surgical History:  Procedure Laterality Date   ANKLE ARTHROSCOPY Right 06/27/2023   Procedure: RIGHT ANKLE ARTHROSCOPY AND DEBRIDEMENT;  Surgeon: Nadara Mustard, MD;  Location: [facility] OR;  Service: Orthopedics;  Laterality: Right;   COLONOSCOPY WITH PROPOFOL N/A 04/05/2023   Procedure: COLONOSCOPY WITH PROPOFOL;  Surgeon: Corbin Ade, MD;  [facility];  Service: Endoscopy;  Laterality: N/A;  7:30 AM, ASA 3   POLYPECTOMY  04/05/2023   Procedure: POLYPECTOMY;  Surgeon: Corbin Ade, MD;  [facility]: [facility];  Service: Endoscopy;;   SPIGELIAN HERNIA     TONSILLECTOMY AND ADENOIDECTOMY     Patient Active Problem List   Diagnosis Date Noted   Impingement syndrome of right ankle 06/27/2023   Chronic systolic HF (heart failure) (HCC) 07/26/2021   Essential hypertension 06/29/2020   Abnormal CT lung screening 06/29/2020   Acute combined systolic and diastolic HF (heart failure) (HCC) 06/18/2020   Aortic root dilatation (HCC) 06/18/2020   Cardiomyopathy (HCC) 06/18/2020   Acute respiratory failure with hypoxemia (HCC) 05/31/2020   Hilar adenopathy 05/31/2020   Dyspnea and respiratory abnormalities 05/31/2020   Dyspnea 05/30/2020   Hypertensive urgency 05/30/2020   SPRAIN AND STRAIN OF METACARPOPHALANGEAL OF HAND 01/17/2010   REFERRING  PROVIDER: Nadara Mustard, MD   REFERRING DIAG: Pain in right ankle and joints of right foot; Synovitis of right ankle   THERAPY DIAG:  Pain in right ankle and joints of right foot  Stiffness of right ankle, not elsewhere classified  Localized edema  Rationale for Evaluation and Treatment: Rehabilitation  ONSET DATE: 06/27/23  SUBJECTIVE:   SUBJECTIVE STATEMENT: Patient reports 3/10 right ankle pain on medial side.  Pt reports increased swelling on lateral side.   PERTINENT HISTORY: Hypertension and heart failure PAIN:  Are you having pain? Yes: NPRS scale: 3/10 Pain location: right medial ankle pain Pain description: sharp and pressure Aggravating factors: walking Relieving factors: elevating his foot  PRECAUTIONS: None  RED FLAGS: None   WEIGHT BEARING RESTRICTIONS: No  FALLS:  Has patient fallen in last 6 months? No  LIVING ENVIRONMENT: Lives with: lives alone Lives in: House/apartment Stairs: Yes: External: 3 steps; can reach both; reciprocal pattern ascending and step to pattern descending Has following equipment at home: None  OCCUPATION: planning to return to work on 07/09/23; job requires primarily walking on concrete floor, but is able to stop and sit if needed  PLOF: Independent  PATIENT GOALS: play golf and baseball (umpire), and be able to walk normally  NEXT MD VISIT: 07/23/23  OBJECTIVE: all objective measures were assessed at her initial evaluation on 07/05/23 unless otherwise noted  PATIENT SURVEYS:  FOTO 39.39  COGNITION: Overall  cognitive status: Within functional limits for tasks assessed     SENSATION: Patient reports no numbness or tingling  EDEMA:  Circumferential: L: 55.7 cm R: 60 cm   PALPATION: TTP: right plantar fascia   JOINT MOBILITY:  Right talocrural: hypomobile and painful  Right subtalar: hypomobile and nonpainful   Right midfoot: hypomobile and painful   LOWER EXTREMITY ROM:  Active ROM Right eval Left eval   Hip flexion    Hip extension    Hip abduction    Hip adduction    Hip internal rotation    Hip external rotation    Knee flexion    Knee extension    Ankle dorsiflexion -7 5  Ankle plantarflexion 35 38  Ankle inversion 18 38  Ankle eversion 20 30   (Blank rows = not tested)  LOWER EXTREMITY MMT: not tested due to surgical condition  LOWER EXTREMITY SPECIAL TESTS:  Not tested due to surgical condition  GAIT: Assistive device utilized: None Level of assistance: Complete Independence Comments: Decreased stride length with decreased stance time on the right lower extremity, poor right foot toe off, in right foot flat at initial contact   TODAY'S TREATMENT:                                                                                                                              DATE:                                     07/17/23 EXERCISE LOG  Exercise Repetitions and Resistance Comments  Nustep  L4 x 15 minutes; seat 11   Rocker board  3 minutes each  DF/PF and inversion/eversion  Marching on BOSU  2 mins (ball up) Intermittent UE support   Lunge onto step  6" step x 10 reps Limited by sharp pain   Ankle circles  Dynadisc CW and CCW x 2 mins CW and CCW  ABCs 2 reps   R ankle PF  Green t-band x 20 reps     Blank cell = exercise not performed today                                    07/10/23 EXERCISE LOG  Exercise Repetitions and Resistance Comments  Nustep  L3 x 15 minutes; seat 13    Rocker board  4 minutes    Circles  20 reps each  CW and CCW  BAPS  L2 x 2 minutes each  DF/PF and inversion/eversion       Blank cell = exercise not performed today  Modalities: no redness or adverse reaction to today's modalities  Date:  Vaso: Ankle, 34 degrees; low pressure, 15 mins, Pain and Edema  PATIENT EDUCATION:  Education details: Plan of care, healing, prognosis, edema, benefits of ice  and elevation, and goals for therapy Person educated: Patient Education method:  Explanation Education comprehension: verbalized understanding  HOME EXERCISE PROGRAM:   ASSESSMENT:  CLINICAL IMPRESSION: Pt arrives for today's treatment session reporting 3/10 right ankle pain with slight increase in swelling.  Pt reports that over the weekend his lateral ankle swelling was more than normal and was concerning.  Pt instructed in purchasing compression socks for decreased swelling.  Pt able to tolerate increased time or reps with various exercises today with minimal discomfort.  Pt denied any change in pain at completion of today's treatment session.  OBJECTIVE IMPAIRMENTS: Abnormal gait, decreased activity tolerance, decreased balance, decreased mobility, difficulty walking, decreased ROM, decreased strength, hypomobility, increased edema, and pain.   ACTIVITY LIMITATIONS: carrying, lifting, standing, squatting, stairs, and locomotion level  PARTICIPATION LIMITATIONS: meal prep, cleaning, laundry, driving, shopping, community activity, occupation, and yard work  PERSONAL FACTORS: Transportation and 1-2 comorbidities: Hypertension and heart failure  are also affecting patient's functional outcome.   REHAB POTENTIAL: Good  CLINICAL DECISION MAKING: Stable/uncomplicated  EVALUATION COMPLEXITY: Low   GOALS: Goals reviewed with patient? Yes  SHORT TERM GOALS: Target date: 07/26/23 Patient will be independent with his initial HEP. Baseline: Goal status: INITIAL  2.  Patient will be able to complete his daily activities without his familiar pain exceeding 3/10. Baseline:  Goal status: INITIAL  3.  Patient will be able to demonstrate right ankle dorsiflexion to at least neutral for improved gait mechanics. Baseline:  Goal status: INITIAL  LONG TERM GOALS: Target date: 08/16/23  Patient will be independent with his advanced HEP. Baseline:  Goal status: INITIAL  2.  Patient will be able to ambulate with no significant gait deviations. Baseline:  Goal status:  INITIAL  3.  Patient will be able to demonstrate at least 5 degrees of right ankle dorsiflexion for improved function squatting. Baseline:  Goal status: INITIAL  4.  Patient will be able to descend stairs with a reciprocal pattern for improved household mobility. Baseline:  Goal status: INITIAL  PLAN:  PT FREQUENCY: 2x/week  PT DURATION: 4 weeks  PLANNED INTERVENTIONS: Therapeutic exercises, Therapeutic activity, Neuromuscular re-education, Balance training, Gait training, Patient/Family education, Self Care, Joint mobilization, Stair training, Electrical stimulation, Cryotherapy, Moist heat, scar mobilization, Taping, Vasopneumatic device, Manual therapy, and Re-evaluation  PLAN FOR NEXT SESSION: NuStep, manual therapy, provide HEP as able, ankle active and passive range of motion, and modalities as needed   Newman Pies, PTA 07/17/2023, 4:20 PM

## 2023-07-19 ENCOUNTER — Ambulatory Visit: Payer: Commercial Managed Care - PPO

## 2023-07-19 DIAGNOSIS — M25671 Stiffness of right ankle, not elsewhere classified: Secondary | ICD-10-CM

## 2023-07-19 DIAGNOSIS — R600 Localized edema: Secondary | ICD-10-CM

## 2023-07-19 DIAGNOSIS — M25571 Pain in right ankle and joints of right foot: Secondary | ICD-10-CM

## 2023-07-19 NOTE — Therapy (Signed)
OUTPATIENT PHYSICAL THERAPY LOWER EXTREMITY TREATMENT   Patient Name: Ricardo Evans MRN: [id number] DOB:22-Jan-1981, 42 y.o., male Today's Date: 07/19/2023  END OF SESSION:  PT End of Session - 07/19/23 1519     Visit Number 5    Number of Visits 12    Date for PT Re-Evaluation 09/21/23    PT Start Time 1515    PT Stop Time 1601    PT Time Calculation (min) 46 min    Activity Tolerance Patient limited by pain    Behavior During Therapy [facility] for tasks assessed/performed                Past Medical History:  Diagnosis Date   Hypertension    Hypertensive heart disease    Systolic HF (heart failure) (HCC)    Past Surgical History:  Procedure Laterality Date   ANKLE ARTHROSCOPY Right 06/27/2023   Procedure: RIGHT ANKLE ARTHROSCOPY AND DEBRIDEMENT;  Surgeon: Nadara Mustard, MD;  Location: [facility] OR;  Service: Orthopedics;  Laterality: Right;   COLONOSCOPY WITH PROPOFOL N/A 04/05/2023   Procedure: COLONOSCOPY WITH PROPOFOL;  Surgeon: Corbin Ade, MD;  [facility];  Service: Endoscopy;  Laterality: N/A;  7:30 AM, ASA 3   POLYPECTOMY  04/05/2023   Procedure: POLYPECTOMY;  Surgeon: Corbin Ade, MD;  [facility]: [facility];  Service: Endoscopy;;   SPIGELIAN HERNIA     TONSILLECTOMY AND ADENOIDECTOMY     Patient Active Problem List   Diagnosis Date Noted   Impingement syndrome of right ankle 06/27/2023   Chronic systolic HF (heart failure) (HCC) 07/26/2021   Essential hypertension 06/29/2020   Abnormal CT lung screening 06/29/2020   Acute combined systolic and diastolic HF (heart failure) (HCC) 06/18/2020   Aortic root dilatation (HCC) 06/18/2020   Cardiomyopathy (HCC) 06/18/2020   Acute respiratory failure with hypoxemia (HCC) 05/31/2020   Hilar adenopathy 05/31/2020   Dyspnea and respiratory abnormalities 05/31/2020   Dyspnea 05/30/2020   Hypertensive urgency 05/30/2020   SPRAIN AND STRAIN OF METACARPOPHALANGEAL OF HAND 01/17/2010   REFERRING PROVIDER:  Nadara Mustard, MD   REFERRING DIAG: Pain in right ankle and joints of right foot; Synovitis of right ankle   THERAPY DIAG:  Pain in right ankle and joints of right foot  Stiffness of right ankle, not elsewhere classified  Localized edema  Rationale for Evaluation and Treatment: Rehabilitation  ONSET DATE: 06/27/23  SUBJECTIVE:   SUBJECTIVE STATEMENT: Patient reports that the outside of his ankle is really hurting today. He feels that this is the worst it has hurt in a while. He thinks it may have been due to the fact that his dog ran into his right ankle yesterday.   PERTINENT HISTORY: Hypertension and heart failure PAIN:  Are you having pain? Yes: NPRS scale: 8-9/10 Pain location: right medial ankle pain Pain description: sharp and pressure Aggravating factors: walking Relieving factors: elevating his foot  PRECAUTIONS: None  RED FLAGS: None   WEIGHT BEARING RESTRICTIONS: No  FALLS:  Has patient fallen in last 6 months? No  LIVING ENVIRONMENT: Lives with: lives alone Lives in: House/apartment Stairs: Yes: External: 3 steps; can reach both; reciprocal pattern ascending and step to pattern descending Has following equipment at home: None  OCCUPATION: planning to return to work on 07/09/23; job requires primarily walking on concrete floor, but is able to stop and sit if needed  PLOF: Independent  PATIENT GOALS: play golf and baseball (umpire), and be able to walk normally  NEXT  MD VISIT: 07/23/23  OBJECTIVE: all objective measures were assessed at her initial evaluation on 07/05/23 unless otherwise noted  PATIENT SURVEYS:  FOTO 39.39  COGNITION: Overall cognitive status: Within functional limits for tasks assessed     SENSATION: Patient reports no numbness or tingling  EDEMA:  Circumferential: L: 55.7 cm R: 60 cm   PALPATION: TTP: right plantar fascia   JOINT MOBILITY:  Right talocrural: hypomobile and painful  Right subtalar: hypomobile and  nonpainful   Right midfoot: hypomobile and painful   LOWER EXTREMITY ROM:  Active ROM Right eval Left eval  Hip flexion    Hip extension    Hip abduction    Hip adduction    Hip internal rotation    Hip external rotation    Knee flexion    Knee extension    Ankle dorsiflexion -7 5  Ankle plantarflexion 35 38  Ankle inversion 18 38  Ankle eversion 20 30   (Blank rows = not tested)  LOWER EXTREMITY MMT: not tested due to surgical condition  LOWER EXTREMITY SPECIAL TESTS:  Not tested due to surgical condition  GAIT: Assistive device utilized: None Level of assistance: Complete Independence Comments: Decreased stride length with decreased stance time on the right lower extremity, poor right foot toe off, in right foot flat at initial contact   TODAY'S TREATMENT:                                                                                                                              DATE:                                    07/19/23  EXERCISE LOG  Exercise Repetitions and Resistance Comments  Nustep  L3 x 15 minutes    Rocker board  Limited due to pain                Blank cell = exercise not performed today  Manual Therapy Soft Tissue Mobilization: right lateral ankle, for reduced edema Passive ROM: right ankle DF/PF, to tolerance   Modalities  Date:  Vaso: Ankle, 34 degrees; low pressure, 15 mins, Pain and Edema                                   07/17/23 EXERCISE LOG  Exercise Repetitions and Resistance Comments  Nustep  L4 x 15 minutes; seat 11   Rocker board  3 minutes each  DF/PF and inversion/eversion  Marching on BOSU  2 mins (ball up) Intermittent UE support   Lunge onto step  6" step x 10 reps Limited by sharp pain   Ankle circles  Dynadisc CW and CCW x 2 mins CW and CCW  ABCs 2 reps   R ankle PF  Green t-band x 20 reps  Blank cell = exercise not performed today                                    07/10/23 EXERCISE LOG  Exercise Repetitions and  Resistance Comments  Nustep  L3 x 15 minutes; seat 13    Rocker board  4 minutes    Circles  20 reps each  CW and CCW  BAPS  L2 x 2 minutes each  DF/PF and inversion/eversion       Blank cell = exercise not performed today  Modalities: no redness or adverse reaction to today's modalities  Date:  Vaso: Ankle, 34 degrees; low pressure, 15 mins, Pain and Edema  PATIENT EDUCATION:  Education details: Plan of care, healing, prognosis, edema, benefits of ice and elevation, and goals for therapy Person educated: Patient Education method: Explanation Education comprehension: verbalized understanding  HOME EXERCISE PROGRAM:   ASSESSMENT:  CLINICAL IMPRESSION: Patient presented to treatment with high pain severity and irritability which limited his ability with active interventions and manual therapy. Manual therapy focused on reduced edema for improved ankle mobility. However, he was limited by pain and tenderness to palpation. Modalities were the most effective at reducing his familiar edema and pain. He reported that his ankle felt better upon the conclusion of treatment. He continues to require skilled physical therapy to address his remaining impairments to return to his prior level of function.   OBJECTIVE IMPAIRMENTS: Abnormal gait, decreased activity tolerance, decreased balance, decreased mobility, difficulty walking, decreased ROM, decreased strength, hypomobility, increased edema, and pain.   ACTIVITY LIMITATIONS: carrying, lifting, standing, squatting, stairs, and locomotion level  PARTICIPATION LIMITATIONS: meal prep, cleaning, laundry, driving, shopping, community activity, occupation, and yard work  PERSONAL FACTORS: Transportation and 1-2 comorbidities: Hypertension and heart failure  are also affecting patient's functional outcome.   REHAB POTENTIAL: Good  CLINICAL DECISION MAKING: Stable/uncomplicated  EVALUATION COMPLEXITY: Low   GOALS: Goals reviewed with patient?  Yes  SHORT TERM GOALS: Target date: 07/26/23 Patient will be independent with his initial HEP. Baseline: Goal status: MET  2.  Patient will be able to complete his daily activities without his familiar pain exceeding 3/10. Baseline:  Goal status: ON GOING  3.  Patient will be able to demonstrate right ankle dorsiflexion to at least neutral for improved gait mechanics. Baseline:  Goal status: ON GOING  LONG TERM GOALS: Target date: 08/16/23  Patient will be independent with his advanced HEP. Baseline:  Goal status: ON GOING  2.  Patient will be able to ambulate with no significant gait deviations. Baseline:  Goal status: ON GOING  3.  Patient will be able to demonstrate at least 5 degrees of right ankle dorsiflexion for improved function squatting. Baseline:  Goal status: ON GOING  4.  Patient will be able to descend stairs with a reciprocal pattern for improved household mobility. Baseline:  Goal status: ON GOING  PLAN:  PT FREQUENCY: 2x/week  PT DURATION: 4 weeks  PLANNED INTERVENTIONS: Therapeutic exercises, Therapeutic activity, Neuromuscular re-education, Balance training, Gait training, Patient/Family education, Self Care, Joint mobilization, Stair training, Electrical stimulation, Cryotherapy, Moist heat, scar mobilization, Taping, Vasopneumatic device, Manual therapy, and Re-evaluation  PLAN FOR NEXT SESSION: NuStep, manual therapy, provide HEP as able, ankle active and passive range of motion, and modalities as needed   Granville Lewis, PT 07/19/2023, 4:22 PM

## 2023-07-20 ENCOUNTER — Telehealth: Payer: Self-pay | Admitting: Pulmonary Disease

## 2023-07-20 NOTE — Telephone Encounter (Signed)
Being faxed over to us.

## 2023-07-20 NOTE — Telephone Encounter (Signed)
Patient would like results of ONO. Patient phone number is [phone].

## 2023-07-23 ENCOUNTER — Telehealth: Payer: Self-pay | Admitting: Pulmonary Disease

## 2023-07-23 ENCOUNTER — Ambulatory Visit (INDEPENDENT_AMBULATORY_CARE_PROVIDER_SITE_OTHER): Payer: Commercial Managed Care - PPO | Admitting: Orthopedic Surgery

## 2023-07-23 DIAGNOSIS — M65971 Unspecified synovitis and tenosynovitis, right ankle and foot: Secondary | ICD-10-CM

## 2023-07-23 DIAGNOSIS — G4734 Idiopathic sleep related nonobstructive alveolar hypoventilation: Secondary | ICD-10-CM

## 2023-07-23 DIAGNOSIS — M659 Synovitis and tenosynovitis, unspecified: Secondary | ICD-10-CM

## 2023-07-23 NOTE — Telephone Encounter (Signed)
Patient wants to know if we've received his results back for his HST so that [employer] [facility] can come remove his machine from the house. Please call and advise [phone]

## 2023-07-24 ENCOUNTER — Encounter: Payer: Self-pay | Admitting: Orthopedic Surgery

## 2023-07-24 ENCOUNTER — Ambulatory Visit: Payer: Commercial Managed Care - PPO | Admitting: *Deleted

## 2023-07-24 NOTE — Progress Notes (Signed)
Office Visit Note   Patient: Ricardo Evans           Date of Birth: [birth year]           MRN: [id number] Visit Date: 07/23/2023              Requested by: [facility] [address] [place],  [place] [address] PCP: [facility]  Chief Complaint  Patient presents with   Right Ankle - Routine Post Op    06/27/23 right ankle scope and deb      HPI: Patient is seen in follow-up status post right ankle arthroscopy.  He is 3 weeks out from surgery.  Patient has been weightbearing in regular shoes and therapy.  Patient feels like he has overdone it with some physical therapy and has some swelling laterally at this time.  Assessment & Plan: Visit Diagnoses:  1. Synovitis of right ankle     Plan: Recommended continuing with physical therapy continue with compression socks.  Recommended Voltaren gel topically 3 times a day.  Follow-Up Instructions: Return in about 4 weeks (around 08/20/2023).   Ortho Exam  Patient is alert, oriented, no adenopathy, well-dressed, normal affect, normal respiratory effort. Examination patient does have increased swelling with no redness no drainage no signs of infection.  Imaging: No results found. No images are attached to the encounter.  Labs: Lab Results  Component Value Date   ESRSEDRATE 27 (H) 05/31/2020     Lab Results  Component Value Date   ALBUMIN 4.2 02/15/2023   ALBUMIN 4.1 02/11/2023   ALBUMIN 4.4 05/30/2020    Lab Results  Component Value Date   MG 1.7 05/30/2020   No results found for: "VD25OH"  No results found for: "PREALBUMIN"    Latest Ref Rng & Units 06/27/2023    8:32 AM 02/15/2023    6:01 PM 02/11/2023    7:24 AM  CBC EXTENDED  WBC 4.0 - 10.5 K/uL 6.9  5.8  11.6   RBC 4.22 - 5.81 MIL/uL 5.22  4.63  4.58   Hemoglobin 13.0 - 17.0 g/dL 78.2  95.6  21.3   HCT 39.0 - 52.0 % 48.1  43.2  41.4   Platelets 150 - 400 K/uL 148  186  155      There is no height or weight on file to calculate  BMI.  Orders:  No orders of the defined types were placed in this encounter.  No orders of the defined types were placed in this encounter.    Procedures: No procedures performed  Clinical Data: No additional findings.  ROS:  All other systems negative, except as noted in the HPI. Review of Systems  Objective: Vital Signs: There were no vitals taken for this visit.  Specialty Comments:  No specialty comments available.  PMFS History: Patient Active Problem List   Diagnosis Date Noted   Impingement syndrome of right ankle 06/27/2023   Chronic systolic HF (heart failure) (HCC) 07/26/2021   Essential hypertension 06/29/2020   Abnormal CT lung screening 06/29/2020   Acute combined systolic and diastolic HF (heart failure) (HCC) 06/18/2020   Aortic root dilatation (HCC) 06/18/2020   Cardiomyopathy (HCC) 06/18/2020   Acute respiratory failure with hypoxemia (HCC) 05/31/2020   Hilar adenopathy 05/31/2020   Dyspnea and respiratory abnormalities 05/31/2020   Dyspnea 05/30/2020   Hypertensive urgency 05/30/2020   SPRAIN AND STRAIN OF METACARPOPHALANGEAL OF HAND 01/17/2010   Past Medical History:  Diagnosis Date   Hypertension  Hypertensive heart disease    Systolic HF (heart failure) (HCC)     Family History  Problem Relation Age of Onset   Suicidality Mother    COPD Father    Heart disease Maternal Grandmother    Diverticulitis Maternal Uncle    Cancer - Colon Neg Hx    Colon polyps Neg Hx     Past Surgical History:  Procedure Laterality Date   ANKLE ARTHROSCOPY Right 06/27/2023   Procedure: RIGHT ANKLE ARTHROSCOPY AND DEBRIDEMENT;  Surgeon: Nadara Mustard, MD;  Location: [facility] OR;  Service: Orthopedics;  Laterality: Right;   COLONOSCOPY WITH PROPOFOL N/A 04/05/2023   Procedure: COLONOSCOPY WITH PROPOFOL;  Surgeon: Corbin Ade, MD;  [facility];  Service: Endoscopy;  Laterality: N/A;  7:30 AM, ASA 3   POLYPECTOMY  04/05/2023   Procedure: POLYPECTOMY;   Surgeon: Corbin Ade, MD;  [facility];  Service: Endoscopy;;   SPIGELIAN HERNIA     TONSILLECTOMY AND ADENOIDECTOMY     Social History   Occupational History   Not on file  Tobacco Use   Smoking status: Never   Smokeless tobacco: Never  Vaping Use   Vaping status: Never Used  Substance and Sexual Activity   Alcohol use: Not Currently   Drug use: Never   Sexual activity: Yes

## 2023-07-25 NOTE — Telephone Encounter (Signed)
Patient is calling back wanting the result of his sleep study so that it can be sent to [employer] [facility] so that they can remove the oxygen equipment from his house. He has to pay for it every month that it is with him. Please call back [phone]

## 2023-07-26 ENCOUNTER — Ambulatory Visit: Payer: Commercial Managed Care - PPO | Attending: Orthopedic Surgery

## 2023-07-26 DIAGNOSIS — M25671 Stiffness of right ankle, not elsewhere classified: Secondary | ICD-10-CM | POA: Diagnosis present

## 2023-07-26 DIAGNOSIS — R600 Localized edema: Secondary | ICD-10-CM | POA: Diagnosis present

## 2023-07-26 DIAGNOSIS — M25571 Pain in right ankle and joints of right foot: Secondary | ICD-10-CM | POA: Diagnosis present

## 2023-07-26 NOTE — Therapy (Signed)
OUTPATIENT PHYSICAL THERAPY LOWER EXTREMITY TREATMENT   Patient Name: Ricardo Evans MRN: [id number] DOB:1981-07-30, 42 y.o., male Today's Date: 07/26/2023  END OF SESSION:  PT End of Session - 07/26/23 1519     Visit Number 6    Number of Visits 12    Date for PT Re-Evaluation 09/21/23    PT Start Time 1515    PT Stop Time 1558    PT Time Calculation (min) 43 min    Activity Tolerance Patient tolerated treatment well    Behavior During Therapy [facility] for tasks assessed/performed                 Past Medical History:  Diagnosis Date   Hypertension    Hypertensive heart disease    Systolic HF (heart failure) (HCC)    Past Surgical History:  Procedure Laterality Date   ANKLE ARTHROSCOPY Right 06/27/2023   Procedure: RIGHT ANKLE ARTHROSCOPY AND DEBRIDEMENT;  Surgeon: Nadara Mustard, MD;  Location: [facility] OR;  Service: Orthopedics;  Laterality: Right;   COLONOSCOPY WITH PROPOFOL N/A 04/05/2023   Procedure: COLONOSCOPY WITH PROPOFOL;  Surgeon: Corbin Ade, MD;  Location: [facility];  Service: Endoscopy;  Laterality: N/A;  7:30 AM, ASA 3   POLYPECTOMY  04/05/2023   Procedure: POLYPECTOMY;  Surgeon: Corbin Ade, MD;  Location: [facility];  Service: Endoscopy;;   SPIGELIAN HERNIA     TONSILLECTOMY AND ADENOIDECTOMY     Patient Active Problem List   Diagnosis Date Noted   Impingement syndrome of right ankle 06/27/2023   Chronic systolic HF (heart failure) (HCC) 07/26/2021   Essential hypertension 06/29/2020   Abnormal CT lung screening 06/29/2020   Acute combined systolic and diastolic HF (heart failure) (HCC) 06/18/2020   Aortic root dilatation (HCC) 06/18/2020   Cardiomyopathy (HCC) 06/18/2020   Acute respiratory failure with hypoxemia (HCC) 05/31/2020   Hilar adenopathy 05/31/2020   Dyspnea and respiratory abnormalities 05/31/2020   Dyspnea 05/30/2020   Hypertensive urgency 05/30/2020   SPRAIN AND STRAIN OF METACARPOPHALANGEAL OF HAND 01/17/2010   REFERRING  PROVIDER: Nadara Mustard, MD   REFERRING DIAG: Pain in right ankle and joints of right foot; Synovitis of right ankle   THERAPY DIAG:  Pain in right ankle and joints of right foot  Stiffness of right ankle, not elsewhere classified  Localized edema  Rationale for Evaluation and Treatment: Rehabilitation  ONSET DATE: 06/27/23  SUBJECTIVE:   SUBJECTIVE STATEMENT: Patient reports that he feels a lot better now. He was given a topical anti-inflammatory cream which has really helped. He has noticed that his walking is getting better.   PERTINENT HISTORY: Hypertension and heart failure PAIN:  Are you having pain? Yes: NPRS scale: 0/10 Pain location: right medial ankle pain Pain description: sharp and pressure Aggravating factors: walking Relieving factors: elevating his foot  PRECAUTIONS: None  RED FLAGS: None   WEIGHT BEARING RESTRICTIONS: No  FALLS:  Has patient fallen in last 6 months? No  LIVING ENVIRONMENT: Lives with: lives alone Lives in: House/apartment Stairs: Yes: External: 3 steps; can reach both; reciprocal pattern ascending and step to pattern descending Has following equipment at home: None  OCCUPATION: planning to return to work on 07/09/23; job requires primarily walking on concrete floor, but is able to stop and sit if needed  PLOF: Independent  PATIENT GOALS: play golf and baseball (umpire), and be able to walk normally  NEXT MD VISIT: 07/23/23  OBJECTIVE: all objective measures were assessed at her initial evaluation  on 07/05/23 unless otherwise noted  PATIENT SURVEYS:  FOTO 39.39  COGNITION: Overall cognitive status: Within functional limits for tasks assessed     SENSATION: Patient reports no numbness or tingling  EDEMA:  Circumferential: L: 55.7 cm R: 60 cm   PALPATION: TTP: right plantar fascia   JOINT MOBILITY:  Right talocrural: hypomobile and painful  Right subtalar: hypomobile and nonpainful   Right midfoot: hypomobile and  painful   LOWER EXTREMITY ROM:  Active ROM Right eval Left eval  Hip flexion    Hip extension    Hip abduction    Hip adduction    Hip internal rotation    Hip external rotation    Knee flexion    Knee extension    Ankle dorsiflexion -7 5  Ankle plantarflexion 35 38  Ankle inversion 18 38  Ankle eversion 20 30   (Blank rows = not tested)  LOWER EXTREMITY MMT: not tested due to surgical condition  LOWER EXTREMITY SPECIAL TESTS:  Not tested due to surgical condition  GAIT: Assistive device utilized: None Level of assistance: Complete Independence Comments: Decreased stride length with decreased stance time on the right lower extremity, poor right foot toe off, in right foot flat at initial contact   TODAY'S TREATMENT:                                                                                                                              DATE:                                     07/26/23 EXERCISE LOG  Exercise Repetitions and Resistance Comments  Nustep  L3 x 16 minutes    Standing gastroc stretch  4 x 30 seconds   Standing soleus stretch  4 x 30 seconds    Standing heel raise  25 reps    Lunge to march  25 reps each  For push off for running   Side stepping  2 minutes    SL sit to stand 20 reps  RLE only    Blank cell = exercise not performed today                                   07/19/23  EXERCISE LOG  Exercise Repetitions and Resistance Comments  Nustep  L3 x 15 minutes    Rocker board  Limited due to pain                Blank cell = exercise not performed today  Manual Therapy Soft Tissue Mobilization: right lateral ankle, for reduced edema Passive ROM: right ankle DF/PF, to tolerance   Modalities  Date:  Vaso: Ankle, 34 degrees; low pressure, 15 mins, Pain and Edema  07/17/23 EXERCISE LOG  Exercise Repetitions and Resistance Comments  Nustep  L4 x 15 minutes; seat 11   Rocker board  3 minutes each  DF/PF and  inversion/eversion  Marching on BOSU  2 mins (ball up) Intermittent UE support   Lunge onto step  6" step x 10 reps Limited by sharp pain   Ankle circles  Dynadisc CW and CCW x 2 mins CW and CCW  ABCs 2 reps   R ankle PF  Green t-band x 20 reps     Blank cell = exercise not performed today   PATIENT EDUCATION:  Education details: Plan of care, healing, prognosis, edema, benefits of ice and elevation, and goals for therapy Person educated: Patient Education method: Explanation Education comprehension: verbalized understanding  HOME EXERCISE PROGRAM:   ASSESSMENT:  CLINICAL IMPRESSION: Treatment focused on interventions for improved ankle mobility needed for improved gait mechanics. He required minimal cueing with single leg sit to stands to avoid utilizing his left lower extremity to facilitate improved stability in single leg stance. He experienced no increase in pain or discomfort with any of today's interventions. He reported that his ankle felt "that this is the best he has felt" upon the conclusion of treatment. He continues to require skilled physical therapy to address his remaining impairments to return to his prior level of function.     OBJECTIVE IMPAIRMENTS: Abnormal gait, decreased activity tolerance, decreased balance, decreased mobility, difficulty walking, decreased ROM, decreased strength, hypomobility, increased edema, and pain.   ACTIVITY LIMITATIONS: carrying, lifting, standing, squatting, stairs, and locomotion level  PARTICIPATION LIMITATIONS: meal prep, cleaning, laundry, driving, shopping, community activity, occupation, and yard work  PERSONAL FACTORS: Transportation and 1-2 comorbidities: Hypertension and heart failure  are also affecting patient's functional outcome.   REHAB POTENTIAL: Good  CLINICAL DECISION MAKING: Stable/uncomplicated  EVALUATION COMPLEXITY: Low   GOALS: Goals reviewed with patient? Yes  SHORT TERM GOALS: Target date:  07/26/23 Patient will be independent with his initial HEP. Baseline: Goal status: MET  2.  Patient will be able to complete his daily activities without his familiar pain exceeding 3/10. Baseline:  Goal status: ON GOING  3.  Patient will be able to demonstrate right ankle dorsiflexion to at least neutral for improved gait mechanics. Baseline:  Goal status: ON GOING  LONG TERM GOALS: Target date: 08/16/23  Patient will be independent with his advanced HEP. Baseline:  Goal status: ON GOING  2.  Patient will be able to ambulate with no significant gait deviations. Baseline:  Goal status: ON GOING  3.  Patient will be able to demonstrate at least 5 degrees of right ankle dorsiflexion for improved function squatting. Baseline:  Goal status: ON GOING  4.  Patient will be able to descend stairs with a reciprocal pattern for improved household mobility. Baseline:  Goal status: ON GOING  PLAN:  PT FREQUENCY: 2x/week  PT DURATION: 4 weeks  PLANNED INTERVENTIONS: Therapeutic exercises, Therapeutic activity, Neuromuscular re-education, Balance training, Gait training, Patient/Family education, Self Care, Joint mobilization, Stair training, Electrical stimulation, Cryotherapy, Moist heat, scar mobilization, Taping, Vasopneumatic device, Manual therapy, and Re-evaluation  PLAN FOR NEXT SESSION: NuStep, manual therapy, provide HEP as able, ankle active and passive range of motion, and modalities as needed   Granville Lewis, PT 07/26/2023, 4:20 PM

## 2023-07-30 NOTE — Telephone Encounter (Signed)
Patient checking on message for oxygen. Patient phone number is [phone].

## 2023-07-31 ENCOUNTER — Ambulatory Visit: Payer: Commercial Managed Care - PPO | Admitting: *Deleted

## 2023-07-31 DIAGNOSIS — M25571 Pain in right ankle and joints of right foot: Secondary | ICD-10-CM | POA: Diagnosis not present

## 2023-07-31 DIAGNOSIS — M25671 Stiffness of right ankle, not elsewhere classified: Secondary | ICD-10-CM

## 2023-07-31 DIAGNOSIS — R600 Localized edema: Secondary | ICD-10-CM

## 2023-07-31 NOTE — Therapy (Signed)
OUTPATIENT PHYSICAL THERAPY LOWER EXTREMITY TREATMENT   Patient Name: Ricardo Evans MRN: [id number] DOB:[birth year], 42 y.o., male Today's Date: 07/31/2023  END OF SESSION:  PT End of Session - 07/31/23 1517     Visit Number 7    Number of Visits 12    Date for PT Re-Evaluation 09/21/23    PT Start Time 1517    PT Stop Time 1604    PT Time Calculation (min) 47 min                 Past Medical History:  Diagnosis Date   Hypertension    Hypertensive heart disease    Systolic HF (heart failure) (HCC)    Past Surgical History:  Procedure Laterality Date   ANKLE ARTHROSCOPY Right 06/27/2023   Procedure: RIGHT ANKLE ARTHROSCOPY AND DEBRIDEMENT;  Surgeon: Nadara Mustard, MD;  Location: [facility] OR;  Service: Orthopedics;  Laterality: Right;   COLONOSCOPY WITH PROPOFOL N/A 04/05/2023   Procedure: COLONOSCOPY WITH PROPOFOL;  Surgeon: Corbin Ade, MD;  Location: [facility];  Service: Endoscopy;  Laterality: N/A;  7:30 AM, ASA 3   POLYPECTOMY  04/05/2023   Procedure: POLYPECTOMY;  Surgeon: Corbin Ade, MD;  Location: [facility];  Service: Endoscopy;;   SPIGELIAN HERNIA     TONSILLECTOMY AND ADENOIDECTOMY     Patient Active Problem List   Diagnosis Date Noted   Impingement syndrome of right ankle 06/27/2023   Chronic systolic HF (heart failure) (HCC) 07/26/2021   Essential hypertension 06/29/2020   Abnormal CT lung screening 06/29/2020   Acute combined systolic and diastolic HF (heart failure) (HCC) 06/18/2020   Aortic root dilatation (HCC) 06/18/2020   Cardiomyopathy (HCC) 06/18/2020   Acute respiratory failure with hypoxemia (HCC) 05/31/2020   Hilar adenopathy 05/31/2020   Dyspnea and respiratory abnormalities 05/31/2020   Dyspnea 05/30/2020   Hypertensive urgency 05/30/2020   SPRAIN AND STRAIN OF METACARPOPHALANGEAL OF HAND 01/17/2010   REFERRING PROVIDER: Nadara Mustard, MD   REFERRING DIAG: Pain in right ankle and joints of right foot; Synovitis of right  ankle   THERAPY DIAG:  Pain in right ankle and joints of right foot  Stiffness of right ankle, not elsewhere classified  Localized edema  Rationale for Evaluation and Treatment: Rehabilitation  ONSET DATE: 06/27/23  SUBJECTIVE:   SUBJECTIVE STATEMENT: 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 Yes: NPRS scale: 0/10 Pain location: right medial ankle pain Pain description: sharp and pressure Aggravating factors: walking Relieving factors: elevating his foot  PRECAUTIONS: None  RED FLAGS: None   WEIGHT BEARING RESTRICTIONS: No  FALLS:  Has patient fallen in last 6 months? No  LIVING ENVIRONMENT: Lives with: lives alone Lives in: House/apartment Stairs: Yes: External: 3 steps; can reach both; reciprocal pattern ascending and step to pattern descending Has following equipment at home: None  OCCUPATION: planning to return to work on 07/09/23; job requires primarily walking on concrete floor, but is able to stop and sit if needed  PLOF: Independent  PATIENT GOALS: play golf and baseball (umpire), and be able to walk normally  NEXT MD VISIT: 07/23/23  OBJECTIVE: all objective measures were assessed at her initial evaluation on 07/05/23 unless otherwise noted  PATIENT SURVEYS:  FOTO 39.39  COGNITION: Overall cognitive status: Within functional limits for tasks assessed     SENSATION:  Patient reports no numbness or tingling  EDEMA:  Circumferential: L: 55.7 cm R: 60 cm   PALPATION: TTP: right plantar fascia   JOINT MOBILITY:  Right talocrural: hypomobile and painful  Right subtalar: hypomobile and nonpainful   Right midfoot: hypomobile and painful   LOWER EXTREMITY ROM:  Active ROM Right eval Left eval  Hip flexion    Hip extension    Hip abduction    Hip adduction    Hip internal  rotation    Hip external rotation    Knee flexion    Knee extension    Ankle dorsiflexion -7 5  Ankle plantarflexion 35 38  Ankle inversion 18 38  Ankle eversion 20 30   (Blank rows = not tested)  LOWER EXTREMITY MMT: not tested due to surgical condition  LOWER EXTREMITY SPECIAL TESTS:  Not tested due to surgical condition  GAIT: Assistive device utilized: None Level of assistance: Complete Independence Comments: Decreased stride length with decreased stance time on the right lower extremity, poor right foot toe off, in right foot flat at initial contact   TODAY'S TREATMENT:                                                                                                                              DATE:                                     07/31/23 EXERCISE LOG     RT ankle  Exercise Repetitions and Resistance Comments  Nustep  L3 x 16 minutes    Heelups/Toe ups at door 3x fatigue   Standing gastroc stretch     Standing soleus stretch     Standing heel raise     Lunge on 14 in box 25 reps each  For push off for running   Side stepping     SLS On airex pad   x 3 mins RLE only   Rocker board  DF/PF, side/ side, Balance   Ankle isolator 1 1/2#'s  CW, CCW DF x 3 mins    Blank cell = exercise not performed today                                   07/19/23  EXERCISE LOG  Exercise Repetitions and Resistance Comments  Nustep  L3 x 15 minutes    Rocker board  Limited due to pain                Blank cell = exercise not performed today  Manual Therapy Soft Tissue Mobilization: right lateral ankle, for reduced edema Passive ROM: right ankle DF/PF, to tolerance   Modalities  Date:  Vaso: Ankle, 34 degrees; low pressure, 15 mins, Pain and Edema  07/17/23 EXERCISE LOG  Exercise Repetitions and Resistance Comments  Nustep  L4 x 15 minutes; seat 11   Rocker board  3 minutes each  DF/PF and inversion/eversion  Marching on BOSU  2 mins (ball up)  Intermittent UE support   Lunge onto step  6" step x 10 reps Limited by sharp pain   Ankle circles  Dynadisc CW and CCW x 2 mins CW and CCW  ABCs 2 reps   R ankle PF  Green t-band x 20 reps     Blank cell = exercise not performed today   PATIENT EDUCATION:  Education details: Plan of care, healing, prognosis, edema, benefits of ice and elevation, and goals for therapy Person educated: Patient Education method: Explanation Education comprehension: verbalized understanding  HOME EXERCISE PROGRAM:   ASSESSMENT:  CLINICAL IMPRESSION: RX focused on RT ankle therex for ROM, strengthening, and  proprioception act.'s and did great. Mainly fatigue end of session and no increased pain.        OBJECTIVE IMPAIRMENTS: Abnormal gait, decreased activity tolerance, decreased balance, decreased mobility, difficulty walking, decreased ROM, decreased strength, hypomobility, increased edema, and pain.   ACTIVITY LIMITATIONS: carrying, lifting, standing, squatting, stairs, and locomotion level  PARTICIPATION LIMITATIONS: meal prep, cleaning, laundry, driving, shopping, community activity, occupation, and yard work  PERSONAL FACTORS: Transportation and 1-2 comorbidities: Hypertension and heart failure  are also affecting patient's functional outcome.   REHAB POTENTIAL: Good  CLINICAL DECISION MAKING: Stable/uncomplicated  EVALUATION COMPLEXITY: Low   GOALS: Goals reviewed with patient? Yes  SHORT TERM GOALS: Target date: 07/26/23 Patient will be independent with his initial HEP. Baseline: Goal status: MET  2.  Patient will be able to complete his daily activities without his familiar pain exceeding 3/10. Baseline:  Goal status: ON GOING  3.  Patient will be able to demonstrate right ankle dorsiflexion to at least neutral for improved gait mechanics. Baseline:  Goal status: ON GOING  LONG TERM GOALS: Target date: 08/16/23  Patient will be independent with his advanced HEP. Baseline:   Goal status: ON GOING  2.  Patient will be able to ambulate with no significant gait deviations. Baseline:  Goal status: ON GOING  3.  Patient will be able to demonstrate at least 5 degrees of right ankle dorsiflexion for improved function squatting. Baseline:  Goal status: ON GOING  4.  Patient will be able to descend stairs with a reciprocal pattern for improved household mobility. Baseline:  Goal status: ON GOING  PLAN:  PT FREQUENCY: 2x/week  PT DURATION: 4 weeks  PLANNED INTERVENTIONS: Therapeutic exercises, Therapeutic activity, Neuromuscular re-education, Balance training, Gait training, Patient/Family education, Self Care, Joint mobilization, Stair training, Electrical stimulation, Cryotherapy, Moist heat, scar mobilization, Taping, Vasopneumatic device, Manual therapy, and Re-evaluation  PLAN FOR NEXT SESSION: NuStep, manual therapy, provide HEP as able, ankle active and passive range of motion, and modalities as needed   Jabes Primo,CHRIS, PTA 07/31/2023, 4:24 PM

## 2023-08-02 ENCOUNTER — Ambulatory Visit: Payer: Commercial Managed Care - PPO

## 2023-08-02 DIAGNOSIS — M25571 Pain in right ankle and joints of right foot: Secondary | ICD-10-CM | POA: Diagnosis not present

## 2023-08-02 DIAGNOSIS — R600 Localized edema: Secondary | ICD-10-CM

## 2023-08-02 DIAGNOSIS — M25671 Stiffness of right ankle, not elsewhere classified: Secondary | ICD-10-CM

## 2023-08-02 NOTE — Telephone Encounter (Signed)
Ricardo Evans results here at [facility].

## 2023-08-02 NOTE — Telephone Encounter (Signed)
Received ONO on RA dated 07/16/24  Pt desaturated <=88% for total of 16 min and 5 sec and <=89% for 1 hour and 12 sec.  Dr Craige Cotta ordered this study Pt asking to d/c o2   Routing to Tammy (app of the day) Please advise, thanks!

## 2023-08-02 NOTE — Telephone Encounter (Signed)
Patient advised of overnight oximetry test result. Patient requested to have oxygen picked up. He reports he will not use it, and has not used in the last four months. Advised again of potential complications of low oxygen. He again requested to have oxygen removed from his home. DME order placed to have oxygen discontinued.

## 2023-08-02 NOTE — Telephone Encounter (Signed)
Overnight oximetry test July 17, 2023 does showed that he continues to have lower oxygen levels for total time of test he had 16 minutes that were less than 88% this would indicate he can needs to continue on oxygen 2 L at bedtime.   Low oxygen level being a lack of oxygen to his brain and lack of oxygen at heart and over time that can lead to congestive heart failure, memory impairment and heart arrhythmias. Would recommend continue on oxygen 2 L at bedtime.  If patient declines and wants to get rid of his oxygen just please explain to him the potential complications and send order for DC oxygen.  Follow-up in our office as needed if he continues on oxygen please set him up for a follow-up visit with first available provider as it has been a year since he has been seen

## 2023-08-02 NOTE — Addendum Note (Signed)
Addended by: Hyacinth Meeker on: 08/02/2023 01:43 PM   Modules accepted: Orders

## 2023-08-02 NOTE — Therapy (Signed)
OUTPATIENT PHYSICAL THERAPY LOWER EXTREMITY TREATMENT   Patient Name: Ricardo Evans MRN: [id number] DOB:[birth year], 42 y.o., male Today's Date: 08/02/2023  END OF SESSION:  PT End of Session - 08/02/23 0802     Visit Number 8    Number of Visits 12    Date for PT Re-Evaluation 09/21/23    PT Start Time 0800    PT Stop Time 0841    PT Time Calculation (min) 41 min                 Past Medical History:  Diagnosis Date   Hypertension    Hypertensive heart disease    Systolic HF (heart failure) (HCC)    Past Surgical History:  Procedure Laterality Date   ANKLE ARTHROSCOPY Right 06/27/2023   Procedure: RIGHT ANKLE ARTHROSCOPY AND DEBRIDEMENT;  Surgeon: Nadara Mustard, MD;  Location: [facility] OR;  Service: Orthopedics;  Laterality: Right;   COLONOSCOPY WITH PROPOFOL N/A 04/05/2023   Procedure: COLONOSCOPY WITH PROPOFOL;  Surgeon: Corbin Ade, MD;  Location: [facility];  Service: Endoscopy;  Laterality: N/A;  7:30 AM, ASA 3   POLYPECTOMY  04/05/2023   Procedure: POLYPECTOMY;  Surgeon: Corbin Ade, MD;  Location: [facility];  Service: Endoscopy;;   SPIGELIAN HERNIA     TONSILLECTOMY AND ADENOIDECTOMY     Patient Active Problem List   Diagnosis Date Noted   Impingement syndrome of right ankle 06/27/2023   Chronic systolic HF (heart failure) (HCC) 07/26/2021   Essential hypertension 06/29/2020   Abnormal CT lung screening 06/29/2020   Acute combined systolic and diastolic HF (heart failure) (HCC) 06/18/2020   Aortic root dilatation (HCC) 06/18/2020   Cardiomyopathy (HCC) 06/18/2020   Acute respiratory failure with hypoxemia (HCC) 05/31/2020   Hilar adenopathy 05/31/2020   Dyspnea and respiratory abnormalities 05/31/2020   Dyspnea 05/30/2020   Hypertensive urgency 05/30/2020   SPRAIN AND STRAIN OF METACARPOPHALANGEAL OF HAND 01/17/2010   REFERRING PROVIDER: Nadara Mustard, MD   REFERRING DIAG: Pain in right ankle and joints of right foot; Synovitis of right  ankle   THERAPY DIAG:  Pain in right ankle and joints of right foot  Stiffness of right ankle, not elsewhere classified  Localized edema  Rationale for Evaluation and Treatment: Rehabilitation  ONSET DATE: 06/27/23  SUBJECTIVE:   SUBJECTIVE STATEMENT: Pt denies any pain today.  Pt reports that he went to the fair last [place] for about 3 hours without any issue.   PERTINENT HISTORY: Hypertension and heart failure  PAIN:  Are you having pain? Yes: NPRS scale: 0/10 Pain location: right medial ankle pain Pain description: sharp and pressure Aggravating factors: walking Relieving factors: elevating his foot  PRECAUTIONS: None  RED FLAGS: None   WEIGHT BEARING RESTRICTIONS: No  FALLS:  Has patient fallen in last 6 months? No  LIVING ENVIRONMENT: Lives with: lives alone Lives in: House/apartment Stairs: Yes: External: 3 steps; can reach both; reciprocal pattern ascending and step to pattern descending Has following equipment at home: None  OCCUPATION: planning to return to work on 07/09/23; job requires primarily walking on concrete floor, but is able to stop and sit if needed  PLOF: Independent  PATIENT GOALS: play golf and baseball (umpire), and be able to walk normally  NEXT MD VISIT: 07/23/23  OBJECTIVE: all objective measures were assessed at her initial evaluation on 07/05/23 unless otherwise noted  PATIENT SURVEYS:  FOTO 39.39  COGNITION: Overall cognitive status: Within functional limits for tasks assessed  SENSATION: Patient reports no numbness or tingling  EDEMA:  Circumferential: L: 55.7 cm R: 60 cm   PALPATION: TTP: right plantar fascia   JOINT MOBILITY:  Right talocrural: hypomobile and painful  Right subtalar: hypomobile and nonpainful   Right midfoot: hypomobile and painful   LOWER EXTREMITY ROM:  Active ROM Right eval Left eval  Hip flexion    Hip extension    Hip abduction    Hip adduction    Hip internal rotation    Hip  external rotation    Knee flexion    Knee extension    Ankle dorsiflexion -7 5  Ankle plantarflexion 35 38  Ankle inversion 18 38  Ankle eversion 20 30   (Blank rows = not tested)  LOWER EXTREMITY MMT: not tested due to surgical condition  LOWER EXTREMITY SPECIAL TESTS:  Not tested due to surgical condition  GAIT: Assistive device utilized: None Level of assistance: Complete Independence Comments: Decreased stride length with decreased stance time on the right lower extremity, poor right foot toe off, in right foot flat at initial contact   TODAY'S TREATMENT:                                                                                                                              DATE:                                     08/02/23 EXERCISE LOG     RT ankle  Exercise Repetitions and Resistance Comments  Nustep  L4 x 16 minutes    Heelups/Toe ups at door 3x fatigue   Standing gastroc stretch     Standing soleus stretch     Standing heel raise     Lunge on 14 in box 4 mins For push off for running   Side stepping     SLS 5 cones x 3 reps  RLE only   Rocker board  DF/PF, side/ side, x 3.5 mins each   Ankle isolator 1 1/2#'s  CW, CCW DF x 3.5 mins    Blank cell = exercise not performed today                                   07/19/23  EXERCISE LOG  Exercise Repetitions and Resistance Comments  Nustep  L3 x 15 minutes    Rocker board  Limited due to pain                Blank cell = exercise not performed today  Manual Therapy Soft Tissue Mobilization: right lateral ankle, for reduced edema Passive ROM: right ankle DF/PF, to tolerance   Modalities  Date:  Vaso: Ankle, 34 degrees; low pressure, 15 mins, Pain and Edema  PATIENT EDUCATION:  Education details: Plan of care, healing, prognosis, edema, benefits of ice and elevation, and goals for therapy Person educated: Patient Education method: Explanation Education comprehension:  verbalized understanding  HOME EXERCISE PROGRAM:   ASSESSMENT:  CLINICAL IMPRESSION: Pt arrives for today's treatment session denying any pain.  Pt reports that he was able to go to the fair for 3 hours yesterday without any swelling or pain.  Pt able to tolerate increased time with all exercises today without issue or complaint of pain.  Pt able to demonstrate 2 degrees of dorsiflexion, meeting his short term goal and making good progress towards his long term goal.  Pt denied any pain at completion of today's treatment session.      OBJECTIVE IMPAIRMENTS: Abnormal gait, decreased activity tolerance, decreased balance, decreased mobility, difficulty walking, decreased ROM, decreased strength, hypomobility, increased edema, and pain.   ACTIVITY LIMITATIONS: carrying, lifting, standing, squatting, stairs, and locomotion level  PARTICIPATION LIMITATIONS: meal prep, cleaning, laundry, driving, shopping, community activity, occupation, and yard work  PERSONAL FACTORS: Transportation and 1-2 comorbidities: Hypertension and heart failure  are also affecting patient's functional outcome.   REHAB POTENTIAL: Good  CLINICAL DECISION MAKING: Stable/uncomplicated  EVALUATION COMPLEXITY: Low   GOALS: Goals reviewed with patient? Yes  SHORT TERM GOALS: Target date: 07/26/23 Patient will be independent with his initial HEP. Baseline: Goal status: MET  2.  Patient will be able to complete his daily activities without his familiar pain exceeding 3/10. Baseline:  Goal status: MET  3.  Patient will be able to demonstrate right ankle dorsiflexion to at least neutral for improved gait mechanics. Baseline:  Goal status: MET  LONG TERM GOALS: Target date: 08/16/23  Patient will be independent with his advanced HEP. Baseline:  Goal status: MET  2.  Patient will be able to ambulate with no significant gait deviations. Baseline:  Goal status: MET  3.  Patient will be able to demonstrate at  least 5 degrees of right ankle dorsiflexion for improved function squatting. Baseline: 10/10: 2 degrees Goal status: ON GOING  4.  Patient will be able to descend stairs with a reciprocal pattern for improved household mobility. Baseline:  Goal status: ON GOING  PLAN:  PT FREQUENCY: 2x/week  PT DURATION: 4 weeks  PLANNED INTERVENTIONS: Therapeutic exercises, Therapeutic activity, Neuromuscular re-education, Balance training, Gait training, Patient/Family education, Self Care, Joint mobilization, Stair training, Electrical stimulation, Cryotherapy, Moist heat, scar mobilization, Taping, Vasopneumatic device, Manual therapy, and Re-evaluation  PLAN FOR NEXT SESSION: NuStep, manual therapy, provide HEP as able, ankle active and passive range of motion, and modalities as needed   Newman Pies, PTA 08/02/2023, 9:24 AM

## 2023-08-14 ENCOUNTER — Ambulatory Visit: Payer: Commercial Managed Care - PPO

## 2023-08-14 DIAGNOSIS — M25671 Stiffness of right ankle, not elsewhere classified: Secondary | ICD-10-CM

## 2023-08-14 DIAGNOSIS — M25571 Pain in right ankle and joints of right foot: Secondary | ICD-10-CM

## 2023-08-14 DIAGNOSIS — R600 Localized edema: Secondary | ICD-10-CM

## 2023-08-14 NOTE — Therapy (Signed)
OUTPATIENT PHYSICAL THERAPY LOWER EXTREMITY TREATMENT   Patient Name: Ricardo Evans MRN: [id number] DOB:Dec 07, 1980, 42 y.o., male Today's Date: 08/14/2023  END OF SESSION:  PT End of Session - 08/14/23 0806     Visit Number 9    Number of Visits 12    Date for PT Re-Evaluation 09/21/23    PT Start Time 0800    PT Stop Time 0812    PT Time Calculation (min) 12 min    Activity Tolerance Patient tolerated treatment well    Behavior During Therapy [facility] for tasks assessed/performed                  Past Medical History:  Diagnosis Date   Hypertension    Hypertensive heart disease    Systolic HF (heart failure) (HCC)    Past Surgical History:  Procedure Laterality Date   ANKLE ARTHROSCOPY Right 06/27/2023   Procedure: RIGHT ANKLE ARTHROSCOPY AND DEBRIDEMENT;  Surgeon: Nadara Mustard, MD;  Location: [facility] OR;  Service: Orthopedics;  Laterality: Right;   COLONOSCOPY WITH PROPOFOL N/A 04/05/2023   Procedure: COLONOSCOPY WITH PROPOFOL;  Surgeon: Corbin Ade, MD;  Location: [facility];  Service: Endoscopy;  Laterality: N/A;  7:30 AM, ASA 3   POLYPECTOMY  04/05/2023   Procedure: POLYPECTOMY;  Surgeon: Corbin Ade, MD;  Location: [facility];  Service: Endoscopy;;   SPIGELIAN HERNIA     TONSILLECTOMY AND ADENOIDECTOMY     Patient Active Problem List   Diagnosis Date Noted   Impingement syndrome of right ankle 06/27/2023   Chronic systolic HF (heart failure) (HCC) 07/26/2021   Essential hypertension 06/29/2020   Abnormal CT lung screening 06/29/2020   Acute combined systolic and diastolic HF (heart failure) (HCC) 06/18/2020   Aortic root dilatation (HCC) 06/18/2020   Cardiomyopathy (HCC) 06/18/2020   Acute respiratory failure with hypoxemia (HCC) 05/31/2020   Hilar adenopathy 05/31/2020   Dyspnea and respiratory abnormalities 05/31/2020   Dyspnea 05/30/2020   Hypertensive urgency 05/30/2020   SPRAIN AND STRAIN OF METACARPOPHALANGEAL OF HAND 01/17/2010    REFERRING PROVIDER: Nadara Mustard, MD   REFERRING DIAG: Pain in right ankle and joints of right foot; Synovitis of right ankle   THERAPY DIAG:  Pain in right ankle and joints of right foot  Stiffness of right ankle, not elsewhere classified  Localized edema  Rationale for Evaluation and Treatment: Rehabilitation  ONSET DATE: 06/27/23  SUBJECTIVE:   SUBJECTIVE STATEMENT: Patient reports that he was able to walk over 6 miles over the weekend and did not have any problems with his ankle. He feels that he may not have to have any more therapy.   PERTINENT HISTORY: Hypertension and heart failure  PAIN:  Are you having pain? Yes: NPRS scale: 0/10 Pain location: right medial ankle pain Pain description: sharp and pressure Aggravating factors: walking Relieving factors: elevating his foot  PRECAUTIONS: None  RED FLAGS: None   WEIGHT BEARING RESTRICTIONS: No  FALLS:  Has patient fallen in last 6 months? No  LIVING ENVIRONMENT: Lives with: lives alone Lives in: House/apartment Stairs: Yes: External: 3 steps; can reach both; reciprocal pattern ascending and step to pattern descending Has following equipment at home: None  OCCUPATION: planning to return to work on 07/09/23; job requires primarily walking on concrete floor, but is able to stop and sit if needed  PLOF: Independent  PATIENT GOALS: play golf and baseball (umpire), and be able to walk normally  NEXT MD VISIT: 07/23/23  OBJECTIVE: all  objective measures were assessed at her initial evaluation on 07/05/23 unless otherwise noted  PATIENT SURVEYS:  FOTO 98.83 on 08/14/23  COGNITION: Overall cognitive status: Within functional limits for tasks assessed     SENSATION: Patient reports no numbness or tingling  EDEMA:  Circumferential: L: 55.7 cm R: 60 cm   PALPATION: TTP: right plantar fascia   JOINT MOBILITY:  Right talocrural: hypomobile and painful  Right subtalar: hypomobile and nonpainful   Right  midfoot: hypomobile and painful   LOWER EXTREMITY ROM:  Active ROM Right eval Right 08/14/23 Left eval  Hip flexion     Hip extension     Hip abduction     Hip adduction     Hip internal rotation     Hip external rotation     Knee flexion     Knee extension     Ankle dorsiflexion -7 2 5   Ankle plantarflexion 35 35 38  Ankle inversion 18 42 38  Ankle eversion 20 22 30    (Blank rows = not tested)  LOWER EXTREMITY MMT: not tested due to surgical condition  LOWER EXTREMITY SPECIAL TESTS:  Not tested due to surgical condition  GAIT: Assistive device utilized: None Level of assistance: Complete Independence Comments: Decreased stride length with decreased stance time on the right lower extremity, poor right foot toe off, in right foot flat at initial contact   TODAY'S TREATMENT:                                                                                                                              DATE:                                     08/14/23 EXERCISE LOG Goals were assessed and patient requested to be placed on hold with his current HEP  PATIENT EDUCATION:  Education details: healing, HEP, ice and compression socks, and progress with therapy Person educated: Patient Education method: Explanation Education comprehension: verbalized understanding  HOME EXERCISE PROGRAM:   ASSESSMENT:  CLINICAL IMPRESSION: Patient has made excellent progress with skilled physical therapy as evidenced by his subjective reports, objective measures, functional mobility, and progress toward his goals. He was able to meet most of his goals for skilled physical therapy, and he was able to walk over six miles on uneven terrain without any difficulty. He was also able to demonstrate a significant improvement in right ankle dorsiflexion and inversion since his initial evaluation. However, he was unable to meet his dorsiflexion goal of five degrees. He reported feeling comfortable with his  current HEP and requested to placed on hold to assess his ability to return to gym and participate in an upcoming golf tournament.   OBJECTIVE IMPAIRMENTS: Abnormal gait, decreased activity tolerance, decreased balance, decreased mobility, difficulty walking, decreased ROM, decreased strength, hypomobility, increased edema, and pain.   ACTIVITY LIMITATIONS: carrying, lifting,  standing, squatting, stairs, and locomotion level  PARTICIPATION LIMITATIONS: meal prep, cleaning, laundry, driving, shopping, community activity, occupation, and yard work  PERSONAL FACTORS: Transportation and 1-2 comorbidities: Hypertension and heart failure  are also affecting patient's functional outcome.   REHAB POTENTIAL: Good  CLINICAL DECISION MAKING: Stable/uncomplicated  EVALUATION COMPLEXITY: Low   GOALS: Goals reviewed with patient? Yes  SHORT TERM GOALS: Target date: 07/26/23 Patient will be independent with his initial HEP. Baseline: Goal status: MET  2.  Patient will be able to complete his daily activities without his familiar pain exceeding 3/10. Baseline:  Goal status: MET  3.  Patient will be able to demonstrate right ankle dorsiflexion to at least neutral for improved gait mechanics. Baseline:  Goal status: MET  LONG TERM GOALS: Target date: 08/16/23  Patient will be independent with his advanced HEP. Baseline:  Goal status: MET  2.  Patient will be able to ambulate with no significant gait deviations. Baseline:  Goal status: MET  3.  Patient will be able to demonstrate at least 5 degrees of right ankle dorsiflexion for improved function squatting. Baseline: 10/10: 2 degrees Goal status: NEARLY MET  4.  Patient will be able to descend stairs with a reciprocal pattern for improved household mobility. Baseline:  Goal status: MET  PLAN:  PT FREQUENCY: 2x/week  PT DURATION: 4 weeks  PLANNED INTERVENTIONS: Therapeutic exercises, Therapeutic activity, Neuromuscular  re-education, Balance training, Gait training, Patient/Family education, Self Care, Joint mobilization, Stair training, Electrical stimulation, Cryotherapy, Moist heat, scar mobilization, Taping, Vasopneumatic device, Manual therapy, and Re-evaluation  PLAN FOR NEXT SESSION: potential discharge    Granville Lewis, PT 08/14/2023, 8:21 AM

## 2023-08-21 ENCOUNTER — Ambulatory Visit: Payer: Commercial Managed Care - PPO | Admitting: Orthopedic Surgery

## 2023-08-22 ENCOUNTER — Encounter: Payer: Self-pay | Admitting: Pulmonary Disease

## 2023-08-22 ENCOUNTER — Other Ambulatory Visit: Payer: Self-pay | Admitting: Cardiology

## 2023-08-24 ENCOUNTER — Other Ambulatory Visit: Payer: Self-pay

## 2023-08-24 ENCOUNTER — Telehealth: Payer: Self-pay | Admitting: Cardiology

## 2023-08-24 MED ORDER — HYDRALAZINE HCL 50 MG PO TABS: 50.0000 mg | ORAL_TABLET | Freq: Three times a day (TID) | ORAL | 0 refills | Status: DC

## 2023-08-24 NOTE — Telephone Encounter (Signed)
*  STAT* If patient is at the pharmacy, call can be transferred to refill team.   1. Which medications need to be refilled? (please list name of each medication and dose if known)   hydrALAZINE (APRESOLINE) 50 MG tablet   2. Would you like to learn more about the convenience, safety, & potential cost savings by using the [facility]?   3. Are you open to using the [facility] (Type [facility]. ).  4. Which pharmacy/location (including street and city if local pharmacy) is medication to be sent to?  [facility] [address], [place] - [address]   5. Do they need a 30 day or 90 day supply?   90 day  Patient stated he is completely out of this medication.  Patient has appointment scheduled on 01/16/24.

## 2023-09-22 ENCOUNTER — Other Ambulatory Visit: Payer: Self-pay | Admitting: Cardiology

## 2023-09-24 ENCOUNTER — Other Ambulatory Visit: Payer: Self-pay | Admitting: Cardiology

## 2023-09-24 MED ORDER — HYDRALAZINE HCL 50 MG PO TABS: 50.0000 mg | ORAL_TABLET | Freq: Three times a day (TID) | ORAL | 0 refills | Status: DC

## 2023-09-25 ENCOUNTER — Other Ambulatory Visit: Payer: Self-pay | Admitting: Cardiology

## 2023-09-25 NOTE — Telephone Encounter (Signed)
Prescription  was completed on 09/24/23

## 2023-11-28 ENCOUNTER — Other Ambulatory Visit: Payer: Self-pay

## 2023-11-28 ENCOUNTER — Emergency Department (HOSPITAL_BASED_OUTPATIENT_CLINIC_OR_DEPARTMENT_OTHER): Payer: Commercial Managed Care - PPO | Admitting: Radiology

## 2023-11-28 ENCOUNTER — Emergency Department (HOSPITAL_BASED_OUTPATIENT_CLINIC_OR_DEPARTMENT_OTHER)
Admission: EM | Admit: 2023-11-28 | Discharge: 2023-11-28 | Disposition: A | Discharge status: Home / Self Care | Payer: Commercial Managed Care - PPO | Attending: Emergency Medicine | Admitting: Emergency Medicine

## 2023-11-28 ENCOUNTER — Encounter (HOSPITAL_BASED_OUTPATIENT_CLINIC_OR_DEPARTMENT_OTHER): Payer: Self-pay | Admitting: Emergency Medicine

## 2023-11-28 DIAGNOSIS — R519 Headache, unspecified: Secondary | ICD-10-CM | POA: Diagnosis present

## 2023-11-28 DIAGNOSIS — R0600 Dyspnea, unspecified: Secondary | ICD-10-CM | POA: Diagnosis not present

## 2023-11-28 DIAGNOSIS — I10 Essential (primary) hypertension: Secondary | ICD-10-CM | POA: Diagnosis not present

## 2023-11-28 DIAGNOSIS — Z79899 Other long term (current) drug therapy: Secondary | ICD-10-CM | POA: Insufficient documentation

## 2023-11-28 LAB — CBC WITH DIFFERENTIAL/PLATELET
Abs Immature Granulocytes: 0.03 10*3/uL (ref 0.00–0.07)
Basophils Absolute: 0.1 10*3/uL (ref 0.0–0.1)
Basophils Relative: 1 %
Eosinophils Absolute: 0.1 10*3/uL (ref 0.0–0.5)
Eosinophils Relative: 1 %
HCT: 52.5 % — ABNORMAL HIGH (ref 39.0–52.0)
Hemoglobin: 18 g/dL — ABNORMAL HIGH (ref 13.0–17.0)
Immature Granulocytes: 0 %
Lymphocytes Relative: 40 %
Lymphs Abs: 3.1 10*3/uL (ref 0.7–4.0)
MCH: 30.7 pg (ref 26.0–34.0)
MCHC: 34.3 g/dL (ref 30.0–36.0)
MCV: 89.4 fL (ref 80.0–100.0)
Monocytes Absolute: 0.8 10*3/uL (ref 0.1–1.0)
Monocytes Relative: 10 %
Neutro Abs: 3.8 10*3/uL (ref 1.7–7.7)
Neutrophils Relative %: 48 %
Platelets: 205 10*3/uL (ref 150–400)
RBC: 5.87 MIL/uL — ABNORMAL HIGH (ref 4.22–5.81)
RDW: 14 % (ref 11.5–15.5)
WBC: 7.8 10*3/uL (ref 4.0–10.5)
nRBC: 0 % (ref 0.0–0.2)

## 2023-11-28 LAB — BASIC METABOLIC PANEL
Anion gap: 11 (ref 5–15)
BUN: 14 mg/dL (ref 6–20)
CO2: 28 mmol/L (ref 22–32)
Calcium: 10.4 mg/dL — ABNORMAL HIGH (ref 8.9–10.3)
Chloride: 99 mmol/L (ref 98–111)
Creatinine, Ser: 1.15 mg/dL (ref 0.61–1.24)
GFR, Estimated: >60 mL/min (ref >60)
Glucose, Bld: 99 mg/dL (ref 70–99)
Potassium: 3.8 mmol/L (ref 3.5–5.1)
Sodium: 138 mmol/L (ref 135–145)

## 2023-11-28 LAB — TROPONIN I (HIGH SENSITIVITY)
Troponin I (High Sensitivity): 12 ng/L (ref <18)
Troponin I (High Sensitivity): 15 ng/L (ref <18)

## 2023-11-28 LAB — MAGNESIUM: Magnesium: 1.8 mg/dL (ref 1.7–2.4)

## 2023-11-28 LAB — BRAIN NATRIURETIC PEPTIDE: B Natriuretic Peptide: 30.6 pg/mL (ref 0.0–100.0)

## 2023-11-28 MED ORDER — SODIUM CHLORIDE 0.9 % IV BOLUS
1000.0000 mL | Freq: Once | INTRAVENOUS | Status: AC
  Administered 2023-11-28: 1000 mL via INTRAVENOUS

## 2023-11-28 MED ORDER — CLONIDINE HCL 0.2 MG PO TABS
0.2000 mg | ORAL_TABLET | Freq: Two times a day (BID) | ORAL | 0 refills | Status: DC | PRN
Start: 2023-11-28 — End: 2024-01-07

## 2023-11-28 MED ORDER — HYDRALAZINE HCL 25 MG PO TABS
50.0000 mg | ORAL_TABLET | Freq: Once | ORAL | Status: AC
  Administered 2023-11-28: 50 mg via ORAL
  Filled 2023-11-28: qty 2

## 2023-11-28 MED ORDER — CLONIDINE HCL 0.1 MG PO TABS
0.2000 mg | ORAL_TABLET | Freq: Once | ORAL | Status: AC
  Administered 2023-11-28: 0.2 mg via ORAL
  Filled 2023-11-28: qty 2

## 2023-11-28 NOTE — ED Notes (Signed)
 RN reviewed discharge instructions with pt. Pt verbalized understanding and had no further questions. VSS upon discharge.

## 2023-11-28 NOTE — ED Provider Notes (Signed)
 [place] EMERGENCY DEPARTMENT AT [facility] Provider Note   CSN: [id number] Arrival date & time: 11/28/23  1404     History  Chief Complaint  Patient presents with   Hypertension    Ricardo Evans is a 43 y.o. male with a history of HTN, on Coreg  twice daily and hydralazine  50 mg 3 times daily, presented to the ED with complaint of headache and dizziness earlier today.  Patient reports that he was diagnosed with influenza symptomatically with a telehealth visit last Sunday, reports was having fevers, chills, body aches and coughing at the time.  Subsequently few days later he was prescribed doxycycline for possible pneumonia, reporting had a persistent cough.  He has 1 or 2 days left of doxycycline.  He says he was at work today began to develop a headache, and felt with it some vertigo as well.  He felt nauseated mildly.  He said his blood pressure was quite high at the time.  He has been compliant with his blood pressure medications as on these 2 medicines his blood pressure is typically 140 systolic.  He did take his afternoon 50 mg hydralazine  when his headache had begun, and reports that the headache is completely resolved and he no longer feels dizzy.  He denies any chest pain or pressure.    He does report he has been under significant mental stress and anxiety recently.  He denies that he is on any additional blood pressure medications, stating that typically his blood pressure is well-controlled on these current 2 medications.  HPI     Home Medications Prior to Admission medications   Medication Sig Start Date End Date Taking? Authorizing Provider  cloNIDine  (CATAPRES ) 0.2 MG tablet Take 1 tablet (0.2 mg total) by mouth 2 (two) times daily as needed. For systolic blood pressure over 200 mmhg (top number) or diastolic pressure over 120 mmhg (bottom number) on repeat checks 11/28/23 12/28/23 Yes Laqueisha Catalina, Donnice PARAS, MD  carvedilol  (COREG ) 12.5 MG tablet Take 1 tablet (12.5  mg total) by mouth 2 (two) times daily with a meal. 09/06/22   Lavona Lynwood, MD  hydrALAZINE  (APRESOLINE ) 50 MG tablet Take 1 tablet (50 mg total) by mouth 3 (three) times daily. Keep appt in March  2025 to continue refills. 09/24/23   Lavona Lynwood, MD  ibuprofen  (ADVIL ) 400 MG tablet Take 1 tablet (400 mg total) by mouth every 8 (eight) hours as needed. Patient not taking: Reported on 06/18/2023 04/15/23   Freddi Hamilton, MD  Multiple Vitamins-Minerals (MULTIVITAMIN WITH MINERALS) tablet Take 1 tablet by mouth daily. [employer]    [provider]  Olmesartan -amLODIPine -HCTZ 40-10-25 MG TABS Take 1 tablet by mouth once daily Patient not taking: Reported on 06/18/2023 07/27/22   Lavona Lynwood, MD  oxyCODONE -acetaminophen  (PERCOCET/ROXICET) 5-325 MG tablet Take 1 tablet by mouth every 4 (four) hours as needed. 06/27/23   Harden Jerona GAILS, MD  predniSONE  (STERAPRED UNI-PAK 21 TAB) 5 MG (21) TBPK tablet Take dosepak as directed Patient not taking: Reported on 06/18/2023 04/16/23   Addie Cordella Hamilton, MD      Allergies    Penicillins, Cephalexin, Sulfa antibiotics, and Sulfonamide derivatives    Review of Systems   Review of Systems  Physical Exam Updated Vital Signs BP (!) 169/101 (BP Location: Right Arm)   Pulse 83   Temp 98.3 F (36.8 C) (Oral)   Resp 20   SpO2 97%  Physical Exam Constitutional:      General: He is not in  acute distress. HENT:     Head: Normocephalic and atraumatic.  Eyes:     Conjunctiva/sclera: Conjunctivae normal.     Pupils: Pupils are equal, round, and reactive to light.  Cardiovascular:     Rate and Rhythm: Normal rate and regular rhythm.  Pulmonary:     Effort: Pulmonary effort is normal. No respiratory distress.  Abdominal:     General: There is no distension.     Tenderness: There is no abdominal tenderness.  Skin:    General: Skin is warm and dry.  Neurological:     General: No focal deficit present.     Mental Status: He is alert and  oriented to person, place, and time. Mental status is at baseline.     Sensory: No sensory deficit.     Motor: No weakness.  Psychiatric:        Mood and Affect: Mood normal.        Behavior: Behavior normal.     ED Results / Procedures / Treatments   Labs (all labs ordered are listed, but only abnormal results are displayed) Labs Reviewed  BASIC METABOLIC PANEL - Abnormal; Notable for the following components:      Result Value   Calcium 10.4 (*)    All other components within normal limits  CBC WITH DIFFERENTIAL/PLATELET - Abnormal; Notable for the following components:   RBC 5.87 (*)    Hemoglobin 18.0 (*)    HCT 52.5 (*)    All other components within normal limits  BRAIN NATRIURETIC PEPTIDE  MAGNESIUM  TROPONIN I (HIGH SENSITIVITY)  TROPONIN I (HIGH SENSITIVITY)    EKG EKG Interpretation Date/Time:  Wednesday November 28 2023 14:29:57 EST Ventricular Rate:  98 PR Interval:  160 QRS Duration:  94 QT Interval:  372 QTC Calculation: 474 R Axis:   54  Text Interpretation: Normal sinus rhythm Normal ECG When compared with ECG of 27-Jun-2023 07:50, Nonspecific T wave abnormality, improved in Lateral leads Confirmed by Tonia Chew [phone]) on 11/28/2023 2:40:35 PM  Radiology DG Chest 2 View Result Date: 11/28/2023 CLINICAL DATA:  Dyspnea. EXAM: CHEST - 2 VIEW COMPARISON:  06/20/2022. FINDINGS: Bilateral lung fields are clear. Bilateral costophrenic angles are clear. Normal cardio-mediastinal silhouette. No acute osseous abnormalities. The soft tissues are within normal limits. IMPRESSION: No active cardiopulmonary disease. Electronically Signed   By: Ree Molt M.D.   On: 11/28/2023 16:00    Procedures Procedures    Medications Ordered in ED Medications  cloNIDine  (CATAPRES ) tablet 0.2 mg (0.2 mg Oral Given 11/28/23 1540)  sodium chloride  0.9 % bolus 1,000 mL (0 mLs Intravenous Stopped 11/28/23 1726)  hydrALAZINE  (APRESOLINE ) tablet 50 mg (50 mg Oral Given 11/28/23  1736)    ED Course/ Medical Decision Making/ A&P Clinical Course as of 11/29/23 0032  Wed Nov 28, 2023  1539 Bp elevated significantly, but pt is asymptomatic - clonidine  ordered [MT]  2035 Patient reassessed reports he is completely asymptomatic.  He was observed for 6 hours now in the ED.  No headache or neurological symptoms, or chest pain.  His blood pressure does remain elevated discussed this with the patient and his wife at bedside.  I will prescribe him clonidine  as a rescue medication and they will be carefully monitoring his blood pressure for the next several days and taking this medication as needed.  He will follow-up with her PCP for this issue.  But at this point I do not see an indication for emergent hospitalization. [MT]  Clinical Course User Index [MT] Cottie Donnice PARAS, MD                                 Medical Decision Making Risk Prescription drug management.   This patient presents to the ED with concern for high blood pressure, dizziness, headache. This involves an extensive number of treatment options, and is a complaint that carries with it a high risk of complications and morbidity.  The differential diagnosis includes hypertensive urgency or emergency versus CVA versus viral syndrome versus other  Co-morbidities that complicate the patient evaluation: History of significant persistent underlying hypertension  I ordered and personally interpreted labs.  The pertinent results include: No emergent findings.  Troponin is negative.  Patient has not had any chest pain complaints and I believe he is needing delta troponins.  Creatinine within normal limits.  I ordered imaging studies including x-ray of the chest I independently visualized and interpreted imaging which showed no emergent findings I agree with the radiologist interpretation  The patient was maintained on a cardiac monitor.  I personally viewed and interpreted the cardiac monitored which showed an  underlying rhythm of: Sinus rhythm  Per my interpretation the patient's ECG shows no acute ischemic findings. Very minor ST depression in inferior leads noted on prior tracing August 2021  I ordered medication including IV fluid for hydration, oral clonidine  for hypertenssion  I have reviewed the patients home medicines and have made adjustments as needed  Test Considered: Patient is having no headache, no persistent vertigo symptoms to suggest PR ES or posterior stroke or subarachnoid hemorrhage.  At this point there is no indication for lumbar puncture or neuroimaging.  No fever or recent meningismal symptoms.  After the interventions noted above, I reevaluated the patient and found that they have: improved  Social Determinants of Health: patient counseled about importance of low-salt diet  Dispostion:  After consideration of the diagnostic results and the patients response to treatment, I feel that the patent would benefit from close outpatient follow-up         Final Clinical Impression(s) / ED Diagnoses Final diagnoses:  Hypertension, unspecified type    Rx / DC Orders ED Discharge Orders          Ordered    cloNIDine  (CATAPRES ) 0.2 MG tablet  2 times daily PRN        11/28/23 2036              Cottie Donnice PARAS, MD 11/29/23 [phone]

## 2023-11-28 NOTE — ED Provider Triage Note (Signed)
 Emergency Medicine Provider Triage Evaluation Note  Ricardo Evans , a 43 y.o. male  was evaluated in triage.  Pt complains of elevated blood pressure.  He states he has taken his home medications without improvement.  Does endorse shortness of breath that started while he was at work today.  This is not usual for him.  He states typically his blood pressure is controlled however he does not routinely check it.  Denies chest pain, vision change, balance issues..  Review of Systems  Positive: As above Negative: As above  Physical Exam  BP (!) 231/134 (BP Location: Right Arm)   Pulse (!) 102   Temp 99.1 F (37.3 C)   Resp 19   SpO2 94%  Gen:   Awake, no distress   Resp:  Normal effort MSK:   Moves extremities without difficulty  Other:    Medical Decision Making  Medically screening exam initiated at 2:38 PM.  Appropriate orders placed.  RAYWOOD WAILES was informed that the remainder of the evaluation will be completed by another provider, this initial triage assessment does not replace that evaluation, and the importance of remaining in the ED until their evaluation is complete.     Hildegard Loge, PA-C 11/28/23 1439

## 2023-11-28 NOTE — Discharge Instructions (Addendum)
 Please monitor your blood pressure checking twice a day for the next several days and keeping a daily diary.  I prescribed clonidine  as a rescue medication for high blood pressure.  You will take this if your systolic blood pressure top number is over 200 mmhg or your bottom number diastolic pressure is over 120 mmhg. Always remember to first recheck your blood pressure after waiting calmly with both feet resting on the ground for at least 5 minutes. Do not exceed more than 2 doses of clonidine  in a day, ideally with 8-12 hours between dosing.  Please call your doctor's office to schedule a follow-up appointment in the office.  Your home blood pressure medications may need to be adjusted moving forward.  If you experience dizziness, lightheadedness, numbness or weakness, chest pain or pressure, difficulty breathing, or loss of consciousness, please call 911 or return immediately to the ER.

## 2023-11-28 NOTE — ED Triage Notes (Signed)
 Recent flu and PNA  Dizziness headache earlier today  Took normal BP medications  Denies chest pain  Headache resolved after taking BP meds

## 2023-12-11 NOTE — Progress Notes (Deleted)
  Cardiology Office Note:  .   Date:  12/11/2023  ID:  Ricardo Evans, DOB May 11, 1981, MRN [id number] PCP: [facility]  [facility] Providers Cardiologist:  Rollene Rotunda, MD {   History of Present Illness: .   Ricardo Evans is a 43 y.o. male has history of heart failure with recovered EF, hypertension, dilated aortic root, CKD OSA.  Patient had echocardiogram in August 2021 where he was evaluated for shortness of breath and hypertensive urgency.  Echocardiogram had shown mildly reduced EF 45 to 50% with severe LVH.  We had not seen him during that hospitalization.  He had an MRA showing no evidence of renal artery stenosis.  Widely patent renal arteries.  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  Follow-up CT measured aortic root 39 mm in March 2022.  Had his last follow-up in June 2023 he had reported 40 pound weight gain over the last 3 years but no new symptoms.  Echocardiogram also ordered that indicated improved EF now 60 to 65% with mild LVH, normal RV function.  He has now been seen on 3 different occasions for poorly controlled hypertension in the last month.  Systolics reported to be in the 200s.  Carvedilol was increased to 25 mg in the morning and 12 point 5 in the evening  Uncontrolled hypertension Seen by the ED/urgent care 3 times in the last month for this issue with systolics in the 200s.  Discussed ongoing management including need for daily measurements at least 2-3 times a day.  Also discussed that in the absence of symptoms hypertensive urgency does not require emergency workup but rather better basal hypertensive regiment.  Previous workup negative for renal artery stenosis.  ROS: ***  Studies Reviewed: .        *** Risk Assessment/Calculations:   {Does this patient have ATRIAL FIBRILLATION?:[phone]} No BP recorded.  {Refresh Note OR Click here to enter BP  :1}***       Physical Exam:   VS:  There were no  vitals taken for this visit.   Wt Readings from Last 3 Encounters:  06/27/23 228 lb (103.4 kg)  04/15/23 228 lb (103.4 kg)  04/05/23 256 lb 13.4 oz (116.5 kg)    GEN: Well nourished, well developed in no acute distress NECK: No JVD; No carotid bruits CARDIAC: ***RRR, no murmurs, rubs, gallops RESPIRATORY:  Clear to auscultation without rales, wheezing or rhonchi  ABDOMEN: Soft, non-tender, non-distended EXTREMITIES:  No edema; No deformity   ASSESSMENT AND PLAN: .   ***    {Are you ordering a CV Procedure (e.g. stress test, cath, DCCV, TEE, etc)?   Press F2        :[id number]}  Dispo: ***  Signed, Abagail Kitchens, PA-C

## 2023-12-12 ENCOUNTER — Ambulatory Visit: Payer: Commercial Managed Care - PPO | Admitting: Physician Assistant

## 2023-12-20 ENCOUNTER — Ambulatory Visit: Payer: Commercial Managed Care - PPO | Attending: Physician Assistant | Admitting: Physician Assistant

## 2023-12-20 VITALS — BP 146/98 | HR 99 | Ht 72.0 in | Wt 247.0 lb

## 2023-12-20 DIAGNOSIS — I10 Essential (primary) hypertension: Secondary | ICD-10-CM

## 2023-12-20 DIAGNOSIS — I429 Cardiomyopathy, unspecified: Secondary | ICD-10-CM | POA: Diagnosis not present

## 2023-12-20 DIAGNOSIS — I77810 Thoracic aortic ectasia: Secondary | ICD-10-CM

## 2023-12-20 DIAGNOSIS — I712 Thoracic aortic aneurysm, without rupture, unspecified: Secondary | ICD-10-CM

## 2023-12-20 DIAGNOSIS — I5021 Acute systolic (congestive) heart failure: Secondary | ICD-10-CM

## 2023-12-20 MED ORDER — IRBESARTAN 150 MG PO TABS: 150.0000 mg | ORAL_TABLET | Freq: Every day | ORAL | 3 refills | Status: DC

## 2023-12-20 MED ORDER — CARVEDILOL 25 MG PO TABS: 25.0000 mg | ORAL_TABLET | Freq: Two times a day (BID) | ORAL | 3 refills | Status: AC

## 2023-12-20 NOTE — Progress Notes (Unsigned)
 Cardiology Office Note:  .   Date:  12/21/2023  ID:  Ricardo Evans, DOB October 15, 1981, MRN [id number] PCP: [facility]  [facility] Providers Cardiologist:  Rollene Rotunda, MD     History of Present Illness: .   Ricardo Evans is a 43 y.o. male with PMH of chronic systolic and diastolic heart failure, hypertension, CKD, obesity and dilated aortic root.  He was admitted in the hospital in August 2021 after presenting with hypertensive urgency and shortness of breath.  Echocardiogram at the time showed EF 45 to 50% with severe LVH.  He was sent home on increased dose of Coreg and amlodipine.  There was no renal artery stenosis on CT image.  BNP was 265.  MRA of abdomen obtained on 06/02/2020 showed widely patent bilateral renal arteries without evidence for high-grade stenosis, mild cardiomegaly, widely patent SMA and IMA.  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  He was last seen by Dr. Antoine Poche in June 2023, an echocardiogram was ordered.  He was on carvedilol, hydralazine, olmesartan-amlodipine-HCTZ at the time.  Echocardiogram obtained on 04/20/2022 showed EF of 60 to 65%, grade 1 DD, no regional wall motion abnormality, mild LVH.  More recently, patient went to the ED twice.  He was in the emergency room on 11/28/2023 with headache and dizziness, systolic blood pressure was elevated 160s.  BNP 30.6.  Hemoglobin 18.0.  Serial troponin negative.  He was in [facility] ED on 12/04/2023 due to poorly controlled hypertension.  His systolic blood pressure reportedly was over 200 at the morning.  Patient presents today for follow-up.  He denies any chest pain or shortness of breath.  He has blood pressure remaining elevated at 146/98.  I recommended he increase his carvedilol to 25 mg twice a day.  He is on clonidine as needed, I prefer him not to take the clonidine due to side effect of rebound hypertension.  He  says his PCP recently started him on losartan 50 mg daily, I will switch the losartan to irbesartan 150 mg daily for better blood pressure control.  I plan to bring the patient back in 3 weeks for reassessment.  He is overdue for CT of the chest without contrast to look at the thoracic aortic aneurysm.  ROS:   He denies chest pain, palpitations, dyspnea, pnd, orthopnea, n, v, dizziness, syncope, edema, weight gain, or early satiety. All other systems reviewed and are otherwise negative except as noted above.    Studies Reviewed: Marland Kitchen   EKG Interpretation Date/Time:  Thursday December 20 2023 10:50:42 EST Ventricular Rate:  99 PR Interval:  152 QRS Duration:  92 QT Interval:  374 QTC Calculation: 479 R Axis:   -18  Text Interpretation: Normal sinus rhythm Normal ECG When compared with ECG of 28-Nov-2023 14:29, Questionable change in QRS axis Confirmed by Azalee Course [phone]) on 12/21/2023 7:32:43 PM    Cardiac Studies & Procedures   ______________________________________________________________________________________________     ECHOCARDIOGRAM  ECHOCARDIOGRAM COMPLETE 04/28/2022  Narrative ECHOCARDIOGRAM REPORT    Patient Name:   Ricardo Evans Date of Exam: 04/28/2022 Medical Rec #:  [id number]      Height:       72.0 in Accession #:    [id number]     Weight:       258.3 lb Date of Birth:  1981-02-14      BSA:  2.375 m Patient Age:    40 years       BP:           132/84 mmHg Patient Gender: M              HR:           83 bpm. Exam Location:  [place]  Procedure: 2D Echo, Cardiac Doppler, Color Doppler and Strain Analysis  Indications:    I50.22 Chronic systolic (congestive) heart failure  History:        Patient has prior history of Echocardiogram examinations, most recent 05/31/2020. CHF and Cardiomyopathy, CKD, morbidly obese; Risk Factors:Hypertension.  Sonographer:    Jake Seats RDMS, RVT, RDCS Referring Phys: [id number]   Sonographer Comments: Global  longitudinal strain was attempted. IMPRESSIONS   1. Left ventricular ejection fraction, by estimation, is 60 to 65%. The left ventricle has normal function. The left ventricle has no regional wall motion abnormalities. There is mild left ventricular hypertrophy. Left ventricular diastolic parameters are consistent with Grade I diastolic dysfunction (impaired relaxation). 2. Right ventricular systolic function is normal. The right ventricular size is normal. 3. The mitral valve is normal in structure. Trivial mitral valve regurgitation. No evidence of mitral stenosis. 4. The aortic valve is normal in structure. Aortic valve regurgitation is not visualized. No aortic stenosis is present. 5. The inferior vena cava is normal in size with greater than 50% respiratory variability, suggesting right atrial pressure of 3 mmHg.  Comparison(s): Prior images reviewed side by side. The left ventricular function has improved. Echopcardiogram done 05/31/20 showed an Ef of 45-50%.  FINDINGS Left Ventricle: Left ventricular ejection fraction, by estimation, is 60 to 65%. The left ventricle has normal function. The left ventricle has no regional wall motion abnormalities. Global longitudinal strain performed but not reported based on interpreter judgement due to suboptimal tracking. The left ventricular internal cavity size was normal in size. There is mild left ventricular hypertrophy. Left ventricular diastolic parameters are consistent with Grade I diastolic dysfunction (impaired relaxation).  Right Ventricle: The right ventricular size is normal. No increase in right ventricular wall thickness. Right ventricular systolic function is normal.  Left Atrium: Left atrial size was normal in size.  Right Atrium: Right atrial size was normal in size.  Pericardium: There is no evidence of pericardial effusion.  Mitral Valve: The mitral valve is normal in structure. Trivial mitral valve regurgitation. No evidence of  mitral valve stenosis.  Tricuspid Valve: The tricuspid valve is normal in structure. Tricuspid valve regurgitation is not demonstrated. No evidence of tricuspid stenosis.  Aortic Valve: The aortic valve is normal in structure. Aortic valve regurgitation is not visualized. No aortic stenosis is present.  Pulmonic Valve: The pulmonic valve was normal in structure. Pulmonic valve regurgitation is not visualized. No evidence of pulmonic stenosis.  Aorta: The aortic root is normal in size and structure.  Venous: The inferior vena cava is normal in size with greater than 50% respiratory variability, suggesting right atrial pressure of 3 mmHg.  IAS/Shunts: No atrial level shunt detected by color flow Doppler.   LEFT VENTRICLE PLAX 2D LVIDd:         4.52 cm      Diastology LVIDs:         2.30 cm      LV e' medial:    6.53 cm/s LV PW:         1.37 cm      LV E/e' medial:  12.3 LV IVS:  1.16 cm      LV e' lateral:   8.49 cm/s LVOT diam:     2.20 cm      LV E/e' lateral: 9.4 LV SV:         77 LV SV Index:   32 LVOT Area:     3.80 cm  LV Volumes (MOD) LV vol d, MOD A2C: 115.0 ml LV vol d, MOD A4C: 87.1 ml LV vol s, MOD A2C: 32.1 ml LV vol s, MOD A4C: 24.2 ml LV SV MOD A2C:     82.9 ml LV SV MOD A4C:     87.1 ml LV SV MOD BP:      71.9 ml  RIGHT VENTRICLE RV S prime:     13.10 cm/s TAPSE (M-mode): 3.5 cm  LEFT ATRIUM             Index        RIGHT ATRIUM           Index LA diam:        3.90 cm 1.64 cm/m   RA Area:     12.10 cm LA Vol (A2C):   64.2 ml 27.03 ml/m  RA Volume:   26.70 ml  11.24 ml/m LA Vol (A4C):   67.9 ml 28.59 ml/m LA Biplane Vol: 70.8 ml 29.81 ml/m AORTIC VALVE LVOT Vmax:   112.00 cm/s LVOT Vmean:  76.000 cm/s LVOT VTI:    0.203 m  AORTA Ao Root diam: 3.70 cm Ao Asc diam:  3.60 cm  MITRAL VALVE MV Area (PHT): 3.76 cm    SHUNTS MV Decel Time: 202 msec    Systemic VTI:  0.20 m MR Peak grad: 12.1 mmHg    Systemic Diam: 2.20 cm MR Vmax:       174.00 cm/s MV E velocity: 80.05 cm/s MV A velocity: 73.50 cm/s MV E/A ratio:  1.09  Donato Schultz MD Electronically signed by Donato Schultz MD Signature Date/Time: 04/28/2022/4:08:31 PM    Final          ______________________________________________________________________________________________      Risk Assessment/Calculations:            Physical Exam:   VS:  BP (!) 146/98 (BP Location: Right Arm, Patient Position: Sitting, Cuff Size: Normal)   Pulse 99   Ht 6' (1.829 m)   Wt 247 lb (112 kg)   BMI 33.50 kg/m    Wt Readings from Last 3 Encounters:  12/20/23 247 lb (112 kg)  06/27/23 228 lb (103.4 kg)  04/15/23 228 lb (103.4 kg)    GEN: Well nourished, well developed in no acute distress NECK: No JVD; No carotid bruits CARDIAC: RRR, no murmurs, rubs, gallops RESPIRATORY:  Clear to auscultation without rales, wheezing or rhonchi  ABDOMEN: Soft, non-tender, non-distended EXTREMITIES:  No edema; No deformity   ASSESSMENT AND PLAN: .    Chronic Systolic and Diastolic Heart Failure Previous echocardiogram in 2021 showed EF 45-50% with severe LVH. Recent echocardiogram in 2023 showed EF 60-65% and mild LVH, suggesting improved control of blood pressure and heart failure. -Continue current heart failure management.  Dilated Aortic Root Previous CT in 2021 showed dilated aortic root measuring 4.2cm. Concern for further dilation due to uncontrolled hypertension. -Order CT chest without contrast to assess current size of aortic root.  Hypertension Uncontrolled, with recent episodes of hypertensive urgency. Patient has been inconsistently taking Clonidine as needed, which is not recommended due to risk of rebound hypertension. Currently on Carvedilol, Hydralazine, and Losartan. -Increase  Carvedilol to 25mg  twice daily. -Switch Losartan to Irbesartan 150mg  daily. -Plan to wean off Hydralazine if blood pressure improves with new regimen. -avoid using clonidine as needed  given possibility of rebound hypertension -Return visit in 3 weeks for blood pressure medication titration.        Dispo: Reassess blood pressure in 3 weeks  Signed, Azalee Course, [place]

## 2023-12-20 NOTE — Patient Instructions (Signed)
 Medication Instructions:  INCREASE CARVEDILOL TO 25 MG TWICE DAILY   STOP LOSARTAN  START IRBESARTAN (2/28) 150 MG DAILY *If you need a refill on your cardiac medications before your next appointment, please call your pharmacy*   Lab Work: NO LABS If you have labs (blood work) drawn today and your tests are completely normal, you will receive your results only by: MyChart Message (if you have MyChart) OR A paper copy in the mail If you have any lab test that is abnormal or we need to change your treatment, we will call you to review the results.   Testing/Procedures: IMAGING [address] Non-Cardiac CT scanning, (CAT scanning), is a noninvasive, special x-ray that produces cross-sectional images of the body using x-rays and a computer. CT scans help physicians diagnose and treat medical conditions. For some CT exams, a contrast material is used to enhance visibility in the area of the body being studied. CT scans provide greater clarity and reveal more details than regular x-ray exams.    Follow-Up: At [facility], you and your health needs are our priority.  As part of our continuing mission to provide you with exceptional heart care, we have created designated Provider Care Teams.  These Care Teams include your primary Cardiologist (physician) and Advanced Practice Providers (APPs -  Physician Assistants and Nurse Practitioners) who all work together to provide you with the care you need, when you need it.  Your next appointment:   3 week(s)  Provider:   Azalee Course, PA

## 2023-12-27 ENCOUNTER — Ambulatory Visit
Admission: RE | Admit: 2023-12-27 | Discharge: 2023-12-27 | Disposition: A | Discharge status: Home / Self Care | Payer: Commercial Managed Care - PPO | Source: Ambulatory Visit | Attending: Physician Assistant | Admitting: Physician Assistant

## 2024-01-07 ENCOUNTER — Encounter: Payer: Self-pay | Admitting: Physician Assistant

## 2024-01-07 ENCOUNTER — Ambulatory Visit: Payer: Commercial Managed Care - PPO | Attending: Physician Assistant | Admitting: Physician Assistant

## 2024-01-07 VITALS — BP 134/94 | HR 92 | Ht 72.0 in | Wt 252.0 lb

## 2024-01-07 DIAGNOSIS — I77810 Thoracic aortic ectasia: Secondary | ICD-10-CM

## 2024-01-07 DIAGNOSIS — I5042 Chronic combined systolic (congestive) and diastolic (congestive) heart failure: Secondary | ICD-10-CM

## 2024-01-07 DIAGNOSIS — I10 Essential (primary) hypertension: Secondary | ICD-10-CM

## 2024-01-07 DIAGNOSIS — Z79899 Other long term (current) drug therapy: Secondary | ICD-10-CM

## 2024-01-07 MED ORDER — HYDRALAZINE HCL 50 MG PO TABS: 50.0000 mg | ORAL_TABLET | Freq: Three times a day (TID) | ORAL | 2 refills | Status: AC

## 2024-01-07 MED ORDER — IRBESARTAN 300 MG PO TABS: 300.0000 mg | ORAL_TABLET | Freq: Every day | ORAL | 3 refills | Status: AC

## 2024-01-07 NOTE — Patient Instructions (Signed)
 Medication Instructions:  INCREASE IRBESARTAN TO 300 MG DAILY *If you need a refill on your cardiac medications before your next appointment, please call your pharmacy*   Lab Work: BMP IN 2-3 WEEKS If you have labs (blood work) drawn today and your tests are completely normal, you will receive your results only by: MyChart Message (if you have MyChart) OR A paper copy in the mail If you have any lab test that is abnormal or we need to change your treatment, we will call you to review the results.   Testing/Procedures: NO TESTING   Follow-Up: At [facility], you and your health needs are our priority.  As part of our continuing mission to provide you with exceptional heart care, we have created designated Provider Care Teams.  These Care Teams include your primary Cardiologist (physician) and Advanced Practice Providers (APPs -  Physician Assistants and Nurse Practitioners) who all work together to provide you with the care you need, when you need it.  We recommend signing up for the patient portal called "MyChart".  Sign up information is provided on this After Visit Summary.  MyChart is used to connect with patients for Virtual Visits (Telemedicine).  Patients are able to view lab/test results, encounter notes, upcoming appointments, etc.  Non-urgent messages can be sent to your provider as well.   To learn more about what you can do with MyChart, go to [email or web address].    Your next appointment:   6-9 month(s)  Provider:   Rollene Rotunda, MD   Other Instructions

## 2024-01-07 NOTE — Progress Notes (Unsigned)
 Cardiology Office Note:  .   Date:  01/08/2024  ID:  Ricardo Evans, DOB [birth year], MRN [id number] PCP: [facility]  [facility] Providers Cardiologist:  Rollene Rotunda, MD     History of Present Illness: .   Ricardo Evans is a 43 y.o. male with PMH of chronic systolic and diastolic heart failure, hypertension, CKD, obesity and TAA.  He was admitted in the hospital in August 2021 after presenting with hypertensive urgency and shortness of breath.  Echocardiogram at the time showed EF 45 to 50% with severe LVH.  He was sent home on increased dose of Coreg and amlodipine.  There was no renal artery stenosis on CT image.  BNP was 265.  MRA of abdomen obtained on 06/02/2020 showed widely patent bilateral renal arteries without evidence for high-grade stenosis, mild cardiomegaly, widely patent SMA and IMA.  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  He was last seen by Dr. Antoine Poche in June 2023, an echocardiogram was ordered.  He was on carvedilol, hydralazine, olmesartan-amlodipine-HCTZ at the time.  Echocardiogram obtained on 04/20/2022 showed EF of 60 to 65%, grade 1 DD, no regional wall motion abnormality, mild LVH. He was in the emergency room on 11/28/2023 with headache and dizziness, systolic blood pressure was elevated 160s.  BNP 30.6.  Hemoglobin 18.0.  Serial troponin negative.  He was in [facility] ED on 12/04/2023 due to poorly controlled hypertension.  His systolic blood pressure reportedly was over 200 at the morning.  I last saw the patient on 12/20/2023, blood pressure remained elevated in the 140s.  He was prescribed clonidine as needed which I prefer him not to take due to side effect of rebound hypertension.  I increased his carvedilol to 25 mg twice a day and switched his losartan to irbesartan for better blood pressure control.   Patient presents today for follow-up.  His blood pressure is  much better controlled after increased dose of carvedilol and and addition of irbesartan.  Blood pressure is now in the 130s.  He feels better without any chest pain or shortness of breath.  I will increase irbesartan to 300 mg daily.  He will need a basic metabolic panel in 2 to 3 weeks.  This is not a fasting blood work.  I will refill his hydralazine.  Overall, patient's blood pressure is much better controlled.  He can follow-up with Dr. Antoine Poche in 6 to 9 months.   ROS:   He denies chest pain, palpitations, dyspnea, pnd, orthopnea, n, v, dizziness, syncope, edema, weight gain, or early satiety. All other systems reviewed and are otherwise negative except as noted above.    Studies Reviewed: .        Cardiac Studies & Procedures   ______________________________________________________________________________________________     ECHOCARDIOGRAM  ECHOCARDIOGRAM COMPLETE 04/28/2022  Narrative ECHOCARDIOGRAM REPORT    Patient Name:   Ricardo Evans Date of Exam: 04/28/2022 Medical Rec #:  [id number]      Height:       72.0 in Accession #:    [id number]     Weight:       258.3 lb Date of Birth:  Aug 15, 1981      BSA:          2.375 m Patient Age:    40 years       BP:           132/84 mmHg  Patient Gender: M              HR:           83 bpm. Exam Location:  [place]  Procedure: 2D Echo, Cardiac Doppler, Color Doppler and Strain Analysis  Indications:    I50.22 Chronic systolic (congestive) heart failure  History:        Patient has prior history of Echocardiogram examinations, most recent 05/31/2020. CHF and Cardiomyopathy, CKD, morbidly obese; Risk Factors:Hypertension.  Sonographer:    Jake Seats RDMS, RVT, RDCS Referring Phys: [id number] [facility]   Sonographer Comments: Global longitudinal strain was attempted. IMPRESSIONS   1. Left ventricular ejection fraction, by estimation, is 60 to 65%. The left ventricle has normal function. The left ventricle has no regional wall  motion abnormalities. There is mild left ventricular hypertrophy. Left ventricular diastolic parameters are consistent with Grade I diastolic dysfunction (impaired relaxation). 2. Right ventricular systolic function is normal. The right ventricular size is normal. 3. The mitral valve is normal in structure. Trivial mitral valve regurgitation. No evidence of mitral stenosis. 4. The aortic valve is normal in structure. Aortic valve regurgitation is not visualized. No aortic stenosis is present. 5. The inferior vena cava is normal in size with greater than 50% respiratory variability, suggesting right atrial pressure of 3 mmHg.  Comparison(s): Prior images reviewed side by side. The left ventricular function has improved. Echopcardiogram done 05/31/20 showed an Ef of 45-50%.  FINDINGS Left Ventricle: Left ventricular ejection fraction, by estimation, is 60 to 65%. The left ventricle has normal function. The left ventricle has no regional wall motion abnormalities. Global longitudinal strain performed but not reported based on interpreter judgement due to suboptimal tracking. The left ventricular internal cavity size was normal in size. There is mild left ventricular hypertrophy. Left ventricular diastolic parameters are consistent with Grade I diastolic dysfunction (impaired relaxation).  Right Ventricle: The right ventricular size is normal. No increase in right ventricular wall thickness. Right ventricular systolic function is normal.  Left Atrium: Left atrial size was normal in size.  Right Atrium: Right atrial size was normal in size.  Pericardium: There is no evidence of pericardial effusion.  Mitral Valve: The mitral valve is normal in structure. Trivial mitral valve regurgitation. No evidence of mitral valve stenosis.  Tricuspid Valve: The tricuspid valve is normal in structure. Tricuspid valve regurgitation is not demonstrated. No evidence of tricuspid stenosis.  Aortic Valve: The aortic  valve is normal in structure. Aortic valve regurgitation is not visualized. No aortic stenosis is present.  Pulmonic Valve: The pulmonic valve was normal in structure. Pulmonic valve regurgitation is not visualized. No evidence of pulmonic stenosis.  Aorta: The aortic root is normal in size and structure.  Venous: The inferior vena cava is normal in size with greater than 50% respiratory variability, suggesting right atrial pressure of 3 mmHg.  IAS/Shunts: No atrial level shunt detected by color flow Doppler.   LEFT VENTRICLE PLAX 2D LVIDd:         4.52 cm      Diastology LVIDs:         2.30 cm      LV e' medial:    6.53 cm/s LV PW:         1.37 cm      LV E/e' medial:  12.3 LV IVS:        1.16 cm      LV e' lateral:   8.49 cm/s LVOT diam:     2.20  cm      LV E/e' lateral: 9.4 LV SV:         77 LV SV Index:   32 LVOT Area:     3.80 cm  LV Volumes (MOD) LV vol d, MOD A2C: 115.0 ml LV vol d, MOD A4C: 87.1 ml LV vol s, MOD A2C: 32.1 ml LV vol s, MOD A4C: 24.2 ml LV SV MOD A2C:     82.9 ml LV SV MOD A4C:     87.1 ml LV SV MOD BP:      71.9 ml  RIGHT VENTRICLE RV S prime:     13.10 cm/s TAPSE (M-mode): 3.5 cm  LEFT ATRIUM             Index        RIGHT ATRIUM           Index LA diam:        3.90 cm 1.64 cm/m   RA Area:     12.10 cm LA Vol (A2C):   64.2 ml 27.03 ml/m  RA Volume:   26.70 ml  11.24 ml/m LA Vol (A4C):   67.9 ml 28.59 ml/m LA Biplane Vol: 70.8 ml 29.81 ml/m AORTIC VALVE LVOT Vmax:   112.00 cm/s LVOT Vmean:  76.000 cm/s LVOT VTI:    0.203 m  AORTA Ao Root diam: 3.70 cm Ao Asc diam:  3.60 cm  MITRAL VALVE MV Area (PHT): 3.76 cm    SHUNTS MV Decel Time: 202 msec    Systemic VTI:  0.20 m MR Peak grad: 12.1 mmHg    Systemic Diam: 2.20 cm MR Vmax:      174.00 cm/s MV E velocity: 80.05 cm/s MV A velocity: 73.50 cm/s MV E/A ratio:  1.09  Donato Schultz MD Electronically signed by Donato Schultz MD Signature Date/Time: 04/28/2022/4:08:31 PM    Final           ______________________________________________________________________________________________      Risk Assessment/Calculations:            Physical Exam:   VS:  BP (!) 134/94 (BP Location: Left Arm, Patient Position: Sitting)   Pulse 92   Ht 6' (1.829 m)   Wt 252 lb (114.3 kg)   SpO2 97%   BMI 34.18 kg/m    Wt Readings from Last 3 Encounters:  01/07/24 252 lb (114.3 kg)  12/20/23 247 lb (112 kg)  06/27/23 228 lb (103.4 kg)    GEN: Well nourished, well developed in no acute distress NECK: No JVD; No carotid bruits CARDIAC: RRR, no murmurs, rubs, gallops RESPIRATORY:  Clear to auscultation without rales, wheezing or rhonchi  ABDOMEN: Soft, non-tender, non-distended EXTREMITIES:  No edema; No deformity   ASSESSMENT AND PLAN: .     Chronic Systolic Heart Failure with improved EF Chronic systolic heart failure with reduced ejection fraction (EF 45-50%) improved to 60-65% on last echo.  - Continue carvedilol 25 mg twice a day.  Hypertension Hypertension better controlled with increased carvedilol and added irbesartan. Blood pressure in 130s, borderline high. Clonidine discontinued due to rebound hypertension risk. - Increase irbesartan to 300 mg daily. - Refill hydralazine. - Order basic metabolic panel in 2-3 weeks to monitor potassium levels due to irbesartan.  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 Regular monitoring required to prevent further dilatation. - Repeat imaging in 12 months.        Dispo: follow up with Dr. Antoine Poche in 6-9 month  Signed,  Azalee Course, [place]

## 2024-01-16 ENCOUNTER — Ambulatory Visit: Payer: Commercial Managed Care - PPO | Admitting: Cardiology

## 2024-05-22 ENCOUNTER — Telehealth: Payer: Self-pay | Admitting: Orthopedic Surgery

## 2024-05-22 NOTE — Telephone Encounter (Signed)
 IC patient,lmvm for patient to [facility]. Patient submitted a [employer] form, last seen 07/23/2023

## 2024-08-25 ENCOUNTER — Encounter: Payer: Self-pay | Admitting: Radiology
